# Patient Record
Sex: Male | Born: 1965 | Race: White | Hispanic: No | Smoking: Current every day smoker
Health system: Southern US, Community
[De-identification: ages and names within clinical notes are randomized; demographics above are authoritative.]

## PROBLEM LIST (undated history)

## (undated) ENCOUNTER — Inpatient Hospital Stay: Admission: EM | Payer: Self-pay | Source: Home / Self Care

## (undated) DIAGNOSIS — F419 Anxiety disorder, unspecified: Secondary | ICD-10-CM

## (undated) DIAGNOSIS — I2510 Atherosclerotic heart disease of native coronary artery without angina pectoris: Secondary | ICD-10-CM

## (undated) DIAGNOSIS — F32A Depression, unspecified: Secondary | ICD-10-CM

## (undated) DIAGNOSIS — I10 Essential (primary) hypertension: Secondary | ICD-10-CM

## (undated) HISTORY — DX: Depression, unspecified: F32.A

## (undated) HISTORY — DX: Essential (primary) hypertension: I10

---

## 2007-01-18 ENCOUNTER — Emergency Department (HOSPITAL_COMMUNITY): Admission: EM | Admit: 2007-01-18 | Discharge: 2007-01-18 | Payer: Self-pay | Admitting: Emergency Medicine

## 2012-04-25 NOTE — H&P (Signed)
  Assessment  . Peritonsillar abscess   (475) Discussed  History of what sounds like recurrent peritonsillar abscesses. The recommendation for that is tonsillectomy. We discussed the nature of the surgery and the recovery period. He will think about this and will let us know. Strongly recommend he stopped smoking. Reason For Visit  Levi Johnston is here today at the kind request of Lerry Liner for consultation and opinion for throat issues. HPI  History of what sounds like recurring peritonsillar abscess. One time he had it drained surgically and another time it drained spontaneously. He had a couple other episodes where he was treated before he got bad enough to drain. His last one was a couple of weeks ago. He smokes up to 2 packs per day. He doesn't really drink. Allergies  No Known Drug Allergies. Current Meds  No Reported Medications;; RPT. Family Hx  Family history of Cancer. Personal Hx  Behavioral: Current every day smoker   (305.1). Current Smoker (305.1) Never Drank Alcohol. ROS  Systemic: Fever. Otolaryngeal: Purulent nasal discharge, nasal passage blockage, and sore throat. Gastrointestinal: Dysphagia. 12 system ROS was obtained and reviewed on the Health Maintenance form dated today.  Positive responses are shown above.  If the symptom is not checked, the patient has denied it. Vital Signs   Recorded by [facility] on 06 Apr 2012 02:17 PM BP:100/60,  Height: 71 in, Weight: 185 lb, BMI: 25.8 kg/m2,  BSA Calculated: 2.04 ,  BMI Calculated: 25.80. Physical Exam  APPEARANCE: Well developed, well nourished, in no acute distress.  Normal affect, in a pleasant mood.  Oriented to time, place and person. COMMUNICATION: Normal voice   HEAD & FACE:  No scars, lesions or masses of head and face.  Sinuses nontender to palpation.  Salivary glands without mass or tenderness.  Facial strength symmetric.  No facial lesion, scars, or mass. EYES: EOMI with normal primary gaze  alignment. Visual acuity grossly intact.  PERRLA EXTERNAL EAR & NOSE: No scars, lesions or masses  EAC & TYMPANIC MEMBRANE:  EAC shows no obstructing lesions or debris and tympanic membranes are normal bilaterally with good movement to insufflation. GROSS HEARING: Normal   TMJ:  Nontender  INTRANASAL EXAM: No polyps or purulence.  NASOPHARYNX: Normal, without lesions. LIPS, TEETH & GUMS: No lip lesions, normal dentition and normal gums. ORAL CAVITY/OROPHARYNX:  Oral mucosa moist without lesion or asymmetry of the palate, tongue, tonsil or posterior pharynx. NECK:  Supple without adenopathy or mass. THYROID:  Normal with no masses palpable.  NEUROLOGIC:  No gross CN deficits. No nystagmus noted.   LYMPHATIC:  No enlarged nodes palpable.

## 2012-04-30 ENCOUNTER — Encounter (HOSPITAL_BASED_OUTPATIENT_CLINIC_OR_DEPARTMENT_OTHER): Payer: Self-pay | Admitting: *Deleted

## 2012-05-03 ENCOUNTER — Ambulatory Visit (HOSPITAL_BASED_OUTPATIENT_CLINIC_OR_DEPARTMENT_OTHER)
Admission: RE | Admit: 2012-05-03 | Discharge: 2012-05-03 | Disposition: A | Discharge status: Home / Self Care | Payer: 59 | Source: Ambulatory Visit | Attending: Otolaryngology | Admitting: Otolaryngology

## 2012-05-03 ENCOUNTER — Encounter (HOSPITAL_BASED_OUTPATIENT_CLINIC_OR_DEPARTMENT_OTHER): Payer: Self-pay | Admitting: Anesthesiology

## 2012-05-03 ENCOUNTER — Encounter (HOSPITAL_BASED_OUTPATIENT_CLINIC_OR_DEPARTMENT_OTHER)
Admission: RE | Disposition: A | Discharge status: Home / Self Care | Payer: Self-pay | Source: Ambulatory Visit | Attending: Otolaryngology

## 2012-05-03 ENCOUNTER — Ambulatory Visit (HOSPITAL_BASED_OUTPATIENT_CLINIC_OR_DEPARTMENT_OTHER): Payer: 59 | Admitting: Anesthesiology

## 2012-05-03 ENCOUNTER — Encounter (HOSPITAL_BASED_OUTPATIENT_CLINIC_OR_DEPARTMENT_OTHER): Payer: Self-pay | Admitting: *Deleted

## 2012-05-03 DIAGNOSIS — J3501 Chronic tonsillitis: Secondary | ICD-10-CM

## 2012-05-03 HISTORY — PX: TONSILLECTOMY: SHX5217

## 2012-05-03 LAB — POCT HEMOGLOBIN-HEMACUE: Hemoglobin: 17.1 g/dL — ABNORMAL HIGH (ref 13.0–17.0)

## 2012-05-03 SURGERY — TONSILLECTOMY
Anesthesia: General | Site: Throat | Wound class: Clean Contaminated

## 2012-05-03 MED ORDER — LIDOCAINE HCL (CARDIAC) 10 MG/ML IV SOLN
INTRAVENOUS | Status: DC | PRN
  Administered 2012-05-03: 70 mg via INTRAVENOUS

## 2012-05-03 MED ORDER — SUCCINYLCHOLINE CHLORIDE 20 MG/ML IJ SOLN
INTRAMUSCULAR | Status: DC | PRN
  Administered 2012-05-03: 100 mg via INTRAVENOUS

## 2012-05-03 MED ORDER — DEXAMETHASONE SODIUM PHOSPHATE 4 MG/ML IJ SOLN
INTRAMUSCULAR | Status: DC | PRN
  Administered 2012-05-03: 10 mg via INTRAVENOUS

## 2012-05-03 MED ORDER — HYDROCODONE-ACETAMINOPHEN 7.5-500 MG/15ML PO SOLN: 15.0000 mL | Freq: Four times a day (QID) | ORAL | Status: AC | PRN

## 2012-05-03 MED ORDER — DEXAMETHASONE SODIUM PHOSPHATE 10 MG/ML IJ SOLN: 10.0000 mg | INTRAMUSCULAR | Status: DC

## 2012-05-03 MED ORDER — METOCLOPRAMIDE HCL 5 MG/ML IJ SOLN
INTRAMUSCULAR | Status: DC | PRN
  Administered 2012-05-03: 10 mg via INTRAVENOUS

## 2012-05-03 MED ORDER — HYDROMORPHONE HCL PF 1 MG/ML IJ SOLN
0.2500 mg | INTRAMUSCULAR | Status: DC | PRN
  Administered 2012-05-03 (×2): 0.5 mg via INTRAVENOUS

## 2012-05-03 MED ORDER — MIDAZOLAM HCL 5 MG/5ML IJ SOLN
INTRAMUSCULAR | Status: DC | PRN
  Administered 2012-05-03: 2 mg via INTRAVENOUS

## 2012-05-03 MED ORDER — HYDROCODONE-ACETAMINOPHEN 7.5-500 MG/15ML PO SOLN
10.0000 mL | ORAL | Status: DC | PRN
  Administered 2012-05-03: 15 mL via ORAL

## 2012-05-03 MED ORDER — PROPOFOL 10 MG/ML IV EMUL
INTRAVENOUS | Status: DC | PRN
  Administered 2012-05-03: 170 mg via INTRAVENOUS

## 2012-05-03 MED ORDER — PROMETHAZINE HCL 25 MG PO TABS: 25.0000 mg | ORAL_TABLET | Freq: Four times a day (QID) | ORAL | Status: DC | PRN

## 2012-05-03 MED ORDER — DEXTROSE-NACL 5-0.9 % IV SOLN
INTRAVENOUS | Status: DC
  Administered 2012-05-03: 11:00:00 via INTRAVENOUS

## 2012-05-03 MED ORDER — 0.9 % SODIUM CHLORIDE (POUR BTL) OPTIME
TOPICAL | Status: DC | PRN
  Administered 2012-05-03: 1000 mL

## 2012-05-03 MED ORDER — PROMETHAZINE HCL 25 MG RE SUPP: 25.0000 mg | Freq: Four times a day (QID) | RECTAL | Status: DC | PRN

## 2012-05-03 MED ORDER — PHENOL 1.4 % MT LIQD
1.0000 | OROMUCOSAL | Status: DC | PRN
  Administered 2012-05-03: 1 via OROMUCOSAL

## 2012-05-03 MED ORDER — LACTATED RINGERS IV SOLN
INTRAVENOUS | Status: DC
  Administered 2012-05-03 (×3): via INTRAVENOUS

## 2012-05-03 MED ORDER — FENTANYL CITRATE 0.05 MG/ML IJ SOLN
INTRAMUSCULAR | Status: DC | PRN
  Administered 2012-05-03 (×2): 50 ug via INTRAVENOUS
  Administered 2012-05-03: 100 ug via INTRAVENOUS

## 2012-05-03 MED ORDER — ONDANSETRON HCL 4 MG/2ML IJ SOLN
INTRAMUSCULAR | Status: DC | PRN
  Administered 2012-05-03: 4 mg via INTRAVENOUS

## 2012-05-03 SURGICAL SUPPLY — 27 items
CANISTER SUCTION 1200CC (MISCELLANEOUS) ×2 IMPLANT
CATH ROBINSON RED A/P 12FR (CATHETERS) ×2 IMPLANT
CLOTH BEACON ORANGE TIMEOUT ST (SAFETY) ×2 IMPLANT
COAGULATOR SUCT SWTCH 10FR 6 (ELECTROSURGICAL) IMPLANT
COVER MAYO STAND STRL (DRAPES) ×2 IMPLANT
ELECT COATED BLADE 2.86 ST (ELECTRODE) ×2 IMPLANT
ELECT REM PT RETURN 9FT ADLT (ELECTROSURGICAL) ×2
ELECT REM PT RETURN 9FT PED (ELECTROSURGICAL)
ELECTRODE REM PT RETRN 9FT PED (ELECTROSURGICAL) IMPLANT
ELECTRODE REM PT RTRN 9FT ADLT (ELECTROSURGICAL) IMPLANT
GAUZE SPONGE 4X4 12PLY STRL LF (GAUZE/BANDAGES/DRESSINGS) ×2 IMPLANT
GLOVE BIO SURGEON STRL SZ 6.5 (GLOVE) ×1 IMPLANT
GLOVE ECLIPSE 7.5 STRL STRAW (GLOVE) ×2 IMPLANT
GOWN PREVENTION PLUS XLARGE (GOWN DISPOSABLE) ×2 IMPLANT
MARKER SKIN DUAL TIP RULER LAB (MISCELLANEOUS) IMPLANT
NS IRRIG 1000ML POUR BTL (IV SOLUTION) ×2 IMPLANT
PENCIL FOOT CONTROL (ELECTRODE) ×2 IMPLANT
SHEET MEDIUM DRAPE 40X70 STRL (DRAPES) ×2 IMPLANT
SOLUTION BUTLER CLEAR DIP (MISCELLANEOUS) IMPLANT
SPONGE TONSIL 1 RF SGL (DISPOSABLE) IMPLANT
SPONGE TONSIL 1.25 RF SGL STRG (GAUZE/BANDAGES/DRESSINGS) IMPLANT
SYR BULB 3OZ (MISCELLANEOUS) ×2 IMPLANT
TOWEL OR 17X24 6PK STRL BLUE (TOWEL DISPOSABLE) ×2 IMPLANT
TUBE CONNECTING 20X1/4 (TUBING) ×2 IMPLANT
TUBE SALEM SUMP 12R W/ARV (TUBING) IMPLANT
TUBE SALEM SUMP 16 FR W/ARV (TUBING) ×1 IMPLANT
WATER STERILE IRR 1000ML POUR (IV SOLUTION) ×1 IMPLANT

## 2012-05-03 NOTE — Anesthesia Postprocedure Evaluation (Signed)
  Anesthesia Post-op Note  Patient: Levi Johnston  Procedure(s) Performed: Procedure(s) (LRB): TONSILLECTOMY (N/A)  Patient Location: PACU  Anesthesia Type: General  Level of Consciousness: awake  Airway and Oxygen Therapy: Patient Spontanous Breathing and aerosol face mask  Post-op Pain: mild  Post-op Assessment: Post-op Vital signs reviewed, Patient's Cardiovascular Status Stable, Respiratory Function Stable, Patent Airway and No signs of Nausea or vomiting  Post-op Vital Signs: Reviewed and stable  Complications: No apparent anesthesia complications

## 2012-05-03 NOTE — Op Note (Signed)
05/03/2012  8:15 AM  PATIENT:  Levi Johnston  46 y.o. male  PRE-OPERATIVE DIAGNOSIS:  CHRONIC TONSILITIS  POST-OPERATIVE DIAGNOSIS:  CHRONIC TONSILITIS  PROCEDURE:  Procedure(s): TONSILLECTOMY  SURGEON:  Surgeon(s): Serena Colonel, MD  ANESTHESIA:   general  COUNTS:  YES   DICTATION: The patient was taken to the operating room and placed on the operating table in the supine position. Following induction of general endotracheal anesthesia, the table was turned and the patient was draped in a standard fashion. A Crowe-Davis mouthgag was inserted into the oral cavity and used to retract the tongue and mandible, then attached to the Mayo stand.  The tonsillectomy was then performed using electrocautery dissection, carefully dissecting the avascular plane between the capsule and constrictor muscles. The tonsils were large, deeply cryptic with debris present. There were also severely fibrosed to the constrictor muscles.Cautery was used for completion of hemostasis. The tonsils were sent for pathologic evaluation given the patient's age, the smoking status and the abnormal findings.  The pharynx was irrigated with saline and suctioned. An oral gastric tube was used to aspirate the contents of the stomach. The patient was then awakened from anesthesia and transferred to PACU in stable condition.   PATIENT DISPOSITION:  PACU - hemodynamically stable.

## 2012-05-03 NOTE — Anesthesia Preprocedure Evaluation (Signed)
Anesthesia Evaluation  Patient identified by MRN, date of birth, ID band Patient awake    Reviewed: Allergy & Precautions, H&P , NPO status , Patient's Chart, lab work & pertinent test results  Airway Mallampati: II TM Distance: >3 FB Neck ROM: Full    Dental No notable dental hx. (+) Teeth Intact and Dental Advisory Given   Pulmonary neg pulmonary ROS,  breath sounds clear to auscultation  Pulmonary exam normal       Cardiovascular negative cardio ROS  Rhythm:Regular Rate:Normal     Neuro/Psych negative neurological ROS  negative psych ROS   GI/Hepatic negative GI ROS, Neg liver ROS,   Endo/Other  negative endocrine ROS  Renal/GU negative Renal ROS  negative genitourinary   Musculoskeletal   Abdominal   Peds  Hematology negative hematology ROS (+)   Anesthesia Other Findings   Reproductive/Obstetrics negative OB ROS                           Anesthesia Physical Anesthesia Plan  ASA: I  Anesthesia Plan: General   Post-op Pain Management:    Induction: Intravenous  Airway Management Planned: Oral ETT  Additional Equipment:   Intra-op Plan:   Post-operative Plan: Extubation in OR  Informed Consent: I have reviewed the patients History and Physical, chart, labs and discussed the procedure including the risks, benefits and alternatives for the proposed anesthesia with the patient or authorized representative who has indicated his/her understanding and acceptance.   Dental advisory given  Plan Discussed with: CRNA  Anesthesia Plan Comments:         Anesthesia Quick Evaluation  

## 2012-05-03 NOTE — Interval H&P Note (Signed)
History and Physical Interval Note:  05/03/2012 7:26 AM  Levi Johnston  has presented today for surgery, with the diagnosis of CHRONIC TONSILITIS  The various methods of treatment have been discussed with the patient and family. After consideration of risks, benefits and other options for treatment, the patient has consented to  Procedure(s) (LRB): TONSILLECTOMY (N/A) as a surgical intervention .  The patient's history has been reviewed, patient examined, no change in status, stable for surgery.  I have reviewed the patient's chart and labs.  Questions were answered to the patient's satisfaction.     Devoiry Corriher

## 2012-05-03 NOTE — Progress Notes (Signed)
Attempted room air with oxygen sat's running in 90-91 range.  Patient placed on 2 liters oxygen.

## 2012-05-03 NOTE — Anesthesia Procedure Notes (Signed)
Procedure Name: Intubation Date/Time: 05/03/2012 7:45 AM Performed by: Gar Gibbon Pre-anesthesia Checklist: Patient identified, Emergency Drugs available, Suction available and Patient being monitored Patient Re-evaluated:Patient Re-evaluated prior to inductionOxygen Delivery Method: Circle System Utilized Preoxygenation: Pre-oxygenation with 100% oxygen Intubation Type: IV induction Ventilation: Mask ventilation without difficulty Laryngoscope Size: Miller and 3 Grade View: Grade II Tube type: Oral Tube size: 7.0 mm Number of attempts: 1 Airway Equipment and Method: stylet and oral airway Placement Confirmation: ETT inserted through vocal cords under direct vision,  positive ETCO2 and breath sounds checked- equal and bilateral Tube secured with: Tape Dental Injury: Teeth and Oropharynx as per pre-operative assessment

## 2012-05-03 NOTE — Progress Notes (Signed)
Dr. Sampson Goon notified of patient's oxygen saturation and need for 2 liters nasal cannula.  Patient to be transferred to [facility] on nasal cannula.

## 2012-05-03 NOTE — Transfer of Care (Signed)
Immediate Anesthesia Transfer of Care Note  Patient: Levi Johnston  Procedure(s) Performed: Procedure(s) (LRB): TONSILLECTOMY (N/A)  Patient Location: PACU  Anesthesia Type: General  Level of Consciousness: sedated and patient cooperative  Airway & Oxygen Therapy: aerosol face mask  Post-op Assessment: Report given to PACU RN and Post -op Vital signs reviewed and stable  Post vital signs: Reviewed and stable  Complications: No apparent anesthesia complications

## 2012-05-04 ENCOUNTER — Encounter (HOSPITAL_BASED_OUTPATIENT_CLINIC_OR_DEPARTMENT_OTHER): Payer: Self-pay | Admitting: Otolaryngology

## 2017-09-13 ENCOUNTER — Encounter (HOSPITAL_COMMUNITY): Payer: Self-pay | Admitting: Emergency Medicine

## 2017-09-13 ENCOUNTER — Emergency Department (HOSPITAL_COMMUNITY): Payer: Medicaid Other

## 2017-09-13 ENCOUNTER — Other Ambulatory Visit: Payer: Self-pay

## 2017-09-13 ENCOUNTER — Emergency Department (HOSPITAL_COMMUNITY)
Admission: EM | Admit: 2017-09-13 | Discharge: 2017-09-13 | Disposition: A | Discharge status: Home / Self Care | Payer: Medicaid Other | Attending: Physician Assistant | Admitting: Physician Assistant

## 2017-09-13 DIAGNOSIS — F17210 Nicotine dependence, cigarettes, uncomplicated: Secondary | ICD-10-CM | POA: Diagnosis not present

## 2017-09-13 DIAGNOSIS — R1032 Left lower quadrant pain: Secondary | ICD-10-CM | POA: Insufficient documentation

## 2017-09-13 DIAGNOSIS — F17228 Nicotine dependence, chewing tobacco, with other nicotine-induced disorders: Secondary | ICD-10-CM | POA: Insufficient documentation

## 2017-09-13 DIAGNOSIS — R1030 Lower abdominal pain, unspecified: Secondary | ICD-10-CM | POA: Diagnosis present

## 2017-09-13 MED ORDER — OXYCODONE-ACETAMINOPHEN 5-325 MG PO TABS
1.0000 | ORAL_TABLET | Freq: Once | ORAL | Status: AC
  Administered 2017-09-13: 1 via ORAL
  Filled 2017-09-13: qty 1

## 2017-09-13 MED ORDER — OXYCODONE-ACETAMINOPHEN 5-325 MG PO TABS
1.0000 | ORAL_TABLET | Freq: Once | ORAL | Status: AC
Start: 2017-09-13 — End: 2017-09-13
  Administered 2017-09-13: 1 via ORAL
  Filled 2017-09-13: qty 1

## 2017-09-13 MED ORDER — SULFAMETHOXAZOLE-TRIMETHOPRIM 800-160 MG PO TABS: 1.0000 | ORAL_TABLET | Freq: Two times a day (BID) | ORAL | 0 refills | Status: AC

## 2017-09-13 MED ORDER — LIDOCAINE HCL (PF) 1 % IJ SOLN
5.0000 mL | Freq: Once | INTRAMUSCULAR | Status: AC
  Administered 2017-09-13: 5 mL via INTRADERMAL
  Filled 2017-09-13: qty 5

## 2017-09-13 NOTE — ED Notes (Signed)
Patient able to ambulate independently  

## 2017-09-13 NOTE — ED Provider Notes (Signed)
[facility] EMERGENCY [facility]PARTMENT Provider Note   CSN: [id number] Arrival date & time: 09/13/17  [id number]     History   Chief Complaint Chief Complaint  Patient presents with  . Groin Pain    HPI Levi Johnston is a 51 y.o. male.  HPI   The patient is a 51 year old male presenting with a left groin pain.  Patient reports there is been some redness there and swelling.  He reports it hurts when he moves.  Does not get worse with increased intra-abdominal pressure such as coughing or straining.  Patient has noticed that steadily worse, it does not hurt so bad when he is not moving or he is not touching it.  Patient is concerned over a strangled hernia.  No urinary or bowel complaints.  No fevers.  History reviewed. No pertinent past medical history.  There are no active problems to display for this patient.   Past Surgical History:  Procedure Laterality Date  . TONSILLECTOMY  05/03/2012   Procedure: TONSILLECTOMY;  Surgeon: Serena ColonelJefry Rosen, MD;  Location: [facility];  Service: ENT;  Laterality: N/A;       Home Medications    Prior to Admission medications   Medication Sig Start Date End Date Taking? Authorizing Provider  promethazine (PHENERGAN) 25 MG suppository Place 1 suppository (25 mg total) rectally every 6 (six) hours as needed for nausea. 05/03/12 05/10/12  Serena Colonelosen, Jefry, MD    Family History No family history on file.  Social History Social History   Tobacco Use  . Smoking status: Current Every Day Smoker    Packs/day: 1.00    Types: Cigarettes  . Smokeless tobacco: Current User  Substance Use Topics  . Alcohol use: Yes    Comment: drinks very rare  . Drug use: No     Allergies   Patient has no known allergies.   Review of Systems Review of Systems  Constitutional: Negative for activity change.  Respiratory: Negative for shortness of breath.   Cardiovascular: Negative for chest pain.  Gastrointestinal: Negative for  abdominal pain.  Skin: Positive for rash.  All other systems reviewed and are negative.    Physical Exam Updated Vital Signs BP (!) 150/104 (BP Location: Right Arm)   Pulse (!) 106   Temp 98 F (36.7 C) (Oral)   Ht 6' (1.829 m)   Wt 90.7 kg (200 lb)   SpO2 99%   BMI 27.12 kg/m   Physical Exam  Constitutional: He is oriented to person, place, and time. He appears well-nourished.  HENT:  Head: Normocephalic.  Eyes: Conjunctivae are normal.  Cardiovascular: Normal rate and regular rhythm.  No murmur heard. Pulmonary/Chest: Effort normal and breath sounds normal. No respiratory distress.  Abdominal: Soft. He exhibits no distension. There is no tenderness.  Left groin with erythema, swelling.  Indurated.  No pain to the scrotum.  No pain with near internal ring of inguinal hernia.  Neurological: He is oriented to person, place, and time.  Skin: Skin is warm and dry. He is not diaphoretic.  Psychiatric: He has a normal mood and affect. His behavior is normal.     ED Treatments / Results  Labs (all labs ordered are listed, but only abnormal results are displayed) Labs Reviewed - No data to display  EKG  EKG Interpretation None       Radiology No results found.  Procedures Procedures (including critical care time)  Medications Ordered in ED Medications  oxyCODONE-acetaminophen (PERCOCET/ROXICET) 5-325 MG per  tablet 1 tablet (not administered)  lidocaine (PF) (XYLOCAINE) 1 % injection 5 mL (not administered)  oxyCODONE-acetaminophen (PERCOCET/ROXICET) 5-325 MG per tablet 1 tablet (1 tablet Oral Given 09/13/17 0809)     Initial Impression / Assessment and Plan / ED Course  I have reviewed the triage vital signs and the nursing notes.  Pertinent labs & imaging results that were available during my care of the patient were reviewed by me and considered in my medical decision making (see chart for details).     EMERGENCY DEPARTMENT US SOFT TISSUE  INTERPRETATION "Study: Limited Soft Tissue Ultrasound"  INDICATIONS: Soft tissue infection Multiple views of the body part were obtained in real-time with a multi-frequency linear probe PERFORMED BY:  Myself IMAGES ARCHIVED?: Yes SIDE:Left BODY PART:Abdominal wall and groin FINDINGS: Cellulitis present INTERPRETATION:  Abcess present  The patient is a 51 year old male presenting with a left groin pain.  Patient reports there is been some redness there and swelling.  He reports it hurts when he moves.  Does not get worse with increased intra-abdominal pressure such as coughing or straining.  Patient has noticed that steadily worse, it does not hurt so bad when he is not moving or he is not touching it.  Patient is concerned over a strangled hernia.  No urinary or bowel complaints.  No fevers.  1:16 PM It is difficult to tell by physical exam whether this is just an abscess/overlying cellulitis versus a has a hernia component to it.  Difficult to tell because patient's sensitivity to touch.  Ultrasound shows cobblestoning which could be present with a infected strangulated hernia.  Will get CT.\\ 3:22 PM CT showed cellulitis.  No hernia in that area.  It showed no clear pocket of infection.  However on physical exam and very much feels like there is a abscess.  I&D performed with copious amounts of purulent milked.  INCISION AND DRAINAGE Performed by: Arlana Hoveourteney L Emeline Simpson Consent: Verbal consent obtained. Risks and benefits: risks, benefits and alternatives were discussed Type: abscess  Body area: L groin  Anesthesia: local infiltration  Incision was made with a scalpel.  Local anesthetic: lidocaine 1 w epinephrine  Anesthetic total: 9 ml  Complexity: complex Blunt dissection to break up loculations  Drainage: purulent  Drainage amount: tons  Patient tolerance: Patient tolerated the procedure well with no immediate complications.     Final Clinical Impressions(s) / ED  Diagnoses   Final diagnoses:  None    ED Discharge Orders    None       Abelino DerrickMackuen, Cynthie Garmon Lyn, MD 09/13/17 [id number]

## 2017-09-13 NOTE — ED Notes (Signed)
EDP at bedside at this time.  

## 2017-09-13 NOTE — ED Notes (Signed)
Pt transported to CT ?

## 2017-09-13 NOTE — ED Triage Notes (Signed)
Pt. Stated, I've had left groin pain like having a hernia for 10 days. Its really painful

## 2017-09-13 NOTE — Discharge Instructions (Signed)
Use warm compresses twice daily for the next several days.  Should use antibiotics and return if your redness is worsening.

## 2018-04-12 ENCOUNTER — Emergency Department (HOSPITAL_COMMUNITY)
Admission: EM | Admit: 2018-04-12 | Discharge: 2018-04-12 | Disposition: A | Discharge status: Home / Self Care | Payer: Medicaid Other | Attending: Emergency Medicine | Admitting: Emergency Medicine

## 2018-04-12 ENCOUNTER — Other Ambulatory Visit: Payer: Self-pay

## 2018-04-12 ENCOUNTER — Encounter (HOSPITAL_COMMUNITY): Payer: Self-pay | Admitting: Emergency Medicine

## 2018-04-12 ENCOUNTER — Emergency Department (HOSPITAL_COMMUNITY): Payer: Medicaid Other

## 2018-04-12 DIAGNOSIS — R591 Generalized enlarged lymph nodes: Secondary | ICD-10-CM | POA: Insufficient documentation

## 2018-04-12 DIAGNOSIS — F17210 Nicotine dependence, cigarettes, uncomplicated: Secondary | ICD-10-CM | POA: Diagnosis not present

## 2018-04-12 DIAGNOSIS — R221 Localized swelling, mass and lump, neck: Secondary | ICD-10-CM | POA: Diagnosis not present

## 2018-04-12 DIAGNOSIS — R599 Enlarged lymph nodes, unspecified: Secondary | ICD-10-CM | POA: Diagnosis not present

## 2018-04-12 LAB — CBC
HCT: 49.2 % (ref 39.0–52.0)
Hemoglobin: 16.2 g/dL (ref 13.0–17.0)
MCH: 29.1 pg (ref 26.0–34.0)
MCHC: 32.9 g/dL (ref 30.0–36.0)
MCV: 88.5 fL (ref 78.0–100.0)
PLATELETS: 282 10*3/uL (ref 150–400)
RBC: 5.56 MIL/uL (ref 4.22–5.81)
RDW: 14.1 % (ref 11.5–15.5)
WBC: 7.4 10*3/uL (ref 4.0–10.5)

## 2018-04-12 LAB — BASIC METABOLIC PANEL
ANION GAP: 9 (ref 5–15)
BUN: 16 mg/dL (ref 6–20)
CO2: 24 mmol/L (ref 22–32)
Calcium: 9.2 mg/dL (ref 8.9–10.3)
Chloride: 107 mmol/L (ref 98–111)
Creatinine, Ser: 1.16 mg/dL (ref 0.61–1.24)
GLUCOSE: 151 mg/dL — AB (ref 70–99)
Potassium: 3.9 mmol/L (ref 3.5–5.1)
Sodium: 140 mmol/L (ref 135–145)

## 2018-04-12 MED ORDER — IOHEXOL 300 MG/ML  SOLN
75.0000 mL | Freq: Once | INTRAMUSCULAR | Status: AC | PRN
  Administered 2018-04-12: 75 mL via INTRAVENOUS

## 2018-04-12 NOTE — ED Provider Notes (Signed)
[facility] Emergency Department Provider Note MRN:  [id number]  Arrival date & time: 04/13/18     Chief Complaint   Mass   History of Present Illness   Levi Johnston is a 52 y.o. year-old male with a history of tobacco abuse presenting to the ED with chief complaint of neck mass.  Patient explains that 5 days ago while he was shaving he noticed an abnormal contour to the left side of his neck.  Upon palpation he noticed a small mass.  This mass was initially fluctuant but has become more solid over the past 5 days.  Mass associated with some mild tenderness to palpation, described as a dull pain.  Patient denies fevers, night sweats, no increased fatigue, no unintentional weight loss.  Patient's mother died of cancer and patient's brother had a similar nodule in the exact same location and was diagnosed with head neck cancer a year and half ago, and the patient has witnessed his brother struggle with multiple surgeries and chemotherapy.  Review of Systems  A complete 10 system review of systems was obtained and all systems are negative except as noted in the HPI and PMH.   Patient's Health History   History reviewed. No pertinent past medical history.  Past Surgical History:  Procedure Laterality Date  . TONSILLECTOMY  05/03/2012   Procedure: TONSILLECTOMY;  Surgeon: Serena Colonel, MD;  Location: [facility];  Service: ENT;  Laterality: N/A;    No family history on file.  Social History   Socioeconomic History  . Marital status: Divorced    Spouse name: Not on file  . Number of children: Not on file  . Years of education: Not on file  . Highest education level: Not on file  Occupational History  . Not on file  Social Needs  . Financial resource strain: Not on file  . Food insecurity:    Worry: Not on file    Inability: Not on file  . Transportation needs:    Medical: Not on file    Non-medical: Not on file  Tobacco Use  . Smoking status:  Current Every Day Smoker    Packs/day: 1.00    Types: Cigarettes  . Smokeless tobacco: Current User  Substance and Sexual Activity  . Alcohol use: Yes    Comment: drinks very rare  . Drug use: No  . Sexual activity: Not on file    Comment: smokes  1 pack a day  Lifestyle  . Physical activity:    Days per week: Not on file    Minutes per session: Not on file  . Stress: Not on file  Relationships  . Social connections:    Talks on phone: Not on file    Gets together: Not on file    Attends religious service: Not on file    Active member of club or organization: Not on file    Attends meetings of clubs or organizations: Not on file    Relationship status: Not on file  . Intimate partner violence:    Fear of current or ex partner: Not on file    Emotionally abused: Not on file    Physically abused: Not on file    Forced sexual activity: Not on file  Other Topics Concern  . Not on file  Social History Narrative  . Not on file     Physical Exam  Vital Signs and Nursing Notes reviewed Vitals:   04/12/18 1900 04/12/18 2030  BP: Marland Kitchen)  132/98 (!) 159/98  Pulse: 77 77  Resp:    Temp:    SpO2: 99% 98%    CONSTITUTIONAL: Well-appearing, NAD NEURO:  Alert and oriented x 3, no focal deficits EYES:  eyes equal and reactive ENT/NECK:  no LAD, no JVD; 2 to 3 cm semisolid irregular nodule to the left lateral neck soft tissue CARDIO: Regular rate, well-perfused, normal S1 and S2 PULM:  CTAB no wheezing or rhonchi GI/GU:  normal bowel sounds, non-distended, non-tender MSK/SPINE:  No gross deformities, no edema SKIN:  no rash, atraumatic PSYCH:  Appropriate speech and behavior  Diagnostic and Interventional Summary    EKG Interpretation  Date/Time:    Ventricular Rate:    PR Interval:    QRS Duration:   QT Interval:    QTC Calculation:   R Axis:     Text Interpretation:        Labs Reviewed  BASIC METABOLIC PANEL - Abnormal; Notable for the following components:       Result Value   Glucose, Bld 151 (*)    All other components within normal limits  CBC    CT Soft Tissue Neck W Contrast  Final Result      Medications  iohexol (OMNIPAQUE) 300 MG/ML solution 75 mL (75 mLs Intravenous Contrast Given 04/12/18 1927)     Procedures Critical Care  ED Course and Medical Decision Making  I have reviewed the triage vital signs and the nursing notes.  Pertinent labs & imaging results that were available during my care of the patient were reviewed by me and considered in my medical decision making (see below for details). Clinical Course as of Apr 13 21  Mon Apr 12, 2018  [id number] 52 year old male history of tobacco abuse presenting with mass to the left lateral neck, changing in texture from soft to more solid in the past 5 days.  No B symptoms, vital signs stable, afebrile here.  3 to 4 cm lesion with irregular borders, concerning for possible neoplastic lesion.  Will obtain CT imaging today.   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  Discussed findings and imaging with Dr. Annalee GentaShoemaker of ENT, no indication for admission at this time, will see in clinic within the next 2 to 3 days.  Patient informed of the concerning findings and is aware that he needs to [facility] for [facility] this week.After the discussed management above, the patient was determined to be safe for discharge.  The patient was in agreement with this plan and all questions regarding their care were answered.  ED return precautions were discussed and the patient will return to the ED with any significant worsening of condition.   [MB]    Clinical Course User Index [MB] Pilar PlateBero, Elmer SowMichael M, MD     Elmer SowMichael M. Pilar PlateBero, MD [facility] [email or web address]  Final Clinical Impressions(s) / ED Diagnoses     ICD-10-CM   1. Neck mass R22.1   2. Lymphadenopathy R59.1     ED  Discharge Orders    None         Sabas SousBero, Paityn Balsam M, MD 04/13/18 [phone]

## 2018-04-12 NOTE — Discharge Instructions (Addendum)
[facility].  After careful evaluation, we did not find any emergent condition requiring admission or further testing in the hospital.  Your symptoms today seem to be due to an enlarged lymph node that was found to be irregular on the CT and is concerning for an underlying cancer.  Your symptoms and your CT scan were discussed with Dr. Annalee GentaShoemaker, an ENT surgeon.  He and his colleagues would like to see you at [facility] within the next 2 to 3 days.  This clinic is located on [address]  The phone number is [phone].  Please call this number tomorrow morning to schedule an appointment.  Should be expecting your call.  Please return to the Emergency Department if you experience any worsening of your condition.  We encourage you to follow up with a primary care provider.  Thank you for allowing us to be a part of your care.

## 2018-04-12 NOTE — ED Triage Notes (Signed)
Patient complains of painful mass he noticed a few days ago posterior to his left jaw. States it has gotten larger and harder since it appeared. Patient states he is concerned because many members of his family have cancer and he is concerned me may also have cancer. Patient alert, oriented, and in no apparent distress at this time.

## 2018-04-12 NOTE — ED Provider Notes (Signed)
Patient placed in Quick Look pathway, seen and evaluated   Chief Complaint: Neck mass  HPI:   Pt reports over the last few days he has noticed a mass over the left side of the neck that is tender to palpation and seem to gotten a little bit bigger over the past few days.  No sore throat, no difficulty breathing or swallowing.  Reports family history of head and neck cancers.  ROS: + neck mass. -Fevers, chills, trouble breathing or swallowing, sore throat  Physical Exam:   Gen: No distress  Neuro: Awake and Alert  Skin: Warm    Focused Exam: Palpable mass over the left side of the neck minimally tender to palpation, no overlying redness or swelling.  Posterior oropharynx is clear and moist, no stridor tolerating secretions, normal phonation   Initiation of care has begun. The patient has been counseled on the process, plan, and necessity for staying for the completion/evaluation, and the remainder of the medical screening examination    Legrand RamsFord, Levi Kirksey N, PA-C 04/12/18 1435    Shaune PollackIsaacs, Cameron, MD 04/12/18 1650

## 2018-04-12 NOTE — ED Notes (Signed)
Pt stable, ambulatory, states understanding of discharge instructions 

## 2018-04-12 NOTE — ED Notes (Signed)
Called CT and spoke with tomasina regarding time of pt's test that has been pending for over 4 hours

## 2018-04-12 NOTE — ED Notes (Signed)
Patient transported to CT 

## 2018-04-16 DIAGNOSIS — R590 Localized enlarged lymph nodes: Secondary | ICD-10-CM | POA: Diagnosis not present

## 2018-04-27 DIAGNOSIS — R590 Localized enlarged lymph nodes: Secondary | ICD-10-CM | POA: Diagnosis not present

## 2018-05-11 DIAGNOSIS — R590 Localized enlarged lymph nodes: Secondary | ICD-10-CM | POA: Diagnosis not present

## 2018-05-12 NOTE — H&P (Signed)
  Otolaryngology Clinic Note  HPI:    Levi Johnston is a 52 y.o. male patient of Patient Does Not Have Pcp for preoperative evaluation.  Thus far, fine-needle aspiration abscesses x2 failed to show any sign of cancer.  He does continue to smoke.  We will excise the cystic lesion and send it for frozen section.  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  If it is positive, then we should do a panendoscopy looking for the primary, and also a limited neck dissection.  I discussed this in detail including risks and complications.  Questions were answered and informed consent was obtained. PMH/Meds/All/SocHx/FamHx/ROS:   Past Medical History  History reviewed. No pertinent past medical history.    Past Surgical History       Past Surgical History:  Procedure Laterality Date  . NO PAST SURGERIES        No family history of bleeding disorders, wound healing problems or difficulty with anesthesia.   Social History  Social History        Social History  . Marital status: Divorced    Spouse name: N/A  . Number of children: N/A  . Years of education: N/A      Occupational History  . Not on file.        Social History Main Topics  . Smoking status: Current Every Day Smoker    Types: Cigarettes  . Smokeless tobacco: Never Used  . Alcohol use No  . Drug use: No  . Sexual activity: Not on file       Other Topics Concern  . Not on file      Social History Narrative  . No narrative on file       Current Outpatient Prescriptions:  .  HYDROcodone-acetaminophen (NORCO) 5-325 mg per tablet, Take 1-2 tablets by mouth every 4 (four) hours as needed for up to 5 days for Pain., Disp: 24 tablet, Rfl: 0  A complete ROS was performed with pertinent positives/negatives noted in the HPI. The remainder of the ROS are negative.    Physical Exam:    There were no vitals taken for this visit. He is muscular and  healthy.  He smells of tobacco smoke.  Mental status is appropriate.  Tonsils are surgically absent.  There is fullness of the left level 2 neck and no other nodes. Lungs: Clear to auscultation Heart: Regular rate and rhythm without murmurs Abdomen: Soft, active Extremities: Normal configuration Neurologic: Symmetric, grossly intact.       Impression & Plans:   Satisfactory preop check.  Plan: We will proceed as planned.  Send in some hydrocodone.  He will buy some Hibiclens.  I will see him here 2 weeks after surgery.   Fernande BoydenKarol Thaddeus Cheron Coryell, MD  05/11/2018

## 2018-05-14 ENCOUNTER — Encounter (HOSPITAL_COMMUNITY)
Admission: RE | Admit: 2018-05-14 | Discharge: 2018-05-14 | Disposition: A | Discharge status: Home / Self Care | Payer: Medicaid Other | Source: Ambulatory Visit | Attending: Otolaryngology | Admitting: Otolaryngology

## 2018-05-14 ENCOUNTER — Other Ambulatory Visit: Payer: Self-pay

## 2018-05-14 ENCOUNTER — Encounter (HOSPITAL_COMMUNITY): Payer: Self-pay

## 2018-05-14 DIAGNOSIS — F17200 Nicotine dependence, unspecified, uncomplicated: Secondary | ICD-10-CM | POA: Diagnosis not present

## 2018-05-14 DIAGNOSIS — Z01810 Encounter for preprocedural cardiovascular examination: Secondary | ICD-10-CM | POA: Insufficient documentation

## 2018-05-14 DIAGNOSIS — Z01812 Encounter for preprocedural laboratory examination: Secondary | ICD-10-CM | POA: Diagnosis present

## 2018-05-14 DIAGNOSIS — I10 Essential (primary) hypertension: Secondary | ICD-10-CM | POA: Diagnosis not present

## 2018-05-14 DIAGNOSIS — L728 Other follicular cysts of the skin and subcutaneous tissue: Secondary | ICD-10-CM | POA: Diagnosis not present

## 2018-05-14 HISTORY — DX: Anxiety disorder, unspecified: F41.9

## 2018-05-14 LAB — BASIC METABOLIC PANEL
Anion gap: 9 (ref 5–15)
BUN: 18 mg/dL (ref 6–20)
CALCIUM: 9.3 mg/dL (ref 8.9–10.3)
CO2: 25 mmol/L (ref 22–32)
CREATININE: 1.17 mg/dL (ref 0.61–1.24)
Chloride: 104 mmol/L (ref 98–111)
GFR calc non Af Amer: >60 mL/min (ref >60)
Glucose, Bld: 114 mg/dL — ABNORMAL HIGH (ref 70–99)
Potassium: 4.4 mmol/L (ref 3.5–5.1)
Sodium: 138 mmol/L (ref 135–145)

## 2018-05-14 LAB — CBC
HCT: 51.6 % (ref 39.0–52.0)
Hemoglobin: 16.9 g/dL (ref 13.0–17.0)
MCH: 29.5 pg (ref 26.0–34.0)
MCHC: 32.8 g/dL (ref 30.0–36.0)
MCV: 90.1 fL (ref 78.0–100.0)
PLATELETS: 256 10*3/uL (ref 150–400)
RBC: 5.73 MIL/uL (ref 4.22–5.81)
RDW: 14.2 % (ref 11.5–15.5)
WBC: 8.8 10*3/uL (ref 4.0–10.5)

## 2018-05-14 NOTE — Progress Notes (Signed)
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 Pertinent hx includes anxiety.  I was called to see pt during PAT appt due to elevated BP. Check on arrival by automatic cuff 173/144. Manual check after resting 158/96. Review of BP from ED visit 04/12/18 also shows elevated at 159/98. I discussed with pt that he should establish with primary care as he likely has untreated HTN. I also advised that if his pressure is markedly elevated on DOS his procedure may need to be postponed. He verbalized understanding. He also stated he is under a tremendous amount of stress as both his mother and brother died from head and neck CA and he is very worried that his neck mass may represent cancer. He is a single father and says for the last month he has been smoking more and not sleeping much due to stress. He says he has been doing some walking/running lately trying to improve his fitness prior to surgery. He denies any CP with strenuous activity.  I left a message for Dr. Raye SorrowWolicki's assistant to make him aware of pt's untreated HTN.  Anticipate he can proceed with surgery as planned barring acute status change and BP acceptable on DOS.  VS: Pulse 90   Temp 36.8 C   Resp 20   Ht 5\' 11"  (1.803 m)   Wt 94.5 kg   SpO2 99%   BMI 29.07 kg/m   PROVIDERS: Patient, No Pcp Per   LABS: Labs reviewed: Acceptable for surgery. (all labs ordered are listed, but only abnormal results are displayed)  Labs Reviewed  CBC  BASIC METABOLIC PANEL     IMAGES: CT Soft Tissue Neck 04/12/2018: IMPRESSION: Hypodense, enlarged left level 2A cervical lymph node is concerning for necrotic  nodal metastasis from occult head and neck malignancy. Endoscopic assessment is recommended. No primary neoplasm identifiable by imaging.    EKG: 05/14/2018: Normal sinus rhythm. Inferior infarct, age undetermined    CV: N/A  Past Medical History:  Diagnosis Date  . Anxiety     Past Surgical History:  Procedure Laterality Date  . TONSILLECTOMY  05/03/2012   Procedure: TONSILLECTOMY;  Surgeon: Serena ColonelJefry Rosen, MD;  Location: [facility];  Service: ENT;  Laterality: N/A;    MEDICATIONS: No current outpatient medications on file.   No current facility-administered medications for this encounter.     Zannie CoveJames Brennen Gardiner, PA-C [facility]Anesthesiology Phone [phone] 05/14/2018 12:51 PM

## 2018-05-14 NOTE — Pre-Procedure Instructions (Signed)
Randall Colden  05/14/2018      [facility] - [place], [place] - [address] [place] [address] Phone: [phone] Fax: [phone]    Your procedure is scheduled on 05-18-2018 Tuesday .  Report to [facility] Admitting at 6:30 A.M.   Call this number if you have problems the morning of surgery:  [phone]   Remember:  Do not eat  Food or drink liquids after midnight.                        Take these medicines the morning of surgery with A SIP OF WATER none   STOP TAKING ANY ASPIRIN (UNLESS OTHERWISE INSTRUCTED BY YOUR SURGEON),ANTIINFLAMATORIES (IBUPROFEN,ALEVE,MOTRIN,ADVIL,GOODY'S POWDERS),HERBAL SUPPLEMENTS,FISH OIL,AND VITAMINS 5-7 DAYS PRIOR TO SURGERY    Do not wear jewelry  Do not wear lotions, powders, or perfumes, or deodorant.  Do not shave 48 hours prior to surgery.  Men may shave face and neck.   Do not bring valuables to the hospital.   [facility] is not responsible for any belongings or valuables.  Contacts, dentures or bridgework may not be worn into surgery.  Leave your suitcase in the car.  After surgery it may be brought to your room.  For patients admitted to the hospital, discharge time will be determined by your treatment team.  Patients discharged the day of surgery will not be allowed to drive home.    [facility] - Preparing for Surgery  Before surgery, you can play an important role.  Because skin is not sterile, your skin needs to be as free of germs as possible.  You can reduce the number of germs on you skin by washing with CHG (chlorahexidine gluconate) soap before surgery.  CHG is an antiseptic cleaner which kills germs and bonds with the skin to continue killing germs even after washing.  Oral Hygiene is also important in reducing the risk of infection.  Remember to brush your teeth with your regular toothpaste the morning of surgery.  Please DO NOT use if you have  an allergy to CHG or antibacterial soaps.  If your skin becomes reddened/irritated stop using the CHG and inform your nurse when you arrive at Short Stay.  Do not shave (including legs and underarms) for at least 48 hours prior to the first CHG shower.  You may shave your face.  Please follow these instructions carefully:   1.  Shower with CHG Soap the night before surgery and the morning of Surgery.  2.  If you choose to wash your hair, wash your hair first as usual with your normal shampoo.  3.  After you shampoo, rinse your hair and body thoroughly to remove the shampoo. 4.  Use CHG as you would any other liquid soap.  You can apply chg directly to the skin and wash gently with a      scrungie or washcloth.           5.  Apply the CHG Soap to your body ONLY FROM THE NECK DOWN.   Do not use on open wounds or open sores. Avoid contact with your eyes, ears, mouth and genitals (private parts).  Wash genitals (private parts) with your normal soap.  6.  Wash thoroughly, paying special attention to the area where your surgery will be performed.  7.  Thoroughly rinse your body with warm water from the neck down.  8.  DO  NOT shower/wash with your normal soap after using and rinsing off the CHG Soap.  9.  Pat yourself dry with a clean towel.            10.  Wear clean pajamas.            11.  Place clean sheets on your bed the night of your first shower and do not sleep with pets.  Day of Surgery  Do not apply any lotions/deoderants the morning of surgery.   Please wear clean clothes to the hospital/surgery center. Remember to brush your teeth with toothpaste.    Please read over the following fact sheets that you were given. Pain Booklet and Surgical Site Infection Prevention

## 2018-05-14 NOTE — Progress Notes (Addendum)
No PCP   BP elevated on arrival to pre-admission Right arm  173/144 Left arm 162/101 Kelby FamManuel check 158/96  Result called to anesthesia PA ,who is coming to speak with patient.  Pt. Denies any problems with BP in the past.

## 2018-05-17 NOTE — Anesthesia Preprocedure Evaluation (Addendum)
Anesthesia Evaluation  Patient identified by MRN, date of birth, ID band Patient awake    Reviewed: Allergy & Precautions, NPO status , Patient's Chart, lab work & pertinent test results  Airway Mallampati: III  TM Distance: >3 FB Neck ROM: Full    Dental no notable dental hx. (+) Teeth Intact, Dental Advisory Given   Pulmonary Current Smoker,    Pulmonary exam normal breath sounds clear to auscultation       Cardiovascular Exercise Tolerance: Good negative cardio ROS Normal cardiovascular exam Rhythm:Regular Rate:Normal     Neuro/Psych negative neurological ROS  negative psych ROS   GI/Hepatic negative GI ROS, Neg liver ROS,   Endo/Other    Renal/GU      Musculoskeletal negative musculoskeletal ROS (+)   Abdominal   Peds  Hematology   Anesthesia Other Findings   Reproductive/Obstetrics                            Lab Results  Component Value Date   WBC 8.8 05/14/2018   HGB 16.9 05/14/2018   HCT 51.6 05/14/2018   MCV 90.1 05/14/2018   PLT 256 05/14/2018    Anesthesia Physical Anesthesia Plan  ASA: II  Anesthesia Plan: General   Post-op Pain Management:    Induction: Intravenous  PONV Risk Score and Plan:   Airway Management Planned: Oral ETT  Additional Equipment:   Intra-op Plan:   Post-operative Plan: Extubation in OR  Informed Consent: I have reviewed the patients History and Physical, chart, labs and discussed the procedure including the risks, benefits and alternatives for the proposed anesthesia with the patient or authorized representative who has indicated his/her understanding and acceptance.   Dental advisory given  Plan Discussed with: CRNA  Anesthesia Plan Comments:         Anesthesia Quick Evaluation

## 2018-05-18 ENCOUNTER — Encounter (HOSPITAL_COMMUNITY)
Admission: RE | Disposition: A | Discharge status: Home / Self Care | Payer: Self-pay | Source: Ambulatory Visit | Attending: Otolaryngology

## 2018-05-18 ENCOUNTER — Ambulatory Visit (HOSPITAL_COMMUNITY): Payer: Medicaid Other | Admitting: Physician Assistant

## 2018-05-18 ENCOUNTER — Ambulatory Visit (HOSPITAL_COMMUNITY): Payer: Medicaid Other | Admitting: Anesthesiology

## 2018-05-18 ENCOUNTER — Encounter (HOSPITAL_COMMUNITY): Payer: Self-pay | Admitting: Surgery

## 2018-05-18 ENCOUNTER — Other Ambulatory Visit: Payer: Self-pay

## 2018-05-18 ENCOUNTER — Observation Stay (HOSPITAL_COMMUNITY)
Admission: RE | Admit: 2018-05-18 | Discharge: 2018-05-19 | Disposition: A | Discharge status: Home / Self Care | Payer: Medicaid Other | Source: Ambulatory Visit | Attending: Otolaryngology | Admitting: Otolaryngology

## 2018-05-18 DIAGNOSIS — F17210 Nicotine dependence, cigarettes, uncomplicated: Secondary | ICD-10-CM | POA: Diagnosis not present

## 2018-05-18 DIAGNOSIS — R590 Localized enlarged lymph nodes: Secondary | ICD-10-CM | POA: Diagnosis not present

## 2018-05-18 DIAGNOSIS — Q180 Sinus, fistula and cyst of branchial cleft: Secondary | ICD-10-CM | POA: Diagnosis not present

## 2018-05-18 HISTORY — PX: EXCISION MASS NECK: SHX6703

## 2018-05-18 SURGERY — EXCISION, MASS, NECK
Anesthesia: General | Site: Neck | Laterality: Left

## 2018-05-18 MED ORDER — PHENYLEPHRINE 40 MCG/ML (10ML) SYRINGE FOR IV PUSH (FOR BLOOD PRESSURE SUPPORT)
PREFILLED_SYRINGE | INTRAVENOUS | Status: AC
  Filled 2018-05-18: qty 10

## 2018-05-18 MED ORDER — FENTANYL CITRATE (PF) 250 MCG/5ML IJ SOLN
INTRAMUSCULAR | Status: DC | PRN
  Administered 2018-05-18: 50 ug via INTRAVENOUS
  Administered 2018-05-18: 100 ug via INTRAVENOUS

## 2018-05-18 MED ORDER — ARTIFICIAL TEARS OPHTHALMIC OINT
TOPICAL_OINTMENT | OPHTHALMIC | Status: AC
  Filled 2018-05-18: qty 3.5

## 2018-05-18 MED ORDER — MIDAZOLAM HCL 2 MG/2ML IJ SOLN
INTRAMUSCULAR | Status: AC
  Filled 2018-05-18: qty 2

## 2018-05-18 MED ORDER — PHENYLEPHRINE 40 MCG/ML (10ML) SYRINGE FOR IV PUSH (FOR BLOOD PRESSURE SUPPORT)
PREFILLED_SYRINGE | INTRAVENOUS | Status: DC | PRN
  Administered 2018-05-18 (×3): 80 ug via INTRAVENOUS
  Administered 2018-05-18: 120 ug via INTRAVENOUS

## 2018-05-18 MED ORDER — DOUBLE ANTIBIOTIC 500-10000 UNIT/GM EX OINT
TOPICAL_OINTMENT | CUTANEOUS | Status: AC
  Filled 2018-05-18: qty 1

## 2018-05-18 MED ORDER — ACETAMINOPHEN 10 MG/ML IV SOLN: 1000.0000 mg | Freq: Once | INTRAVENOUS | Status: DC | PRN

## 2018-05-18 MED ORDER — MIDAZOLAM HCL 5 MG/5ML IJ SOLN
INTRAMUSCULAR | Status: DC | PRN
  Administered 2018-05-18: 2 mg via INTRAVENOUS

## 2018-05-18 MED ORDER — ONDANSETRON HCL 4 MG PO TABS: 4.0000 mg | ORAL_TABLET | ORAL | Status: DC | PRN

## 2018-05-18 MED ORDER — LIDOCAINE-EPINEPHRINE 1 %-1:100000 IJ SOLN
INTRAMUSCULAR | Status: DC | PRN
  Administered 2018-05-18: 8.5 mL

## 2018-05-18 MED ORDER — HYDROCODONE-ACETAMINOPHEN 5-325 MG PO TABS
ORAL_TABLET | ORAL | Status: AC
  Administered 2018-05-18: 1 via ORAL
  Filled 2018-05-18: qty 1

## 2018-05-18 MED ORDER — LIDOCAINE 2% (20 MG/ML) 5 ML SYRINGE
INTRAMUSCULAR | Status: DC | PRN
  Administered 2018-05-18: 100 mg via INTRAVENOUS

## 2018-05-18 MED ORDER — DEXAMETHASONE SODIUM PHOSPHATE 10 MG/ML IJ SOLN
INTRAMUSCULAR | Status: AC
  Filled 2018-05-18: qty 1

## 2018-05-18 MED ORDER — HYDROMORPHONE HCL 1 MG/ML IJ SOLN
0.2500 mg | INTRAMUSCULAR | Status: DC | PRN
  Administered 2018-05-18: 0.25 mg via INTRAVENOUS

## 2018-05-18 MED ORDER — SUCCINYLCHOLINE CHLORIDE 200 MG/10ML IV SOSY
PREFILLED_SYRINGE | INTRAVENOUS | Status: DC | PRN
  Administered 2018-05-18: 80 mg via INTRAVENOUS

## 2018-05-18 MED ORDER — ONDANSETRON HCL 4 MG/2ML IJ SOLN: 4.0000 mg | INTRAMUSCULAR | Status: DC | PRN

## 2018-05-18 MED ORDER — HYDROCODONE-ACETAMINOPHEN 7.5-325 MG PO TABS: 1.0000 | ORAL_TABLET | Freq: Once | ORAL | Status: DC | PRN

## 2018-05-18 MED ORDER — DEXAMETHASONE SODIUM PHOSPHATE 10 MG/ML IJ SOLN
INTRAMUSCULAR | Status: DC | PRN
  Administered 2018-05-18: 5 mg via INTRAVENOUS

## 2018-05-18 MED ORDER — HYDROCODONE-ACETAMINOPHEN 5-325 MG PO TABS
1.0000 | ORAL_TABLET | ORAL | Status: DC | PRN
  Administered 2018-05-18: 2 via ORAL
  Administered 2018-05-18: 1 via ORAL
  Administered 2018-05-18 – 2018-05-19 (×2): 2 via ORAL
  Filled 2018-05-18 (×3): qty 2
  Filled 2018-05-18: qty 1

## 2018-05-18 MED ORDER — EPHEDRINE SULFATE-NACL 50-0.9 MG/10ML-% IV SOSY
PREFILLED_SYRINGE | INTRAVENOUS | Status: DC | PRN
  Administered 2018-05-18 (×4): 10 mg via INTRAVENOUS

## 2018-05-18 MED ORDER — SODIUM CHLORIDE 0.9 % IV SOLN
INTRAVENOUS | Status: DC | PRN
  Administered 2018-05-18: 25 ug/min via INTRAVENOUS

## 2018-05-18 MED ORDER — FENTANYL CITRATE (PF) 250 MCG/5ML IJ SOLN
INTRAMUSCULAR | Status: AC
  Filled 2018-05-18: qty 5

## 2018-05-18 MED ORDER — PROPOFOL 10 MG/ML IV BOLUS
INTRAVENOUS | Status: AC
  Filled 2018-05-18: qty 40

## 2018-05-18 MED ORDER — BACITRACIN ZINC 500 UNIT/GM EX OINT: TOPICAL_OINTMENT | CUTANEOUS | Status: DC | PRN

## 2018-05-18 MED ORDER — ONDANSETRON HCL 4 MG/2ML IJ SOLN
INTRAMUSCULAR | Status: DC | PRN
  Administered 2018-05-18: 4 mg via INTRAVENOUS

## 2018-05-18 MED ORDER — EPHEDRINE 5 MG/ML INJ
INTRAVENOUS | Status: AC
  Filled 2018-05-18: qty 10

## 2018-05-18 MED ORDER — PROMETHAZINE HCL 25 MG/ML IJ SOLN: 6.2500 mg | INTRAMUSCULAR | Status: DC | PRN

## 2018-05-18 MED ORDER — CHLORHEXIDINE GLUCONATE CLOTH 2 % EX PADS: 6.0000 | MEDICATED_PAD | Freq: Once | CUTANEOUS | Status: DC

## 2018-05-18 MED ORDER — PROPOFOL 10 MG/ML IV BOLUS
INTRAVENOUS | Status: DC | PRN
  Administered 2018-05-18: 30 mg via INTRAVENOUS
  Administered 2018-05-18: 50 mg via INTRAVENOUS
  Administered 2018-05-18: 170 mg via INTRAVENOUS

## 2018-05-18 MED ORDER — ONDANSETRON HCL 4 MG/2ML IJ SOLN
INTRAMUSCULAR | Status: AC
  Filled 2018-05-18: qty 2

## 2018-05-18 MED ORDER — DEXTROSE-NACL 5-0.45 % IV SOLN
INTRAVENOUS | Status: DC
  Administered 2018-05-18 (×2): via INTRAVENOUS

## 2018-05-18 MED ORDER — HYDROMORPHONE HCL 1 MG/ML IJ SOLN
INTRAMUSCULAR | Status: AC
  Filled 2018-05-18: qty 1

## 2018-05-18 MED ORDER — IBUPROFEN 100 MG/5ML PO SUSP
400.0000 mg | Freq: Four times a day (QID) | ORAL | Status: DC | PRN
  Administered 2018-05-18 – 2018-05-19 (×3): 400 mg via ORAL
  Filled 2018-05-18 (×5): qty 20

## 2018-05-18 MED ORDER — LIDOCAINE-EPINEPHRINE 1 %-1:100000 IJ SOLN
INTRAMUSCULAR | Status: AC
  Filled 2018-05-18: qty 1

## 2018-05-18 MED ORDER — MEPERIDINE HCL 50 MG/ML IJ SOLN: 6.2500 mg | INTRAMUSCULAR | Status: DC | PRN

## 2018-05-18 MED ORDER — 0.9 % SODIUM CHLORIDE (POUR BTL) OPTIME
TOPICAL | Status: DC | PRN
  Administered 2018-05-18: 1000 mL

## 2018-05-18 MED ORDER — LACTATED RINGERS IV SOLN
INTRAVENOUS | Status: DC | PRN
  Administered 2018-05-18 (×2): via INTRAVENOUS

## 2018-05-18 SURGICAL SUPPLY — 56 items
ADH SKN CLS APL DERMABOND .7 (GAUZE/BANDAGES/DRESSINGS) ×2
AIRSTRIP 4 3/4X3 1/4 7185 (GAUZE/BANDAGES/DRESSINGS) IMPLANT
BLADE SURG 15 STRL LF DISP TIS (BLADE) IMPLANT
BLADE SURG 15 STRL SS (BLADE)
BNDG GAUZE ELAST 4 BULKY (GAUZE/BANDAGES/DRESSINGS) IMPLANT
CANISTER SUCT 3000ML PPV (MISCELLANEOUS) IMPLANT
CLEANER TIP ELECTROSURG 2X2 (MISCELLANEOUS) ×4 IMPLANT
CONT SPEC 4OZ CLIKSEAL STRL BL (MISCELLANEOUS) ×4 IMPLANT
CORD BIPOLAR FORCEPS 12FT (ELECTRODE) ×3 IMPLANT
COVER SURGICAL LIGHT HANDLE (MISCELLANEOUS) ×4 IMPLANT
CRADLE DONUT ADULT HEAD (MISCELLANEOUS) IMPLANT
DECANTER SPIKE VIAL GLASS SM (MISCELLANEOUS) ×3 IMPLANT
DERMABOND ADVANCED (GAUZE/BANDAGES/DRESSINGS) ×2
DERMABOND ADVANCED .7 DNX12 (GAUZE/BANDAGES/DRESSINGS) ×1 IMPLANT
DRAIN PENROSE 1/4X12 LTX STRL (WOUND CARE) IMPLANT
DRAPE HALF SHEET 40X57 (DRAPES) IMPLANT
DRSG EMULSION OIL 3X3 NADH (GAUZE/BANDAGES/DRESSINGS) IMPLANT
ELECT COATED BLADE 2.86 ST (ELECTRODE) ×4 IMPLANT
ELECT REM PT RETURN 9FT ADLT (ELECTROSURGICAL) ×4
ELECTRODE REM PT RTRN 9FT ADLT (ELECTROSURGICAL) ×2 IMPLANT
FORCEPS BIPOLAR SPETZLER 8 1.0 (NEUROSURGERY SUPPLIES) ×3 IMPLANT
GAUZE 4X4 16PLY RFD (DISPOSABLE) ×4 IMPLANT
GAUZE SPONGE 4X4 12PLY STRL (GAUZE/BANDAGES/DRESSINGS) IMPLANT
GLOVE BIO SURGEON STRL SZ 6.5 (GLOVE) ×2 IMPLANT
GLOVE BIO SURGEONS STRL SZ 6.5 (GLOVE) ×1
GLOVE ECLIPSE 8.0 STRL XLNG CF (GLOVE) ×7 IMPLANT
GOWN STRL REUS W/ TWL LRG LVL3 (GOWN DISPOSABLE) ×2 IMPLANT
GOWN STRL REUS W/ TWL XL LVL3 (GOWN DISPOSABLE) ×2 IMPLANT
GOWN STRL REUS W/TWL LRG LVL3 (GOWN DISPOSABLE) ×4
GOWN STRL REUS W/TWL XL LVL3 (GOWN DISPOSABLE) ×4
KIT BASIN OR (CUSTOM PROCEDURE TRAY) ×4 IMPLANT
KIT TURNOVER KIT B (KITS) ×4 IMPLANT
LOCATOR NERVE 3 VOLT (DISPOSABLE) IMPLANT
NDL HYPO 25GX1X1/2 BEV (NEEDLE) IMPLANT
NEEDLE HYPO 25GX1X1/2 BEV (NEEDLE) IMPLANT
NS IRRIG 1000ML POUR BTL (IV SOLUTION) ×4 IMPLANT
PAD ARMBOARD 7.5X6 YLW CONV (MISCELLANEOUS) ×8 IMPLANT
PENCIL BUTTON HOLSTER BLD 10FT (ELECTRODE) ×4 IMPLANT
SHEARS HARMONIC 9CM CVD (BLADE) ×3 IMPLANT
STAPLER VISISTAT 35W (STAPLE) ×4 IMPLANT
SUT CHROMIC 3 0 PS 2 (SUTURE) ×4 IMPLANT
SUT CHROMIC 4 0 P 3 18 (SUTURE) ×4 IMPLANT
SUT CHROMIC 4 0 PS 2 18 (SUTURE) ×3 IMPLANT
SUT ETHILON 4 0 PS 2 18 (SUTURE) ×3 IMPLANT
SUT ETHILON 6 0 P 1 (SUTURE) ×4 IMPLANT
SUT SILK 3 0 (SUTURE) ×4
SUT SILK 3 0 REEL (SUTURE) ×3 IMPLANT
SUT SILK 3-0 18XBRD TIE 12 (SUTURE) ×2 IMPLANT
SUT VIC AB 4-0 SH 27 (SUTURE) ×8
SUT VIC AB 4-0 SH 27XANBCTRL (SUTURE) ×2 IMPLANT
SWAB COLLECTION DEVICE MRSA (MISCELLANEOUS) IMPLANT
SWAB CULTURE ESWAB REG 1ML (MISCELLANEOUS) IMPLANT
SYR TB 1ML LUER SLIP (SYRINGE) IMPLANT
TOWEL OR 17X24 6PK STRL BLUE (TOWEL DISPOSABLE) ×4 IMPLANT
TRAY ENT MC OR (CUSTOM PROCEDURE TRAY) ×4 IMPLANT
WATER STERILE IRR 1000ML POUR (IV SOLUTION) ×4 IMPLANT

## 2018-05-18 NOTE — Interval H&P Note (Signed)
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 After consideration of risks, benefits and other options for treatment, the patient has consented to  Procedure(s): EXCISION LEFT NECK BRANCHIAL CLEFT CYST FROZEN SECTION POSSIBLE NECK DISSECTION (Left) as a surgical intervention .  The patient's history has been re-reviewed, patient re-examined, no change in status, stable for surgery.  I have re-reviewed the patient's chart and labs.  Questions were answered to the patient's satisfaction.     Flo Shanks

## 2018-05-18 NOTE — Op Note (Signed)
05/18/2018  10:54 AM    Levi Johnston  [id number]   Pre-Op Dx: Level 2 cystic neck mass, possible metastasis  Post-op Dx: Level 2 cystic neck mass  Proc: Excision level 2 mass  Surg:  Flo Shanks T MD  Ass't:  Doran Heater MD  Anes:  GOT  EBL:  min  Comp:  none  Findings: Loculated seeming 3 x 4 x 4 cm cystic neck mass.  Multiple adjacent lymph nodes. all benign by frozen section.  Procedure: With the patient in a comfortable supine position, general orotracheal anesthesia was achieved without difficulty.  At an appropriate level, a shoulder roll was placed.  The patient was placed in reverse Trendelenburg.  The head was rotated to the right for access to the left neck.  The routine surgical timeout was obtained in the standard fashion.  CT scan was reviewed.  Patient's neck was examined with the findings as described above.  The proposed incision line was infiltrated with 1% Xylocaine with 1-100,000 epinephrine, 8 mL total.  Several minutes were allowed for the septic effect  A Hibiclens sterile preparation and draping of the entire left neck was performed in the standard fashion.  The previously marked wound line was incised 6-8 cm and carried down through skin, subcutaneous fat, and platysma muscle.  Superior subplatysmal planes were elevated.  The fascia was taken from the lateral surface of the sternocleidomastoid muscle and rolled anteriorly around the anterior edge.  Working along the sternal mastoid muscle medial surface, the mass was encountered.  It was gradually dissected bluntly, sharply, and using the harmonic scalpel.  Spinal accessory nerve was identified and preserved.  The jugular vein was small but also preserved.  There was a possible tract heading superiorly which was amputated as it approached the pharynx.   The lesion was sent to pathology for frozen section interpretation return as benign.   The cyst content was violated and a small amount of  leakage occurred in the case.  This was thoroughly irrigated.  A 10 French round drain was placed in the lower skin and laid into the wound bed.  The platysmal layer was closed in the standard fashion.  The skin was closed in the subcuticular layer.  Finally cosmetic approximation of the skin surface was performed with Dermabond.  At this point the procedure was completed by the patient anesthesia, awakened, extubated, and transferred to recovery in stable condition.  Dispo:   PACU to 23-hour observation.  Plan: ice, elevation, analgesia.  We will remove the drain at 24 hours and was discharged to home in care of family.  Cephus Richer MD

## 2018-05-18 NOTE — Transfer of Care (Signed)
Immediate Anesthesia Transfer of Care Note  Patient: [name]  Procedure(s) Performed: EXCISION LEFT LEVEL TWO NECK MASS (Left Neck)  Patient Location: PACU  Anesthesia Type:General  Level of Consciousness: drowsy  Airway & Oxygen Therapy: Patient Spontanous Breathing and Patient connected to face mask oxygen  Post-op Assessment: Report given to RN and Post -op Vital signs reviewed and stable  Post vital signs: Reviewed and stable  Last Vitals:  Vitals Value Taken Time  BP 128/75 05/18/2018 10:57 AM  Temp 36.6 C 05/18/2018 10:57 AM  Pulse 98 05/18/2018 10:59 AM  Resp 14 05/18/2018 10:59 AM  SpO2 95 % 05/18/2018 10:59 AM  Vitals shown include unvalidated device data.  Last Pain:  Vitals:   05/18/18 0706  TempSrc:   PainSc: 0-No pain      Patients Stated Pain Goal: 2 (05/18/18 0706)  Complications: No apparent anesthesia complications

## 2018-05-18 NOTE — Discharge Instructions (Signed)
Keep head elevated 3-4 nights OK to shower beginning Wednesday No strenuous activity x 2 weeks Advance diet as comfortabe Hydrocodone alternate with Ibuprofen for pain relief You may trim off the loose edges of the wound glue as they loosen. Call for signs of infection, bleeding, excessive pain,[phone] Recheck my office 2 weeks.  Same #.

## 2018-05-18 NOTE — Progress Notes (Signed)
ENT Post Operative Note  Subjective: No nausea, vomiting No difficulty voiding Pain well controlled  Vitals:   05/18/18 1235 05/18/18 1300  BP: 115/76 126/84  Pulse: 98 (!) 101  Resp: 12 15  Temp:  97.7 F (36.5 C)  SpO2: 96% 95%     OBJECTIVE  Gen: alert, cooperative, appropriate Head/ENT: EOMI, neck supple, mucus membranes moist and pink, conjunctiva clear Incisions c/d/i, drain with mild serosanguinous drainage Face moves symmetrically Respiratory: Voice without dysphonia. non-labored breathing, no accessory muscle use, normal HR, good O2 saturations Intact shoulder movement  ASSESS/ PLAN  Levi Johnston is a 52 y.o. male who is POD 0 from left branchial cleft cyst excision.  -pain control -MIVF- will saline lock when tolerating PO intake -wound care, routine  Graylin Shiver, MD

## 2018-05-18 NOTE — Anesthesia Postprocedure Evaluation (Signed)
Anesthesia Post Note  Patient: Levi Johnston  Procedure(s) Performed: EXCISION LEFT LEVEL TWO NECK MASS (Left Neck)     Patient location during evaluation: PACU Anesthesia Type: General Level of consciousness: awake and alert Pain management: pain level controlled Vital Signs Assessment: post-procedure vital signs reviewed and stable Respiratory status: spontaneous breathing, nonlabored ventilation, respiratory function stable and patient connected to nasal cannula oxygen Cardiovascular status: blood pressure returned to baseline and stable Postop Assessment: no apparent nausea or vomiting Anesthetic complications: no    Last Vitals:  Vitals:   05/18/18 1300 05/18/18 1809  BP: 126/84 115/73  Pulse: (!) 101 98  Resp: 15   Temp: 36.5 C 36.7 C  SpO2: 95% 97%    Last Pain:  Vitals:   05/18/18 1809  TempSrc: Oral  PainSc:                  Trevor Iha

## 2018-05-18 NOTE — Anesthesia Procedure Notes (Signed)
Procedure Name: Intubation Date/Time: 05/18/2018 8:45 AM Performed by: Colin Benton, CRNA Pre-anesthesia Checklist: Emergency Drugs available, Patient identified, Suction available and Patient being monitored Patient Re-evaluated:Patient Re-evaluated prior to induction Oxygen Delivery Method: Circle system utilized Preoxygenation: Pre-oxygenation with 100% oxygen Induction Type: IV induction Ventilation: Mask ventilation without difficulty and Oral airway inserted - appropriate to patient size Laryngoscope Size: Mac and 3 Grade View: Grade I Tube type: Oral Tube size: 8.0 mm Number of attempts: 1 Airway Equipment and Method: Stylet Placement Confirmation: ETT inserted through vocal cords under direct vision,  positive ETCO2 and breath sounds checked- equal and bilateral Secured at: 23 cm Tube secured with: Tape Dental Injury: Teeth and Oropharynx as per pre-operative assessment

## 2018-05-19 ENCOUNTER — Encounter (HOSPITAL_COMMUNITY): Payer: Self-pay | Admitting: Otolaryngology

## 2018-05-19 DIAGNOSIS — Q180 Sinus, fistula and cyst of branchial cleft: Secondary | ICD-10-CM | POA: Diagnosis not present

## 2018-05-19 MED ORDER — HYDROCODONE-ACETAMINOPHEN 5-325 MG PO TABS: 1.0000 | ORAL_TABLET | ORAL | 0 refills | Status: DC | PRN

## 2018-05-19 NOTE — Discharge Summary (Signed)
05/19/2018 10:10 AM  Levi Johnston [id number]  Post-Op Day 1    Temp:  [97.7 F (36.5 C)-98.5 F (36.9 C)] 97.9 F (36.6 C) (09/04 0503) Pulse Rate:  [77-104] 77 (09/04 0503) Resp:  [11-20] 16 (09/04 0503) BP: (113-131)/(67-97) 113/74 (09/04 0503) SpO2:  [93 %-97 %] 94 % (09/04 0503) Weight:  [90.7 kg] 90.7 kg (09/03 1338),     Intake/Output Summary (Last 24 hours) at 05/19/2018 1010 Last data filed at 05/19/2018 0641 Gross per 24 hour  Intake 3423.26 ml  Output 53 ml  Net 3370.26 ml    No results found for this or any previous visit (from the past 24 hour(s)).  SUBJECTIVE:  Some muscular pain.  Void+  Breathing well.  Eating and drinking.   OBJECTIVE:  Neck flat.  Drain removed without diff.  CN XI intact LEFT.  IMPRESSION:  Satisfactory check  PLAN:  Discharge to home in care of family  Admit:  3 SEP Discharge:  4 SEP Final diagnosis:  LEFT second branchial cleft cyst Proc:  Excision LEFT neck lesion, 3 SEP Comp: none Cond:  Ambulatory.  Pain controlled.  Eating and drinking OK R/v: 2 weeks Instructions written and given Rx:  Hydrocodone  Hosp Course:  Underwent surgery without complications.  Observed 23 hr overnight .  Drain removed and discharged home on AM   POD 1.    Flo Shanks

## 2018-05-19 NOTE — Progress Notes (Signed)
Patient given discharge instructions and verbalized understanding.Patient left unit in stable condition. 

## 2018-09-22 DIAGNOSIS — R222 Localized swelling, mass and lump, trunk: Secondary | ICD-10-CM | POA: Diagnosis not present

## 2018-09-22 DIAGNOSIS — R590 Localized enlarged lymph nodes: Secondary | ICD-10-CM | POA: Diagnosis not present

## 2018-09-30 ENCOUNTER — Other Ambulatory Visit: Payer: Self-pay | Admitting: Otolaryngology

## 2018-09-30 DIAGNOSIS — R222 Localized swelling, mass and lump, trunk: Secondary | ICD-10-CM

## 2018-10-05 ENCOUNTER — Ambulatory Visit
Admission: RE | Admit: 2018-10-05 | Discharge: 2018-10-05 | Disposition: A | Discharge status: Home / Self Care | Payer: Medicaid Other | Source: Ambulatory Visit | Attending: Otolaryngology | Admitting: Otolaryngology

## 2018-10-05 DIAGNOSIS — R222 Localized swelling, mass and lump, trunk: Secondary | ICD-10-CM

## 2018-10-05 DIAGNOSIS — R221 Localized swelling, mass and lump, neck: Secondary | ICD-10-CM | POA: Diagnosis not present

## 2018-10-05 DIAGNOSIS — J351 Hypertrophy of tonsils: Secondary | ICD-10-CM | POA: Diagnosis not present

## 2018-10-05 MED ORDER — IOPAMIDOL (ISOVUE-300) INJECTION 61%
75.0000 mL | Freq: Once | INTRAVENOUS | Status: AC | PRN
  Administered 2018-10-05: 75 mL via INTRAVENOUS

## 2019-12-05 ENCOUNTER — Encounter (HOSPITAL_COMMUNITY): Payer: Self-pay | Admitting: Emergency Medicine

## 2019-12-05 ENCOUNTER — Emergency Department (HOSPITAL_COMMUNITY): Payer: Medicaid Other

## 2019-12-05 ENCOUNTER — Other Ambulatory Visit: Payer: Self-pay

## 2019-12-05 ENCOUNTER — Emergency Department (HOSPITAL_COMMUNITY)
Admission: EM | Admit: 2019-12-05 | Discharge: 2019-12-05 | Disposition: A | Discharge status: Home / Self Care | Payer: Medicaid Other | Attending: Emergency Medicine | Admitting: Emergency Medicine

## 2019-12-05 DIAGNOSIS — G8929 Other chronic pain: Secondary | ICD-10-CM

## 2019-12-05 DIAGNOSIS — K625 Hemorrhage of anus and rectum: Secondary | ICD-10-CM | POA: Diagnosis not present

## 2019-12-05 DIAGNOSIS — M25551 Pain in right hip: Secondary | ICD-10-CM | POA: Insufficient documentation

## 2019-12-05 DIAGNOSIS — M1611 Unilateral primary osteoarthritis, right hip: Secondary | ICD-10-CM | POA: Diagnosis not present

## 2019-12-05 LAB — CBC
HCT: 51.9 % (ref 39.0–52.0)
Hemoglobin: 17.1 g/dL — ABNORMAL HIGH (ref 13.0–17.0)
MCH: 29.4 pg (ref 26.0–34.0)
MCHC: 32.9 g/dL (ref 30.0–36.0)
MCV: 89.2 fL (ref 80.0–100.0)
Platelets: 310 10*3/uL (ref 150–400)
RBC: 5.82 MIL/uL — ABNORMAL HIGH (ref 4.22–5.81)
RDW: 14.3 % (ref 11.5–15.5)
WBC: 8.6 10*3/uL (ref 4.0–10.5)
nRBC: 0 % (ref 0.0–0.2)

## 2019-12-05 LAB — COMPREHENSIVE METABOLIC PANEL
ALT: 64 U/L — ABNORMAL HIGH (ref 0–44)
AST: 33 U/L (ref 15–41)
Albumin: 3.9 g/dL (ref 3.5–5.0)
Alkaline Phosphatase: 58 U/L (ref 38–126)
Anion gap: 9 (ref 5–15)
BUN: 15 mg/dL (ref 6–20)
CO2: 23 mmol/L (ref 22–32)
Calcium: 9.2 mg/dL (ref 8.9–10.3)
Chloride: 106 mmol/L (ref 98–111)
Creatinine, Ser: 1.2 mg/dL (ref 0.61–1.24)
GFR calc Af Amer: >60 mL/min (ref >60)
GFR calc non Af Amer: >60 mL/min (ref >60)
Glucose, Bld: 114 mg/dL — ABNORMAL HIGH (ref 70–99)
Potassium: 4.2 mmol/L (ref 3.5–5.1)
Sodium: 138 mmol/L (ref 135–145)
Total Bilirubin: 0.8 mg/dL (ref 0.3–1.2)
Total Protein: 7.5 g/dL (ref 6.5–8.1)

## 2019-12-05 LAB — ABO/RH: ABO/RH(D): O POS

## 2019-12-05 LAB — TYPE AND SCREEN
ABO/RH(D): O POS
Antibody Screen: NEGATIVE

## 2019-12-05 LAB — POC OCCULT BLOOD, ED: Fecal Occult Bld: NEGATIVE

## 2019-12-05 MED ORDER — DICLOFENAC SODIUM 1 % EX GEL: 2.0000 g | Freq: Four times a day (QID) | CUTANEOUS | 0 refills | Status: DC

## 2019-12-05 NOTE — ED Provider Notes (Signed)
[facility] EMERGENCY DEPARTMENT Provider Note   CSN: [id number] Arrival date & time: 12/05/19  1000     History Chief Complaint  Patient presents with  . Rectal Bleeding  . Hip Pain    Levi Johnston is a 54 y.o. male with no significant PMH presents to the ED with complaints of chronic, worsening right-sided discomfort in addition to BRBPR x2 with defecation.  He states that he is a very active individual with significant history running, boxing, hiking, and other physically demanding activities.  He suspects that his right-sided hip discomfort could be sciatica versus osteoarthritis, but wanted to obtain imaging.  He has always been afraid of going to the doctor and has not seen a primary care provider in decades.  He recently lost his mother and brother to cancer and given his BRBPR, came to the ER for evaluation.  He states that after the past, he began to eat very poorly and has endorsed intermittent constipation over the past couple of years, but this does not feel like one of his regular hemorrhoids.  He denies any recent illness, fevers or chills, chest pain or dizziness, abdominal pain, nausea vomiting, urinary symptoms, pain with defecation, weakness or numbness, or other neurologic symptoms.  He has not taken any medications for his hip discomfort.  He has never had a colonoscopy.  HPI     Past Medical History:  Diagnosis Date  . Anxiety     Patient Active Problem List   Diagnosis Date Noted  . 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 EXCISION MASS NECK Left 05/18/2018   Procedure: EXCISION LEFT LEVEL TWO NECK MASS;  Surgeon: Jodi Marble, MD;  Location: [place];  Service: ENT;  Laterality: Left;  . TONSILLECTOMY  05/03/2012   Procedure: TONSILLECTOMY;  Surgeon: Izora Gala, MD;  Location: [place];  Service: ENT;  Laterality: N/A;       No family history on file.  Social History    Tobacco Use  . Smoking status: Current Every Day Smoker    Packs/day: 1.00    Years: 30.00    Pack years: 30.00    Types: Cigarettes  . Smokeless tobacco: Never Used  Substance Use Topics  . Alcohol use: Not Currently    Comment: drinks very rare  . Drug use: Not Currently    Types: Marijuana, Cocaine    Comment: nothing recently    Home Medications Prior to Admission medications   Medication Sig Start Date End Date Taking? Authorizing Provider  diclofenac Sodium (VOLTAREN) 1 % GEL Apply 2 g topically 4 (four) times daily. 12/05/19   Corena Herter, PA-C  HYDROcodone-acetaminophen (NORCO/VICODIN) 5-325 MG tablet Take 1-2 tablets by mouth every 4 (four) hours as needed for moderate pain. 2/0/25   Jodi Marble, MD    Allergies    Penicillins  Review of Systems   Review of Systems  Gastrointestinal: Positive for blood in stool. Negative for rectal pain.  Musculoskeletal: Positive for arthralgias and gait problem.  Skin: Negative for color change.  Neurological: Negative for weakness and numbness.    Physical Exam Updated Vital Signs BP (!) 160/114   Pulse 83   Temp 98.5 F (36.9 C) (Oral)   Resp 17   Ht 6' (1.829 m)   Wt 96.6 kg   SpO2 95%   BMI 28.89 kg/m   Physical Exam Vitals and nursing note reviewed. Exam conducted with a chaperone  present.  Constitutional:      Appearance: Normal appearance.  HENT:     Head: Normocephalic and atraumatic.  Eyes:     General: No scleral icterus.    Conjunctiva/sclera: Conjunctivae normal.  Cardiovascular:     Rate and Rhythm: Normal rate and regular rhythm.     Pulses: Normal pulses.     Heart sounds: Normal heart sounds.  Pulmonary:     Effort: Pulmonary effort is normal. No respiratory distress.     Breath sounds: Normal breath sounds.  Abdominal:     Comments: Soft, nontender.  No TTP.  No guarding.  No overlying skin changes.  NABS.  Genitourinary:    Comments: Rectal exam: No gross BRBPR or melena on exam.   Small skin tag noted anteriorly, but no obvious hemorrhoids or fissures appreciated.  DRE demonstrated no hard stool in rectal vault or obvious masses.   Musculoskeletal:     Cervical back: Normal range of motion. No rigidity.     Comments: Negative SLR bilaterally.  No midline lumbar TTP.  No overlying skin changes. Right hip: ROM and strength intact.  Sensation intact throughout.  Distal pulses and cap refill intact.  Skin:    General: Skin is dry.     Capillary Refill: Capillary refill takes less than 2 seconds.  Neurological:     Mental Status: He is alert and oriented to person, place, and time.     GCS: GCS eye subscore is 4. GCS verbal subscore is 5. GCS motor subscore is 6.  Psychiatric:        Mood and Affect: Mood normal.        Behavior: Behavior normal.        Thought Content: Thought content normal.     ED Results / Procedures / Treatments   Labs (all labs ordered are listed, but only abnormal results are displayed) Labs Reviewed  COMPREHENSIVE METABOLIC PANEL - Abnormal; Notable for the following components:      Result Value   Glucose, Bld 114 (*)    ALT 64 (*)    All other components within normal limits  CBC - Abnormal; Notable for the following components:   RBC 5.82 (*)    Hemoglobin 17.1 (*)    All other components within normal limits  POC OCCULT BLOOD, ED  TYPE AND SCREEN  ABO/RH    EKG None  Radiology DG Hip Unilat  With Pelvis 2-3 Views Right  Result Date: 12/05/2019 CLINICAL DATA:  Pain, right hip. Additional history provided: Patient reports chronic pain for 4-6 months, no trauma, pain in right buttock EXAM: DG HIP (WITH OR WITHOUT PELVIS) 2-3V RIGHT COMPARISON:  CT abdomen/pelvis 09/13/2017 FINDINGS: There is normal bony alignment. No evidence of acute osseous or articular abnormality. Mild bilateral superolateral femoroacetabular joint space narrowing. IMPRESSION: No evidence of acute osseous or articular abnormality. Mild bilateral superolateral  femoroacetabular joint space narrowing. Electronically Signed   By: Jackey Loge DO   On: 12/05/2019 11:17    Procedures Procedures (including critical care time)  Medications Ordered in ED Medications - No data to display  ED Course  I have reviewed the triage vital signs and the nursing notes.  Pertinent labs & imaging results that were available during my care of the patient were reviewed by me and considered in my medical decision making (see chart for details).    MDM Rules/Calculators/A&P  Patient presents to ED with complaints regarding his BRBPR.  He states that he only had 1-2 episodes of rectal bleeding and denied any pain with defecation.  He has since had a bowel movement that was "like a symphony" with no bleeding or discomfort.  He agrees that he needs to follow-up with primary care provider for ongoing evaluation and management of his health and wellbeing.  His brother died from prostate cancer, but he denies any significant urinary symptoms.  Discussed PSA screenings.    While his rectal exam was entirely reassuring, given his BRBPR and his age, he needs to have a colonoscopy performed.  No hemorrhoids or fissure appreciated on exam.  No abdominal pain or tenderness on exam that would otherwise warrant imaging.  Will refer to gastroenterology for ongoing evaluation and colonoscopy.  His hemoglobin is mildly elevated at 17.1 and he is hemodynamically stable.  Low suspicion for an acute bleed.  He is in no acute distress.  Recommended high-fiber diet to avoid his complaints of worsening constipation.  The remainder of his lab work was entirely reassuring.  As for his chronic right-sided hip discomfort, will prescribe Voltaren.  NSAIDs are probably most appropriate, but given his recent bleeding do not want to exacerbate a potential issue.  He may also take Tylenol as needed.  Discussed strict ED return precautions.  All of the evaluation and work-up results  were discussed with the patient and any family at bedside. They were provided opportunity to ask any additional questions and have none at this time. They have expressed understanding of verbal discharge instructions as well as return precautions and are agreeable to the plan.    Final Clinical Impression(s) / ED Diagnoses Final diagnoses:  Rectal bleeding  Chronic right hip pain    Rx / DC Orders ED Discharge Orders         Ordered    diclofenac Sodium (VOLTAREN) 1 % GEL  4 times daily     12/05/19 1246           Elvera Maria 12/05/19 1247    Tilden Fossa, MD 12/07/19 1044

## 2019-12-05 NOTE — Discharge Instructions (Addendum)
Please call [facility] to schedule appointment for colonoscopy and for ongoing evaluation of your rectal bleeding.  It is also vitally important that you get established with a primary care provider for ongoing evaluation and management of your health and wellbeing.  Do not forget to bring up your concerns with them regarding PSA screening.  You also may benefit from orthopedic referral for further evaluation of your chronic right hip discomfort.  In the interim, I recommend that you take Voltaren and Tylenol as needed for symptomatic relief.  Return to the ED or seek immediate medical attention for any new or worsening symptoms.

## 2019-12-05 NOTE — ED Triage Notes (Signed)
Onset 2 days ago has bowel movement bright red x 2 denies abdominal pain. States last bowel movement normal yesterday. States also having right hip pain worsening overtime.

## 2019-12-21 ENCOUNTER — Encounter: Payer: Self-pay | Admitting: Orthopedic Surgery

## 2019-12-21 ENCOUNTER — Ambulatory Visit (INDEPENDENT_AMBULATORY_CARE_PROVIDER_SITE_OTHER): Payer: Medicaid Other

## 2019-12-21 ENCOUNTER — Other Ambulatory Visit: Payer: Self-pay

## 2019-12-21 ENCOUNTER — Ambulatory Visit (INDEPENDENT_AMBULATORY_CARE_PROVIDER_SITE_OTHER): Payer: Medicaid Other | Admitting: Orthopedic Surgery

## 2019-12-21 DIAGNOSIS — M79604 Pain in right leg: Secondary | ICD-10-CM | POA: Diagnosis not present

## 2019-12-21 NOTE — Progress Notes (Signed)
Office Visit Note   Patient: Levi Johnston           Date of Birth: [birth year]           MRN: [id number] Visit Date: 12/21/2019 Requested by: No referring provider defined for this encounter. PCP: Patient, No Pcp Per  Subjective: Chief Complaint  Patient presents with   Right Leg - Pain    HPI: Levi Johnston is a patient with right hip and buttock and trochanteric pain which has been going on now for about 6 months.  At times he is unable to walk.  When he walks and stands for more than 15 minutes he describes intense pain in that posterior buttock and right trochanteric region which occasionally radiates down the back of his right leg.  Has had symptoms for 6 months.  He reports decreased function due to pain.  The pain does not wake him from sleep at night.  He is tried Motrin without relief.  He does get relief when he lays down.  He works in [employer].              ROS: All systems reviewed are negative as they relate to the chief complaint within the history of present illness.  Patient denies  fevers or chills.   Assessment & Plan: Visit Diagnoses:  1. Pain in right leg     Plan: Impression is right hip and but buttock pain with normal hip radiographs and mild spinal listhesis on plain radiographs of the lumbar spine.  I think it is possible he could have pretty severe right-sided trochanteric bursitis although that typically does not limit peoples walking endurance in a severe way like it is for [employer].  I think most likely this is radiculopathy on the right-hand side.  Been going on for 6 months.  He has failed conservative management including activity modification in terms of work modifications as well as stretching and anti-inflammatories.  I would like to get MRI of the lumbar spine to evaluate right-sided radiculopathy with ESI to follow.  Also would like to rule out trochanteric bursitis or other abductor mechanism problems as a source of his pain.  I will see him back after the  studies  Follow-Up Instructions: Return for after MRI.   Orders:  Orders Placed This Encounter  Procedures   XR Lumbar Spine 2-3 Views   MR Lumbar Spine w/o contrast   MR Hip Right w/o contrast   No orders of the defined types were placed in this encounter.     Procedures: No procedures performed   Clinical Data: No additional findings.  Objective: Vital Signs: There were no vitals taken for this visit.  Physical Exam:   Constitutional: Patient appears well-developed HEENT:  Head: Normocephalic Eyes:EOM are normal Neck: Normal range of motion Cardiovascular: Normal rate Pulmonary/chest: Effort normal Neurologic: Patient is alert Skin: Skin is warm Psychiatric: Patient has normal mood and affect    Ortho Exam: Ortho exam demonstrates normal gait alignment.  No nerve root tension signs on the left is mildly positive on the right at 90 degrees.  Patient has 5 out of 5 ankle dorsiflexion plantarflexion quad hamstring strength with no paresthesias L1 S1 bilaterally.  No masses lymphadenopathy or skin changes noted in that right hip region.  Does have trochanteric tenderness to direct palpation on the right versus the left.  In general he is a very compact muscularly tight person.  Specialty Comments:  No specialty comments available.  Imaging: XR Lumbar Spine 2-3  Views  Result Date: 12/21/2019 AP lateral lumbar spine reviewed.  Grade 1 spondylolisthesis present at L4-5.  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  Visualized hips appear intact.  No acute fracture.  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 No pertinent family history.  Past Surgical History:  Procedure Laterality Date    EXCISION MASS NECK Left 05/18/2018   Procedure: EXCISION LEFT LEVEL TWO NECK MASS;  Surgeon: Jodi Marble, MD;  Location: [place];  Service: ENT;  Laterality: Left;   TONSILLECTOMY  05/03/2012   Procedure: TONSILLECTOMY;  Surgeon: Izora Gala, MD;  Location: [place];  Service: ENT;  Laterality: N/A;   Social History   Occupational History   Not on file  Tobacco Use   Smoking status: Current Every Day Smoker    Packs/day: 1.00    Years: 30.00    Pack years: 30.00    Types: Cigarettes   Smokeless tobacco: Never Used  Substance and Sexual Activity   Alcohol use: Not Currently    Comment: drinks very rare   Drug use: Not Currently    Types: Marijuana, Cocaine    Comment: nothing recently   Sexual activity: Not on file    Comment: smokes  1 pack a day

## 2020-01-11 ENCOUNTER — Ambulatory Visit (INDEPENDENT_AMBULATORY_CARE_PROVIDER_SITE_OTHER): Payer: Medicaid Other | Admitting: Orthopedic Surgery

## 2020-01-11 ENCOUNTER — Other Ambulatory Visit: Payer: Self-pay

## 2020-01-11 ENCOUNTER — Telehealth: Payer: Self-pay

## 2020-01-11 ENCOUNTER — Encounter: Payer: Self-pay | Admitting: Orthopedic Surgery

## 2020-01-11 DIAGNOSIS — M79604 Pain in right leg: Secondary | ICD-10-CM | POA: Diagnosis not present

## 2020-01-11 NOTE — Progress Notes (Signed)
Office Visit Note   Patient: Levi Johnston           Date of Birth: Aug 07, 1966           MRN: [id number] Visit Date: 01/11/2020 Requested by: No referring provider defined for this encounter. PCP: Patient, No Pcp Per  Subjective: Chief Complaint  Patient presents with  . Follow-up    HPI: Ripley is a patient with radicular pain going down the right leg.  Is worse than it was before.  Has been going on for longer than 6 months.  He has tried anti-inflammatories activity modification home exercise program and his symptoms are worsening.  States the pain is now severe.  Unable to do anything including standing and walking for longer than 10 minutes.  He is having some pain along the lumbar spine.  Also pain in the superior trochanteric region.  Sitting on the edge of the seat helps.  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              ROS: All systems reviewed are negative as they relate to the chief complaint within the history of present illness.  Patient denies  fevers or chills.   Assessment & Plan: Visit Diagnoses:  1. Pain in right leg     Plan: Impression is right hip pain and radicular leg pain.  Start physical therapy to add to the home exercise program he has been doing for the last 8 weeks.  Reorder MRI scan of the lumbar spine.  Most likely this is the culprit with symptoms going down below the knee into the calf.  Hip MRI scan will be put on hold.  He has failed conservative measures including anti-inflammatories home exercise program activity modification as well as therapy which is pending.  Once he undergoes physical therapy which is unlikely to be helpful we will set him up for MRI scan which needs to be done because he does have a slight amount of weakness today on ankle dorsiflexion on the right compared to the left.  This is a new finding.  Follow-Up Instructions: Return for after MRI.   Orders:  Orders Placed This Encounter  Procedures  .  Ambulatory referral to Physical Therapy   No orders of the defined types were placed in this encounter.     Procedures: No procedures performed   Clinical Data: No additional findings.  Objective: Vital Signs: There were no vitals taken for this visit.  Physical Exam:   Constitutional: Patient appears well-developed HEENT:  Head: Normocephalic Eyes:EOM are normal Neck: Normal range of motion Cardiovascular: Normal rate Pulmonary/chest: Effort normal Neurologic: Patient is alert Skin: Skin is warm Psychiatric: Patient has normal mood and affect    Ortho Exam: Ortho exam demonstrates normal gait alignment.  5- out of 5 ankle dorsiflexion on the right compared to 5+ out of 5 on the left.  Positive nerve root tension signs on the right negative on the left.  No real groin pain with internal X rotation of the leg.  No muscle atrophy in the leg.  Reflexes symmetric.  Negative clonus.  Does have mild pain with forward lateral bending.  He is able to stand on the right and left leg without difficulty.  Hip flexion strength 5+ out of 5 bilaterally.  No definite paresthesias L1 S1 bilaterally.  Specialty Comments:  No specialty comments available.  Imaging: No results found.   PMFS History: Patient Active Problem List   Diagnosis Date  Noted  . 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 Anxiety     History reviewed. No pertinent family history.  Past Surgical History:  Procedure Laterality Date  . EXCISION MASS NECK Left 05/18/2018   Procedure: EXCISION LEFT LEVEL TWO NECK MASS;  Surgeon: Jodi Marble, MD;  Location: [facility];  Service: ENT;  Laterality: Left;  . TONSILLECTOMY  05/03/2012   Procedure: TONSILLECTOMY;  Surgeon: Izora Gala, MD;  Location: [place];  Service: ENT;  Laterality: N/A;   Social History   Occupational History  . Not on file  Tobacco Use  . Smoking status: Current Every Day Smoker    Packs/day:  1.00    Years: 30.00    Pack years: 30.00    Types: Cigarettes  . Smokeless tobacco: Never Used  Substance and Sexual Activity  . Alcohol use: Not Currently    Comment: drinks very rare  . Drug use: Not Currently    Types: Marijuana, Cocaine    Comment: nothing recently  . Sexual activity: Not on file    Comment: smokes  1 pack a day

## 2020-01-11 NOTE — Telephone Encounter (Signed)
Dr August Saucer wants [place] to try to get MRI approved now. Do I need to put in a new order?

## 2020-01-12 ENCOUNTER — Ambulatory Visit: Payer: Medicaid Other | Admitting: Orthopedic Surgery

## 2020-01-12 DIAGNOSIS — K625 Hemorrhage of anus and rectum: Secondary | ICD-10-CM | POA: Diagnosis not present

## 2020-01-13 NOTE — Telephone Encounter (Signed)
Pt has to have 6 weeks of conservative treatment first in order for insurance to pay.

## 2020-01-16 ENCOUNTER — Other Ambulatory Visit: Payer: Self-pay

## 2020-01-16 ENCOUNTER — Encounter: Payer: Self-pay | Admitting: Physical Therapy

## 2020-01-16 ENCOUNTER — Ambulatory Visit: Payer: Medicaid Other | Attending: Orthopedic Surgery | Admitting: Physical Therapy

## 2020-01-16 DIAGNOSIS — G8929 Other chronic pain: Secondary | ICD-10-CM | POA: Insufficient documentation

## 2020-01-16 DIAGNOSIS — M545 Low back pain, unspecified: Secondary | ICD-10-CM

## 2020-01-16 DIAGNOSIS — M25551 Pain in right hip: Secondary | ICD-10-CM | POA: Diagnosis not present

## 2020-01-16 DIAGNOSIS — G5701 Lesion of sciatic nerve, right lower limb: Secondary | ICD-10-CM | POA: Insufficient documentation

## 2020-01-16 NOTE — Therapy (Signed)
[facility] [address] [place], [place], [address] Phone: [phone]   Fax:  [phone]  Physical Therapy Evaluation  Patient Details  Name: Levi Johnston MRN: [id number] Date of Birth: 06/18/1966 Referring Provider (PT): August Saucer Corrie Mckusick, MD    Encounter Date: 01/16/2020  PT End of Session - 01/16/20 1147    Visit Number  1    Authorization Type  MCD    Authorization Time Period  --    PT Start Time  1147    PT Stop Time  1230    PT Time Calculation (min)  43 min    Activity Tolerance  Patient tolerated treatment well    Behavior During Therapy  [facility] for tasks assessed/performed       Past Medical History:  Diagnosis Date  . Anxiety     Past Surgical History:  Procedure Laterality Date  . EXCISION MASS NECK Left 05/18/2018   Procedure: EXCISION LEFT LEVEL TWO NECK MASS;  Surgeon: Flo Shanks, MD;  Location: [facility] OR;  Service: ENT;  Laterality: Left;  . TONSILLECTOMY  05/03/2012   Procedure: TONSILLECTOMY;  Surgeon: Serena Colonel, MD;  Location: [facility];  Service: ENT;  Laterality: N/A;    There were no vitals filed for this visit.   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 I've been depressed over the last year and I think my body got weak. I was working at my house and I got weak. It started in my lower back and it moved to my hip, right under my butt. I can't even walk. The pain moves from the side of my hip to my butt. When I blow my nose, my back hurts. I get a bolt of pain down my leg. I was digging a ditch when I hurt my back. I thought I pulled my back. It feels like I got shot in my hip. They said I have some degeneration. I have to sit on hard surfaces to ease the pain"    Limitations  Standing;Sitting;Walking;House hold activities    How long can you stand comfortably?  10 mins    How long can you walk  comfortably?  10 mins    Patient Stated Goals  "I need to have less pain"    Currently in Pain?  Yes    Pain Score  0-No pain    Pain Location  Hip    Pain Orientation  Right    Pain Descriptors / Indicators  Sharp;Aching    Pain Type  Chronic pain    Pain Radiating Towards  Leg    Pain Onset  More than a month ago    Pain Frequency  Intermittent    Aggravating Factors   Standing, Walking    Pain Relieving Factors  Sitting on hard surface, laying    Effect of Pain on Daily Activities  working, exercise         [facility] PT Assessment - 01/16/20 0001      Assessment   Medical Diagnosis  Pain in right leg     Referring Provider (PT)  Cammy Copa, MD     Onset Date/Surgical Date  08/16/19    Prior Therapy  No      Precautions   Precautions  None      Restrictions   Weight Bearing Restrictions  No  Balance Screen   Has the patient fallen in the past 6 months  No    Has the patient had a decrease in activity level because of a fear of falling?   No    Is the patient reluctant to leave their home because of a fear of falling?   No      Prior Function   Level of Independence  Independent    Leisure  Exercising, Boxing       Cognition   Overall Cognitive Status  Within Functional Limits for tasks assessed    Attention  Focused    Focused Attention  Appears intact    Memory  Appears intact    Awareness  Appears intact    Problem Solving  Appears intact      Posture/Postural Control   Posture/Postural Control  No significant limitations      ROM / Strength   AROM / PROM / Strength  Strength      Strength   Overall Strength  Within functional limits for tasks performed                Objective measurements completed on examination: See above findings.      [facility] Adult PT Treatment/Exercise - 01/16/20 0001      Exercises   Exercises  Lumbar;Knee/Hip      Lumbar Exercises: Stretches   Active Hamstring Stretch  Right;Left;2 reps;20 seconds     Piriformis Stretch  Left;Right;2 reps;30 seconds    Other Lumbar Stretch Exercise  Figure 4 Piriformis Stretch in supine and sitting, 30 sec hold      Lumbar Exercises: Supine   Other Supine Lumbar Exercises  Supine marches w/ core engaged       Lumbar Exercises: Sidelying   Clam  20 reps;Right      Lumbar Exercises: Prone   Other Prone Lumbar Exercises  Planks, 30 sec hold              PT Education - 01/16/20 1233    Education Details  Patient educated on new HEP, piriformis syndrome, pertinent anatomy, and importance of regressing exercises    Person(s) Educated  Patient    Methods  Explanation;Demonstration;Handout    Comprehension  Verbalized understanding       PT Short Term Goals - 01/16/20 1246      PT SHORT TERM GOAL #1   Title  Patient will be independent w/ initial HEP    Baseline  HEP provided 01/16/2020    Time  4    Period  Weeks    Status  Achieved    Target Date  02/13/20      PT SHORT TERM GOAL #2   Title  Patient will report that he is able to walk and stand for >/= 15 mins in order to shop in store with less frequent stops    Baseline  Patient reports that he can stand for 10 mins    Time  4    Period  Weeks    Status  Achieved    Target Date  02/13/20      PT SHORT TERM GOAL #3   Title  Patient will report a max 5/10 pain so that he is able to return to exercising per patient's stated goals    Baseline  Patient reports that pain can be unbearable, and he is unable to walk at times    Time  4    Period  Weeks    Status  Achieved    Target Date  02/13/20                Plan - 01/16/20 1225    Clinical Impression Statement  Patient presents to the clinic reporting right-sided lower back, hip, and buttock pain.  He reports that the pain started 6 months ago after digging a ditch. His pain is consistent with piriformis syndrome. Patient reported that he got a great amount of relief from the various piriformis stretches provided. He was  also given core strengthening exercises and educated on the importance of core strength and its relation to back pain. Patient was provided multiple home exercises per the physicians request. He was educated on the piriformis and sciatic nerve. Patient agreed that PT diagnoses was congruent to his symptoms. He indicated that he would discuss further PT visits with his referring physician.    Examination-Activity Limitations  Lift;Sit;Stand    Examination-Participation Restrictions  Other;Shop;Cleaning;Community Activity    Stability/Clinical Decision Making  Stable/Uncomplicated    Clinical Decision Making  Low    Rehab Potential  Good    PT Frequency  One time visit    PT Treatment/Interventions  ADLs/Self Care Home Management;Electrical Stimulation;Cryotherapy;Iontophoresis 4mg /ml Dexamethasone;Moist Heat;Traction;Ultrasound;Gait training;Stair training;Functional mobility training;Therapeutic activities;Therapeutic exercise;Balance training;Neuromuscular re-education;Cognitive remediation;Patient/family education;Manual techniques;Passive range of motion;Dry needling;Taping;Joint Manipulations    PT Home Exercise Plan  [id number]    Consulted and Agree with Plan of Care  Patient       Patient will benefit from skilled therapeutic intervention in order to improve the following deficits and impairments:  Decreased activity tolerance, Decreased endurance, Decreased strength, Difficulty walking, Increased muscle spasms, Postural dysfunction, Improper body mechanics, Pain  Visit Diagnosis: Piriformis syndrome of right side  Chronic right-sided low back pain, unspecified whether sciatica present  Pain in right hip     Problem List Patient Active Problem List   Diagnosis Date Noted  . 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 [place], [place], [address] Phone: [phone]   Fax:   [phone]  Name: Levi Johnston MRN: [id number] Date of Birth: [birth year]

## 2020-01-17 ENCOUNTER — Other Ambulatory Visit: Payer: Medicaid Other

## 2020-01-17 DIAGNOSIS — Z1159 Encounter for screening for other viral diseases: Secondary | ICD-10-CM | POA: Diagnosis not present

## 2020-01-20 DIAGNOSIS — K625 Hemorrhage of anus and rectum: Secondary | ICD-10-CM | POA: Diagnosis not present

## 2020-01-20 DIAGNOSIS — D124 Benign neoplasm of descending colon: Secondary | ICD-10-CM | POA: Diagnosis not present

## 2020-01-20 DIAGNOSIS — K6389 Other specified diseases of intestine: Secondary | ICD-10-CM | POA: Diagnosis not present

## 2020-01-20 DIAGNOSIS — K5730 Diverticulosis of large intestine without perforation or abscess without bleeding: Secondary | ICD-10-CM | POA: Diagnosis not present

## 2020-01-20 DIAGNOSIS — K640 First degree hemorrhoids: Secondary | ICD-10-CM | POA: Diagnosis not present

## 2020-02-01 NOTE — Telephone Encounter (Signed)
Looks like patient was schedule for PT on 01/16/20 at [facility].

## 2020-02-08 ENCOUNTER — Telehealth: Payer: Self-pay | Admitting: Orthopedic Surgery

## 2020-02-08 NOTE — Telephone Encounter (Signed)
Patient called. Says he has not had a MRI done as of yet. Would like to know the status of the approval for the MRI.

## 2020-02-08 NOTE — Telephone Encounter (Signed)
Called patient advised. Insurance denied MRI, requires 6 weeks of conservative treatment before we can submit again or it will be denied again. Patient has only gone to one physical therapy appt. Advise to call office to schedule more.  Patient is requesting medication to help manage pain.

## 2020-02-10 ENCOUNTER — Other Ambulatory Visit: Payer: Self-pay | Admitting: Surgical

## 2020-02-10 MED ORDER — MELOXICAM 15 MG PO TABS: 15.0000 mg | ORAL_TABLET | Freq: Every day | ORAL | 0 refills | Status: AC

## 2020-02-10 NOTE — Telephone Encounter (Signed)
Submitted RX for mobic to help with pain. Dont take with other NSAIDs

## 2020-02-10 NOTE — Telephone Encounter (Signed)
Notified patient.

## 2020-02-14 ENCOUNTER — Ambulatory Visit: Payer: Medicaid Other | Admitting: Physical Therapy

## 2020-02-17 ENCOUNTER — Telehealth: Payer: Self-pay | Admitting: Orthopedic Surgery

## 2020-02-17 DIAGNOSIS — M79604 Pain in right leg: Secondary | ICD-10-CM

## 2020-02-17 NOTE — Telephone Encounter (Signed)
Pt called returning [place] call, states he is waiting to be referred for PT.  [phone]

## 2020-02-17 NOTE — Telephone Encounter (Signed)
Tried calling, no answer. LMVM to call back to further discuss.

## 2020-02-17 NOTE — Telephone Encounter (Signed)
Patient called and requesting a call back about physical therapy. Patient states no one has contacted him after first PT evaluation. Patient has medicaid and [facility] rep told him that a message would be sent to Dr. August Saucer to see if new referral is need and or new evaluation is needed. Patient last referral date was 01/13/20. Please give patient call about this matter. Patient phone number is [phone].

## 2020-02-20 NOTE — Telephone Encounter (Signed)
I spoke with patient, referral has been made. He will reach out to them about getting PT scheduled.

## 2020-02-23 ENCOUNTER — Encounter: Payer: Self-pay | Admitting: Physical Therapy

## 2020-02-23 ENCOUNTER — Ambulatory Visit: Payer: Medicaid Other | Attending: Orthopedic Surgery | Admitting: Physical Therapy

## 2020-02-23 ENCOUNTER — Other Ambulatory Visit: Payer: Self-pay

## 2020-02-23 DIAGNOSIS — G8929 Other chronic pain: Secondary | ICD-10-CM | POA: Diagnosis not present

## 2020-02-23 DIAGNOSIS — M545 Low back pain, unspecified: Secondary | ICD-10-CM

## 2020-02-23 DIAGNOSIS — M25551 Pain in right hip: Secondary | ICD-10-CM | POA: Diagnosis not present

## 2020-02-23 DIAGNOSIS — G5701 Lesion of sciatic nerve, right lower limb: Secondary | ICD-10-CM

## 2020-02-23 NOTE — Therapy (Signed)
[place], [address] Phone: [phone]   Fax:  [phone]  Physical Therapy Treatment  Patient Details  Name: Levi Johnston MRN: [id number] Date of Birth: January 13, 1966 Referring Provider (PT): Marlou Sa Tonna Corner, MD    Encounter Date: 02/23/2020   PT End of Session - 02/23/20 0856    Visit Number 1    Number of Visits 4    Date for PT Re-Evaluation 04/05/20    Authorization Type MCD auth submitted    PT Start Time 0808   0848   PT Stop Time 0848    PT Time Calculation (min) 40 min    Activity Tolerance Patient tolerated treatment well    Behavior During Therapy [facility] for tasks assessed/performed           Past Medical History:  Diagnosis Date  . Anxiety     Past Surgical History:  Procedure Laterality Date  . EXCISION MASS NECK Left 05/18/2018   Procedure: EXCISION LEFT LEVEL TWO NECK MASS;  Surgeon: Jodi Marble, MD;  Location: [place];  Service: ENT;  Laterality: Left;  . TONSILLECTOMY  05/03/2012   Procedure: TONSILLECTOMY;  Surgeon: Izora Gala, MD;  Location: [place];  Service: ENT;  Laterality: N/A;    There were no vitals filed for this visit.   Subjective Assessment - 02/23/20 0854    Subjective Patient reports he may be a little better. He has lost some weight which he feels has helped. He is still getting pain down his leg but it is not as intense.    Limitations Standing;Sitting;Walking;House hold activities    How long can you stand comfortably? 10 mins    How long can you walk comfortably? 10 mins    Patient Stated Goals "I need to have less pain"    Currently in Pain? No/denies   pain comes at times             [facility] PT Assessment - 02/23/20 0001      Assessment   Medical Diagnosis Pain in right leg     Referring Provider (PT) Marlou Sa Tonna Corner, MD       Prior Function   Level of Independence Independent    Leisure Exercising, Boxing        Sensation   Additional Comments pain radiating into the right buttocl       Coordination   Gross Motor Movements are Fluid and Coordinated Yes    Fine Motor Movements are Fluid and Coordinated Yes      ROM / Strength   AROM / PROM / Strength AROM      AROM   Overall AROM Comments full active ROM of the right hip with minor pain at end range hip flexion       Strength   Overall Strength Comments hip flexion 4+/5 right/ Hip abduction 4+/5 right all other strength 5/5       Palpation   Palpation comment tenderness to palpation in the right piriformis and right lumbar paraspinals       Ambulation/Gait   Gait Comments decreased single leg stance tinme on the right.                          [facility] Adult PT Treatment/Exercise - 02/23/20 0001      Lumbar Exercises: Stretches   Piriformis Stretch Left;Right;2 reps;30 seconds    Other Lumbar Stretch Exercise Figure 4 Piriformis  Stretch in supine and sitting, 30 sec hold    Other Lumbar Stretch Exercise reviewed each for HEP       Lumbar Exercises: Supine   Clam Limitations 2x10 green     Bridge Limitations 2x10    Other Supine Lumbar Exercises Supine marches w/ core engaged: reviewed progression       Manual Therapy   Manual Therapy Soft tissue mobilization;Joint mobilization;Manual Traction    Manual therapy comments Patient given information abvout trigger point dry needling     Soft tissue mobilization to lumbar spine and deep pressure to piriformis.     Manual Traction grade III and Iv occialtions                   PT Education - 02/23/20 0855    Education Details improtance of starting with basic exercises. The patient wants to jog.    Person(s) Educated Patient    Methods Explanation;Demonstration;Tactile cues;Verbal cues    Comprehension Verbalized understanding;Returned demonstration;Verbal cues required;Tactile cues required            PT Short Term Goals - 02/23/20 1159      PT SHORT TERM  GOAL #1   Title Patient will be independent w/ initial HEP    Baseline updated hter-ex    Time 3    Period Weeks    Status On-going    Target Date 03/15/20      PT SHORT TERM GOAL #2   Title Patient will report that he is able to walk and stand for >/= 15 mins in order to shop in store with less frequent stops    Baseline still continues to be able to walk only limied distances    Time 3    Period Weeks    Status New    Target Date 03/15/20      PT SHORT TERM GOAL #3   Title Patient will report a max 5/10 pain so that he is able to return to exercising per patient's stated goals    Baseline continues to reach high levels    Time 3    Period Weeks    Status New    Target Date 03/15/20             PT Long Term Goals - 02/23/20 1201      PT LONG TERM GOAL #1   Title Patient will return to light joggin without pain    Baseline can not jog    Time 6    Period Weeks    Status New    Target Date 04/05/20      PT LONG TERM GOAL #2   Title Patient will increase rightsingle leg stance time to 30 dseconds in order to improve ability to walk and run    Baseline 10 seconds with instability compared to the left    Time 6    Period Weeks    Status New    Target Date 04/05/20                 Plan - 02/23/20 0856    Clinical Impression Statement All objective measures ar ehte same as intial eval. The patient coninues to have weakness in his hip fleors and abductors. He has decreased single leg stance time compared to the left. He hastenderness to palaption in the right piriformis and right lumbar spine. He has had some relief from the stretches and exercises that were given to him last visit. He would like  to jobg again but was advised to hold until we can start working on single leg strength and stability. He is very motivated but was adivsed to just work on the base plan and we will advance him as he improves. The patient would benefit from skilled therapy to reduce muscle  spasming, improve right LE strength and improve ability to return to prior level of function.    Examination-Activity Limitations Lift;Sit;Stand    Examination-Participation Restrictions Other;Shop;Cleaning;Community Activity    Stability/Clinical Decision Making Stable/Uncomplicated    Clinical Decision Making Low    Rehab Potential Excellent    PT Frequency 1x / week    PT Duration 3 weeks   per medicaid guidlines   PT Treatment/Interventions ADLs/Self Care Home Management;Electrical Stimulation;Cryotherapy;Iontophoresis 4mg /ml Dexamethasone;Moist Heat;Traction;Ultrasound;Gait training;Stair training;Functional mobility training;Therapeutic activities;Therapeutic exercise;Balance training;Neuromuscular re-education;Cognitive remediation;Patient/family education;Manual techniques;Passive range of motion;Dry needling;Taping;Joint Manipulations    PT Home Exercise Plan [id number]+ piriformis stretch           Patient will benefit from skilled therapeutic intervention in order to improve the following deficits and impairments:  Decreased activity tolerance, Decreased endurance, Decreased strength, Difficulty walking, Increased muscle spasms, Postural dysfunction, Improper body mechanics, Pain  Visit Diagnosis: Chronic right-sided low back pain, unspecified whether sciatica present - Plan: PT plan of care cert/re-cert  Pain in right hip - Plan: PT plan of care cert/re-cert  Piriformis syndrome of right side - Plan: PT plan of care cert/re-cert     Problem List Patient Active Problem List   Diagnosis Date Noted  . 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 [place], [place], [place] Phone: [phone]   Fax:  [phone]  Name: Levi Johnston MRN: Fredirick Lathe Date of Birth: [birth year]

## 2020-03-09 ENCOUNTER — Encounter: Payer: Self-pay | Admitting: Physical Therapy

## 2020-03-09 ENCOUNTER — Other Ambulatory Visit: Payer: Self-pay

## 2020-03-09 ENCOUNTER — Ambulatory Visit: Payer: Medicaid Other | Admitting: Physical Therapy

## 2020-03-09 DIAGNOSIS — G8929 Other chronic pain: Secondary | ICD-10-CM | POA: Diagnosis not present

## 2020-03-09 DIAGNOSIS — M545 Low back pain: Secondary | ICD-10-CM | POA: Diagnosis not present

## 2020-03-09 DIAGNOSIS — G5701 Lesion of sciatic nerve, right lower limb: Secondary | ICD-10-CM

## 2020-03-09 DIAGNOSIS — M25551 Pain in right hip: Secondary | ICD-10-CM | POA: Diagnosis not present

## 2020-03-09 NOTE — Therapy (Signed)
[facility]patient Rehabilitation [facility] [address] [place], [place], [address] Phone: [phone]   Fax:  [phone]  Physical Therapy Treatment  Patient Details  Name: Emiliano Welshans MRN: [id number] Date of Birth: 1965/10/11 Referring Provider (PT): August Saucer Corrie Mckusick, MD    Encounter Date: 03/09/2020   PT End of Session - 03/09/20 1039    Visit Number 2    Number of Visits 4    Date for PT Re-Evaluation 04/05/20    Authorization Type MCD    Authorization Time Period 03/05/20-03/25/20    Authorization - Visit Number 1    Authorization - Number of Visits 3    PT Start Time 0930    PT Stop Time 1014    PT Time Calculation (min) 44 min           Past Medical History:  Diagnosis Date  . Anxiety     Past Surgical History:  Procedure Laterality Date  . EXCISION MASS NECK Left 05/18/2018   Procedure: EXCISION LEFT LEVEL TWO NECK MASS;  Surgeon: Flo Shanks, MD;  Location: [facility] OR;  Service: ENT;  Laterality: Left;  . TONSILLECTOMY  05/03/2012   Procedure: TONSILLECTOMY;  Surgeon: Serena Colonel, MD;  Location: [facility];  Service: ENT;  Laterality: N/A;    There were no vitals filed for this visit.   Subjective Assessment - 03/09/20 0932    Subjective Had some pretty good days and some horrible days. I cannot figure out what triggers the pain. I do know what helps.    Currently in Pain? No/denies                             [facility] Adult PT Treatment/Exercise - 03/09/20 0001      Lumbar Exercises: Stretches   Piriformis Stretch Left;Right;2 reps;30 seconds    Other Lumbar Stretch Exercise Figure 4 Piriformis Stretch in supine and sitting, 30 sec hold      Lumbar Exercises: Standing   Other Standing Lumbar Exercises 10/20# dead lifts    Other Standing Lumbar Exercises Goblet Squats no Wt , single leg cone reach x 10 right       Lumbar Exercises: Supine   Clam Limitations 2x10 green     Bridge with clamshell 20  reps    Bridge with [employer] Limitations green band       Lumbar Exercises: Sidelying   Clam 20 reps;Right    Clam Limitations green band       Knee/Hip Exercises: Standing   SLS 30 sec right                     PT Short Term Goals - 02/23/20 1159      PT SHORT TERM GOAL #1   Title Patient will be independent w/ initial HEP    Baseline updated hter-ex    Time 3    Period Weeks    Status On-going    Target Date 03/15/20      PT SHORT TERM GOAL #2   Title Patient will report that he is able to walk and stand for >/= 15 mins in order to shop in store with less frequent stops    Baseline still continues to be able to walk only limied distances    Time 3    Period Weeks    Status New    Target Date 03/15/20      PT SHORT  TERM GOAL #3   Title Patient will report a max 5/10 pain so that he is able to return to exercising per patient's stated goals    Baseline continues to reach high levels    Time 3    Period Weeks    Status New    Target Date 03/15/20             PT Long Term Goals - 02/23/20 1201      PT LONG TERM GOAL #1   Title Patient will return to light joggin without pain    Baseline can not jog    Time 6    Period Weeks    Status New    Target Date 04/05/20      PT LONG TERM GOAL #2   Title Patient will increase rightsingle leg stance time to 30 dseconds in order to improve ability to walk and run    Baseline 10 seconds with instability compared to the left    Time 6    Period Weeks    Status New    Target Date 04/05/20                 Plan - 03/09/20 0935    Clinical Impression Statement Still getting severe pains that can come on without aggravation. He wants to return to running and was advised to start walking short distances first. He requests higher level exercises.  He was instructed in mechanics for dead lifts and goblet squats as well as single leg squats. He was also advised not to overdue these new exercises and to  start without weight and focus mechanics.    PT Home Exercise Plan [id number]+ piriformis stretch           Patient will benefit from skilled therapeutic intervention in order to improve the following deficits and impairments:  Decreased activity tolerance, Decreased endurance, Decreased strength, Difficulty walking, Increased muscle spasms, Postural dysfunction, Improper body mechanics, Pain  Visit Diagnosis: Chronic right-sided low back pain, unspecified whether sciatica present  Pain in right hip  Piriformis syndrome of right side     Problem List Patient Active Problem List   Diagnosis Date Noted  . 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

## 2020-03-15 ENCOUNTER — Other Ambulatory Visit: Payer: Self-pay

## 2020-03-15 ENCOUNTER — Encounter: Payer: Self-pay | Admitting: Physical Therapy

## 2020-03-15 ENCOUNTER — Ambulatory Visit: Payer: Medicaid Other | Attending: Orthopedic Surgery | Admitting: Physical Therapy

## 2020-03-15 DIAGNOSIS — M25551 Pain in right hip: Secondary | ICD-10-CM

## 2020-03-15 DIAGNOSIS — G8929 Other chronic pain: Secondary | ICD-10-CM | POA: Diagnosis not present

## 2020-03-15 DIAGNOSIS — M545 Low back pain, unspecified: Secondary | ICD-10-CM

## 2020-03-15 DIAGNOSIS — G5701 Lesion of sciatic nerve, right lower limb: Secondary | ICD-10-CM

## 2020-03-15 NOTE — Therapy (Signed)
[facility] [address] [place], [place], [address] Phone: [phone]   Fax:  [phone]  Physical Therapy Treatment  Patient Details  Name: Levi Johnston MRN: [id number] Date of Birth: 1965/10/13 Referring Provider (PT): August Saucer Corrie Mckusick, MD    Encounter Date: 03/15/2020   PT End of Session - 03/15/20 0809    Visit Number 3    Number of Visits 4    Date for PT Re-Evaluation 04/05/20    Authorization Type MCD    PT Start Time 0803    PT Stop Time 0842    PT Time Calculation (min) 39 min    Activity Tolerance Patient tolerated treatment well    Behavior During Therapy [facility] for tasks assessed/performed           Past Medical History:  Diagnosis Date  . Anxiety     Past Surgical History:  Procedure Laterality Date  . EXCISION MASS NECK Left 05/18/2018   Procedure: EXCISION LEFT LEVEL TWO NECK MASS;  Surgeon: Flo Shanks, MD;  Location: [facility] OR;  Service: ENT;  Laterality: Left;  . TONSILLECTOMY  05/03/2012   Procedure: TONSILLECTOMY;  Surgeon: Serena Colonel, MD;  Location: [facility];  Service: ENT;  Laterality: N/A;    There were no vitals filed for this visit.   Subjective Assessment - 03/15/20 0810    Subjective Patient reports he was in a lot of pain this morning, but is feeling a little better by the time he got to therapy. He sleeps in the fetal position and does not have pain when sleeping, it starts when he stands up. He was able to get out and mow the grass yesterday. Pt states he is still a little anxious about dry needling. He states pain radiates from the lower back to under the glute.    Currently in Pain? Yes    Pain Score 7    this morning before arriving to therapy                            [facility] Adult PT Treatment/Exercise - 03/15/20 0001      Lumbar Exercises: Stretches   Piriformis Stretch Left;Right;2 reps;30 seconds    Other Lumbar Stretch Exercise Figure 4  Piriformis Stretch in supine and sitting, 30 sec hold      Lumbar Exercises: Supine   Bridge Limitations Single Leg Bridges x15 right only    Bridge with clamshell 20 reps    Bridge with [employer] Limitations blue band      Knee/Hip Exercises: Aerobic   Nustep L6 x 5 min      Knee/Hip Exercises: Standing   Other Standing Knee Exercises lateral walks x20 each way   blue     Knee/Hip Exercises: Supine   Bridges Limitations --    Bridges with Clamshell --      Manual Therapy   Manual Therapy Soft tissue mobilization;Joint mobilization;Manual Traction    Manual therapy comments Patient given information abvout trigger point dry needling     Soft tissue mobilization to lumbar spineand deep pressure to QL and piriformis.     Manual Traction LAD grade III and IV occialtions                   PT Education - 03/15/20 1132    Education Details HEP and piriformis syndrome; educated on holding off from wanting for a few weeks allowing time to get  stronger    Person(s) Educated Patient    Methods Explanation;Demonstration;Tactile cues;Verbal cues    Comprehension Verbal cues required;Tactile cues required;Returned demonstration;Verbalized understanding            PT Short Term Goals - 02/23/20 1159      PT SHORT TERM GOAL #1   Title Patient will be independent w/ initial HEP    Baseline updated hter-ex    Time 3    Period Weeks    Status On-going    Target Date 03/15/20      PT SHORT TERM GOAL #2   Title Patient will report that he is able to walk and stand for >/= 15 mins in order to shop in store with less frequent stops    Baseline still continues to be able to walk only limied distances    Time 3    Period Weeks    Status New    Target Date 03/15/20      PT SHORT TERM GOAL #3   Title Patient will report a max 5/10 pain so that he is able to return to exercising per patient's stated goals    Baseline continues to reach high levels    Time 3    Period Weeks      Status New    Target Date 03/15/20             PT Long Term Goals - 02/23/20 1201      PT LONG TERM GOAL #1   Title Patient will return to light joggin without pain    Baseline can not jog    Time 6    Period Weeks    Status New    Target Date 04/05/20      PT LONG TERM GOAL #2   Title Patient will increase rightsingle leg stance time to 30 dseconds in order to improve ability to walk and run    Baseline 10 seconds with instability compared to the left    Time 6    Period Weeks    Status New    Target Date 04/05/20                 Plan - 03/15/20 0940    Clinical Impression Statement Patient still has pain in his lower QL muscle and piriformis. Therapy did some STW to release trigger points in those areas. Patient was educated on how trigger points in those areas can radiate to his areas of pain on the side of the hip and the bottom of the buttocks. He requested harder exercises, and therapy progressed his bridge exercise to a single leg bridge. Therapy advised to hold off on running and focus on the strenthening exercises provided.    PT Treatment/Interventions ADLs/Self Care Home Management;Electrical Stimulation;Cryotherapy;Iontophoresis 4mg /ml Dexamethasone;Moist Heat;Traction;Ultrasound;Gait training;Stair training;Functional mobility training;Therapeutic activities;Therapeutic exercise;Balance training;Neuromuscular re-education;Cognitive remediation;Patient/family education;Manual techniques;Passive range of motion;Dry needling;Taping;Joint Manipulations    PT Next Visit Plan assess tolerance to new exercises    PT Home Exercise Plan [id number]+ piriformis stretch    Consulted and Agree with Plan of Care Patient           Patient will benefit from skilled therapeutic intervention in order to improve the following deficits and impairments:  Decreased activity tolerance, Decreased endurance, Decreased strength, Difficulty walking, Increased muscle spasms, Postural  dysfunction, Improper body mechanics, Pain  Visit Diagnosis: Chronic right-sided low back pain, unspecified whether sciatica present  Pain in right hip  Piriformis syndrome of right side  Problem List Patient Active Problem List   Diagnosis Date Noted  . 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 [place], [place], [address] Phone: [phone]   Fax:  [phone]  Name: Teyton Pattillo MRN: [id number] Date of Birth: Feb 17, 1966

## 2020-03-22 ENCOUNTER — Ambulatory Visit: Payer: Medicaid Other | Admitting: Physical Therapy

## 2020-03-22 ENCOUNTER — Encounter: Payer: Self-pay | Admitting: Physical Therapy

## 2020-03-22 ENCOUNTER — Other Ambulatory Visit: Payer: Self-pay

## 2020-03-22 DIAGNOSIS — M25551 Pain in right hip: Secondary | ICD-10-CM

## 2020-03-22 DIAGNOSIS — G5701 Lesion of sciatic nerve, right lower limb: Secondary | ICD-10-CM

## 2020-03-22 DIAGNOSIS — G8929 Other chronic pain: Secondary | ICD-10-CM | POA: Diagnosis not present

## 2020-03-22 DIAGNOSIS — M545 Low back pain, unspecified: Secondary | ICD-10-CM

## 2020-03-22 NOTE — Therapy (Signed)
[facility] [address] [place], [place], [address] Phone: [phone]   Fax:  [phone]  Physical Therapy Treatment/Re-eval   Patient Details  Name: Levi Johnston MRN: [id number] Date of Birth: 1965-12-13 Referring Provider (PT): August Saucer Corrie Mckusick, MD    Encounter Date: 03/22/2020   PT End of Session - 03/22/20 0806    Visit Number 4    Number of Visits 10    Date for PT Re-Evaluation 05/03/20    PT Start Time 0803    PT Stop Time 0845    PT Time Calculation (min) 42 min    Activity Tolerance Patient tolerated treatment well    Behavior During Therapy [facility] for tasks assessed/performed           Past Medical History:  Diagnosis Date  . Anxiety     Past Surgical History:  Procedure Laterality Date  . EXCISION MASS NECK Left 05/18/2018   Procedure: EXCISION LEFT LEVEL TWO NECK MASS;  Surgeon: Flo Shanks, MD;  Location: [facility] OR;  Service: ENT;  Laterality: Left;  . TONSILLECTOMY  05/03/2012   Procedure: TONSILLECTOMY;  Surgeon: Serena Colonel, MD;  Location: [facility];  Service: ENT;  Laterality: N/A;    There were no vitals filed for this visit.   Subjective Assessment - 03/22/20 0807    Subjective Patient reports he is feeling good today. He was in pain yesterday when he was doing work around his house, but today his pain has relieved. He states he has found a few exercises he is doing at home consistently that are helping.    Limitations Standing;Sitting;Walking;House hold activities    How long can you stand comfortably? 10 mins    How long can you walk comfortably? 10 mins    Currently in Pain? No/denies    Pain Score 1    "pain is much better today"   Pain Location Hip    Pain Orientation Right    Pain Descriptors / Indicators Aching;Sharp    Pain Type Chronic pain    Pain Onset More than a month ago    Pain Frequency Intermittent              [facility] PT Assessment - 03/22/20 0001       Assessment   Medical Diagnosis Pain in right leg     Referring Provider (PT) Cammy Copa, MD       AROM   Overall AROM Comments full active ROM of the right hip with minor pain at end range hip flexion       Strength   Overall Strength Comments hip flexion 4+/5 and hip abduction 4+/5; 5/5 for all other motions      Ambulation/Gait   Gait Comments 15 sec SLS time on R and L                         [facility] Adult PT Treatment/Exercise - 03/22/20 0001      Self-Care   Self-Care Other Self-Care Comments    Other Self-Care Comments  self trigger point release with tennis ball and thera cane; dry needling education      Lumbar Exercises: Aerobic   Nustep L4 x 4 min      Manual Therapy   Manual Therapy Soft tissue mobilization    Manual therapy comments Patient given information abvout trigger point dry needling     Soft tissue mobilization to lumbar spineand deep pressure to  QL and piriformis.                   PT Education - 03/22/20 1026    Education Details HEP; self trigger point release with thera-cane and tennis ball; how dry needling works    [employer]) Educated Patient    Methods Explanation;Demonstration;Tactile cues;Verbal cues    Comprehension Tactile cues required;Verbal cues required;Returned demonstration;Verbalized understanding            PT Short Term Goals - 03/22/20 1033      PT SHORT TERM GOAL #1   Title Patient will be independent w/ initial HEP    Baseline has basic HEP    Time 3    Period Weeks    Status Achieved   03/22/2020     PT SHORT TERM GOAL #2   Title Patient will report that he is able to walk and stand for >/= 15 mins in order to shop in store with less frequent stops    Baseline still continues to be able to walk only limied distances but improving    Time 3    Period Weeks    Status On-going      PT SHORT TERM GOAL #3   Title Patient will report a max 5/10 pain so that he is able to return to exercising per  patient's stated goals    Baseline continues to reach high levels but less frequrently and with increased activity level    Period Weeks    Status On-going             PT Long Term Goals - 03/22/20 1036      PT LONG TERM GOAL #1   Title Patient will return to light joggin without pain    Baseline can not jog but progressing towards single leg stability exercises    Time 6    Period Weeks    Status On-going    Target Date 04/05/20      PT LONG TERM GOAL #2   Title Patient will increase right single leg stance time to 30 dseconds in order to improve ability to walk and run    Baseline 10 seconds with instability compared to the left and with increased pain    Time 6    Period Weeks    Status On-going    Target Date 04/05/20                 Plan - 03/22/20 1027    Clinical Impression Statement Patient's pain had decreased significantly today. Therapy educated patient on how to do trigger point release at home using a tennis ball or thera-cane. Therapy discussed dry needling for the piriformis and pt is interested in trying it next session. Patient continues to have tightness in his low back and piriformis. Hip flexion and abduction strength was maintained. He continues to have minor pain at end range of hip flexion. Patient's SLS improved bilaterally. Patient would continue to benefit from skilled therapy for hip and glute strengthening to decrease pain and progress to higher level exercises to prepare him for running. His strength continues to be 4+/5 on the rightside. He continues to have minor pain at end range with right hip flexion. Therapy will continue to addresss strength.    Examination-Activity Limitations Lift;Sit;Stand    Examination-Participation Restrictions Other;Shop;Cleaning;Community Activity    Stability/Clinical Decision Making Stable/Uncomplicated    Clinical Decision Making Low    Rehab Potential Excellent    PT Frequency 1x /  week    PT Duration 3  weeks    PT Treatment/Interventions ADLs/Self Care Home Management;Electrical Stimulation;Cryotherapy;Iontophoresis 4mg /ml Dexamethasone;Moist Heat;Traction;Ultrasound;Gait training;Stair training;Functional mobility training;Therapeutic activities;Therapeutic exercise;Balance training;Neuromuscular re-education;Cognitive remediation;Patient/family education;Manual techniques;Passive range of motion;Dry needling;Taping;Joint Manipulations    PT Next Visit Plan dry needling; single leg stance exercises, progress strength exercises as tolerated    PT Home Exercise Plan [id number]+ piriformis stretch    Consulted and Agree with Plan of Care Patient           Patient will benefit from skilled therapeutic intervention in order to improve the following deficits and impairments:  Decreased activity tolerance, Decreased endurance, Decreased strength, Difficulty walking, Increased muscle spasms, Postural dysfunction, Improper body mechanics, Pain  Visit Diagnosis: Chronic right-sided low back pain, unspecified whether sciatica present  Pain in right hip  Piriformis syndrome of right side     Problem List Patient Active Problem List   Diagnosis Date Noted  . 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   05/23/2020 PT DPT  03/22/2020, 1:22 PM  [facility] [address] [place], [place], [place] Phone: [phone]   Fax:  [phone]  Name: Levi Johnston MRN: Fredirick Lathe Date of Birth: 11/29/1965

## 2020-03-30 ENCOUNTER — Other Ambulatory Visit: Payer: Self-pay

## 2020-03-30 ENCOUNTER — Ambulatory Visit: Payer: Medicaid Other | Admitting: Physical Therapy

## 2020-03-30 ENCOUNTER — Encounter: Payer: Self-pay | Admitting: Physical Therapy

## 2020-03-30 DIAGNOSIS — G5701 Lesion of sciatic nerve, right lower limb: Secondary | ICD-10-CM

## 2020-03-30 DIAGNOSIS — M545 Low back pain, unspecified: Secondary | ICD-10-CM

## 2020-03-30 DIAGNOSIS — G8929 Other chronic pain: Secondary | ICD-10-CM | POA: Diagnosis not present

## 2020-03-30 DIAGNOSIS — M25551 Pain in right hip: Secondary | ICD-10-CM | POA: Diagnosis not present

## 2020-03-30 NOTE — Therapy (Signed)
[facility] [address] [place], [place], [address] Phone: [phone]   Fax:  [phone]  Physical Therapy Treatment  Patient Details  Name: Levi Johnston MRN: [id number] Date of Birth: 1965/10/28 Referring Provider (PT): August Saucer Corrie Mckusick, MD    Encounter Date: 03/30/2020   PT End of Session - 03/30/20 0935    Visit Number 5    Number of Visits 10    Date for PT Re-Evaluation 05/03/20    Authorization Type MCD- [employer]    Authorization Time Period --    PT Start Time 0930    PT Stop Time 1015    PT Time Calculation (min) 45 min           Past Medical History:  Diagnosis Date  . Anxiety     Past Surgical History:  Procedure Laterality Date  . EXCISION MASS NECK Left 05/18/2018   Procedure: EXCISION LEFT LEVEL TWO NECK MASS;  Surgeon: Flo Shanks, MD;  Location: [facility] OR;  Service: ENT;  Laterality: Left;  . TONSILLECTOMY  05/03/2012   Procedure: TONSILLECTOMY;  Surgeon: Serena Colonel, MD;  Location: [facility];  Service: ENT;  Laterality: N/A;    There were no vitals filed for this visit.   Subjective Assessment - 03/30/20 0935    Subjective I was in a store with a hard floor and after 20 minutes I couldnt walk out of the store, I had to kneel on the floor. I had a couple of good days before then. I feel good today.    Currently in Pain? No/denies                             [facility] Adult PT Treatment/Exercise - 03/30/20 0001      Lumbar Exercises: Stretches   Active Hamstring Stretch Right;Left;2 reps;20 seconds    Double Knee to Chest Stretch 1 rep    ITB Stretch Right    ITB Stretch Limitations obers    Piriformis Stretch Left;Right;2 reps;30 seconds    Other Lumbar Stretch Exercise Figure 4 Piriformis Stretch in supine and sitting, 30 sec hold      Lumbar Exercises: Aerobic   Nustep L6 x 5 minutes Le only       Lumbar Exercises: Standing   Other Standing Lumbar  Exercises wall slides with blue band, lateral squat step 10 reps each way.monster walks 20 feet x 2       Lumbar Exercises: Supine   Clam Limitations 2x10 blue    Bridge Limitations Single Leg Bridges x15 right only    Bridge with clamshell 20 reps    Bridge with [employer] Limitations blue band      Manual Therapy   Soft tissue mobilization Prone active release with PROM hip IR/ER , sidelying STW to right QL, lumbar paraspinals                     PT Short Term Goals - 03/22/20 1033      PT SHORT TERM GOAL #1   Title Patient will be independent w/ initial HEP    Baseline has basic HEP    Time 3    Period Weeks    Status Achieved   03/22/2020     PT SHORT TERM GOAL #2   Title Patient will report that he is able to walk and stand for >/= 15 mins in order to shop in store  with less frequent stops    Baseline still continues to be able to walk only limied distances but improving    Time 3    Period Weeks    Status On-going      PT SHORT TERM GOAL #3   Title Patient will report a max 5/10 pain so that he is able to return to exercising per patient's stated goals    Baseline continues to reach high levels but less frequrently and with increased activity level    Period Weeks    Status On-going             PT Long Term Goals - 03/22/20 1036      PT LONG TERM GOAL #1   Title Patient will return to light joggin without pain    Baseline can not jog but progressing towards single leg stability exercises    Time 6    Period Weeks    Status On-going    Target Date 04/05/20      PT LONG TERM GOAL #2   Title Patient will increase right single leg stance time to 30 dseconds in order to improve ability to walk and run    Baseline 10 seconds with instability compared to the left and with increased pain    Time 6    Period Weeks    Status On-going    Target Date 04/05/20                 Plan - 03/30/20 1050    Clinical Impression Statement Pt arrives  reporting he has bad days with increased pain. He feels like something in his hip in grinding deep in the socket. Continued with hip and lumbar stabilization/ strengthening in closed chain and open chain. STW performed to right lumbar and lateral/posteio hip. He had mild increases in pain with therex today.    PT Next Visit Plan dry needling; single leg stance exercises, progress strength exercises as tolerated    PT Home Exercise Plan [id number]+ piriformis stretch           Patient will benefit from skilled therapeutic intervention in order to improve the following deficits and impairments:  Decreased activity tolerance, Decreased endurance, Decreased strength, Difficulty walking, Increased muscle spasms, Postural dysfunction, Improper body mechanics, Pain  Visit Diagnosis: Chronic right-sided low back pain, unspecified whether sciatica present  Pain in right hip  Piriformis syndrome of right side     Problem List Patient Active Problem List   Diagnosis Date Noted  . 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 [place], [place], [address] Phone: [phone]   Fax:  [phone]  Name: Levi Johnston MRN: [id number] Date of Birth: Dec 17, 1965

## 2020-04-01 DIAGNOSIS — Z20822 Contact with and (suspected) exposure to covid-19: Secondary | ICD-10-CM | POA: Diagnosis not present

## 2020-04-03 ENCOUNTER — Encounter: Payer: Self-pay | Admitting: Physical Therapy

## 2020-04-03 ENCOUNTER — Ambulatory Visit: Payer: Medicaid Other | Admitting: Physical Therapy

## 2020-04-03 ENCOUNTER — Other Ambulatory Visit: Payer: Self-pay

## 2020-04-03 DIAGNOSIS — M25551 Pain in right hip: Secondary | ICD-10-CM | POA: Diagnosis not present

## 2020-04-03 DIAGNOSIS — G8929 Other chronic pain: Secondary | ICD-10-CM

## 2020-04-03 DIAGNOSIS — G5701 Lesion of sciatic nerve, right lower limb: Secondary | ICD-10-CM | POA: Diagnosis not present

## 2020-04-03 DIAGNOSIS — M545 Low back pain: Secondary | ICD-10-CM | POA: Diagnosis not present

## 2020-04-03 NOTE — Therapy (Signed)
[facility] [address] [place], [place], [address] Phone: [phone]   Fax:  [phone]  Physical Therapy Treatment  Patient Details  Name: Levi Johnston MRN: [id number] Date of Birth: Feb 06, 1966 Referring Provider (PT): August Saucer Corrie Mckusick, MD    Encounter Date: 04/03/2020   PT End of Session - 04/03/20 0939    Visit Number 6    Number of Visits 10    Date for PT Re-Evaluation 05/03/20    Authorization Type MCD- [employer]    Authorization Time Period waiting on auth    PT Start Time 0930    PT Stop Time 1015    PT Time Calculation (min) 45 min           Past Medical History:  Diagnosis Date  . Anxiety     Past Surgical History:  Procedure Laterality Date  . EXCISION MASS NECK Left 05/18/2018   Procedure: EXCISION LEFT LEVEL TWO NECK MASS;  Surgeon: Flo Shanks, MD;  Location: [facility] OR;  Service: ENT;  Laterality: Left;  . TONSILLECTOMY  05/03/2012   Procedure: TONSILLECTOMY;  Surgeon: Serena Colonel, MD;  Location: [facility];  Service: ENT;  Laterality: N/A;    There were no vitals filed for this visit.   Subjective Assessment - 04/03/20 0937    Subjective I was a little sore but I think it is getting a little better. Stll increased pain with standing in the store on hard floor, makes me have to kneel in the store due to pain.    Currently in Pain? No/denies                             [facility] Adult PT Treatment/Exercise - 04/03/20 0001      Lumbar Exercises: Stretches   Active Hamstring Stretch Right;Left;2 reps;20 seconds    Standing Extension 5 reps    Quadruped Mid Back Stretch Limitations childs pose with lateral s    Piriformis Stretch Left;Right;2 reps;30 seconds    Gastroc Stretch 2 reps    Gastroc Stretch Limitations slant board    Other Lumbar Stretch Exercise Figure 4 Piriformis Stretch in supine and sitting, 30 sec hold      Lumbar Exercises: Aerobic    Elliptical R 3 R 3 x 5 minutes       Lumbar Exercises: Standing   Other Standing Lumbar Exercises wall slides with blue band, lateral squat step 10 reps each way.monster walks 20 feet x 2    green band today     Lumbar Exercises: Supine   Other Supine Lumbar Exercises 90/90 table top with heel taps       Lumbar Exercises: Quadruped   Other Quadruped Lumbar Exercises hip extension x 10, donkey kicks x 10  , fire hydrants x 10       Manual Therapy   Soft tissue mobilization Prone active release with PROM hip IR/ER , sidelying STW lower  lumbar paraspinals /QL                    PT Short Term Goals - 03/22/20 1033      PT SHORT TERM GOAL #1   Title Patient will be independent w/ initial HEP    Baseline has basic HEP    Time 3    Period Weeks    Status Achieved   03/22/2020     PT SHORT TERM GOAL #2   Title  Patient will report that he is able to walk and stand for >/= 15 mins in order to shop in store with less frequent stops    Baseline still continues to be able to walk only limied distances but improving    Time 3    Period Weeks    Status On-going      PT SHORT TERM GOAL #3   Title Patient will report a max 5/10 pain so that he is able to return to exercising per patient's stated goals    Baseline continues to reach high levels but less frequrently and with increased activity level    Period Weeks    Status On-going             PT Long Term Goals - 03/22/20 1036      PT LONG TERM GOAL #1   Title Patient will return to light joggin without pain    Baseline can not jog but progressing towards single leg stability exercises    Time 6    Period Weeks    Status On-going    Target Date 04/05/20      PT LONG TERM GOAL #2   Title Patient will increase right single leg stance time to 30 dseconds in order to improve ability to walk and run    Baseline 10 seconds with instability compared to the left and with increased pain    Time 6    Period Weeks    Status  On-going    Target Date 04/05/20                 Plan - 04/03/20 1222    Clinical Impression Statement Pt reports feeling a little better however still has pain with stanidng in stores with hard floors. Asked him to try standing lumbar extension upon onset. COntinued with hip and core stabilization. Began quadruped stabilization and instructed hip in some abdominal exercises that are less advanced than the leg lowers he has been doing on his own.He notes a popping sensation/clunk at right hip/low back when he does the leg lowers. I Asked him to discontinue that and try 90/90 table top heel taps instead.    PT Next Visit Plan dry needling; single leg stance exercises, progress strength exercises as tolerated    PT Home Exercise Plan [id number]+ piriformis stretch           Patient will benefit from skilled therapeutic intervention in order to improve the following deficits and impairments:  Decreased activity tolerance, Decreased endurance, Decreased strength, Difficulty walking, Increased muscle spasms, Postural dysfunction, Improper body mechanics, Pain  Visit Diagnosis: Chronic right-sided low back pain, unspecified whether sciatica present  Pain in right hip  Piriformis syndrome of right side     Problem List Patient Active Problem List   Diagnosis Date Noted  . 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 [place], [place], [address] Phone: [phone]   Fax:  [phone]  Name: Levi Johnston MRN: [id number] Date of Birth: 1965-10-17

## 2020-04-19 ENCOUNTER — Ambulatory Visit: Payer: Medicaid Other | Admitting: Physical Therapy

## 2020-04-26 ENCOUNTER — Ambulatory Visit: Payer: Medicaid Other | Admitting: Physical Therapy

## 2020-05-03 ENCOUNTER — Other Ambulatory Visit: Payer: Self-pay

## 2020-05-03 ENCOUNTER — Ambulatory Visit: Payer: Medicaid Other | Admitting: Physical Therapy

## 2020-05-03 ENCOUNTER — Encounter: Payer: Self-pay | Admitting: Physical Therapy

## 2020-05-03 ENCOUNTER — Ambulatory Visit: Payer: Medicaid Other | Attending: Orthopedic Surgery | Admitting: Physical Therapy

## 2020-05-03 DIAGNOSIS — G5701 Lesion of sciatic nerve, right lower limb: Secondary | ICD-10-CM | POA: Diagnosis not present

## 2020-05-03 DIAGNOSIS — M25551 Pain in right hip: Secondary | ICD-10-CM | POA: Diagnosis not present

## 2020-05-03 DIAGNOSIS — M545 Low back pain: Secondary | ICD-10-CM | POA: Diagnosis not present

## 2020-05-03 DIAGNOSIS — G8929 Other chronic pain: Secondary | ICD-10-CM | POA: Diagnosis not present

## 2020-05-03 NOTE — Therapy (Signed)
[place], [place], [address] Phone: [phone]   Fax:  [phone]  Physical Therapy Treatment/Discharge  Patient Details  Name: Levi Johnston MRN: [id number] Date of Birth: Dec 05, 1965 Referring Provider (PT): Marlou Sa Tonna Corner, MD    Encounter Date: 05/03/2020   PT End of Session - 05/03/20 0934    Visit Number 7    Number of Visits 10    Authorization Type MCD- [employer]    PT Start Time [phone]    PT Stop Time 1009    PT Time Calculation (min) 35 min    Activity Tolerance Patient tolerated treatment well    Behavior During Therapy [facility] for tasks assessed/performed           Past Medical History:  Diagnosis Date  . Anxiety     Past Surgical History:  Procedure Laterality Date  . EXCISION MASS NECK Left 05/18/2018   Procedure: EXCISION LEFT LEVEL TWO NECK MASS;  Surgeon: Jodi Marble, MD;  Location: [facility];  Service: ENT;  Laterality: Left;  . TONSILLECTOMY  05/03/2012   Procedure: TONSILLECTOMY;  Surgeon: Izora Gala, MD;  Location: [place];  Service: ENT;  Laterality: N/A;    There were no vitals filed for this visit.   Subjective Assessment - 05/03/20 0934    Subjective Pt reports [place] has been out of the country, had [place] long lay [place] in the airport and did a lot of walking and he suffered to the point where he couldn't even walk.  He has been home for two weeks and he is feeling better already. Today he is painless. Pain is intermittent now.  He is frustrated as he still will have intermittent pain that will sometimes make it hard to walk.    Patient Stated Goals "I need to have less pain"    Currently in Pain? No/denies   vaires from 0/10 up to 10/10.  The bad pain comes after 15-20 min on his feet.             [facility] PT Assessment - 05/03/20 0001      Assessment   Medical Diagnosis Pain in right leg     Referring Provider (PT) Marlou Sa Tonna Corner, MD        Functional Tests   Functional tests Squat;Single leg stance;Hopping      Squat   Comments slight shift Lt - good ROM and no pain      Single Leg Stance   Comments Lt/ Rt > 30 sec, increased accessory movement when on Rt LE.       Strength   Overall Strength Comments --   Rt hip ext 4+/5, abduction 5-/5, flex  4-/5 with pain      Flexibility   Soft Tissue Assessment /Muscle Length --   LE WNL.                         [facility] Adult PT Treatment/Exercise - 05/03/20 0001      Self-Care   Other Self-Care Comments  self TPR with tennis ball in supine to Rt QL.      Lumbar Exercises: Stretches   Other Lumbar Stretch Exercise catchers stretch and QL stertch at sink       Lumbar Exercises: Aerobic   Nustep L6 x 7 minutes Le only       Manual Therapy   Soft tissue mobilization STM and TPR to Rt QL in  prone    Manual Traction Rt LE long leg traction                    PT Short Term Goals - 05/03/20 0940      PT SHORT TERM GOAL #1   Title Patient will be independent w/ initial HEP    Status Achieved      PT SHORT TERM GOAL #2   Title Patient will report that he is able to walk and stand for >/= 15 mins in order to shop in store with less frequent stops    Status Achieved      PT [place] #3   Title Patient will report a max 5/10 pain so that he is able to return to exercising per patient's stated goals    Baseline pt will have pain up to 10/10 everytime he "physically labors"    Status Not Met             PT Long Term Goals - 05/03/20 0941      PT LONG TERM GOAL #1   Title Patient will return to light joggin without pain    Status Not Met      PT LONG TERM GOAL #2   Title Patient will increase right single leg stance time to 30 dseconds in order to improve ability to walk and run    Status Achieved                 Plan - 05/03/20 0941    Clinical Impression Statement Pt has partially met his goals, reports performing his HEP  faithfully and still has intermitent pain in the Rt low back, buttock and lateral thigh.  He has platued with progress and recommend he return to MD for further assessment.  Pt reports it was recommended he have a contrast MRI. If nothing shows the only option we could offer would be dry needling.  Pt is not interested in it at this time.    PT Next Visit Plan D/C to HEP    Consulted and Agree with Plan of Care Patient           Patient will benefit from skilled therapeutic intervention in order to improve the following deficits and impairments:     Visit Diagnosis: Chronic right-sided low back pain, unspecified whether sciatica present  Pain in right hip  Piriformis syndrome of right side     Problem List Patient Active Problem List   Diagnosis Date Noted  . 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 [place], [place], [address] Phone: [phone]   Fax:  [phone]  Name: Levi Johnston MRN: [id number] Date of Birth: 05/17/1966  PHYSICAL THERAPY DISCHARGE SUMMARY  Visits from Start of Care: 7  Current functional level related to goals / functional outcomes: Today was a good day for patient with no pain.  He does report pain up to 10/10 if he is on his feet longer than 15-20 min   Remaining deficits: See above   Education / Equipment: HEP Plan: Patient agrees to discharge.  Patient goals were partially met. Patient is being discharged due to lack of progress.  ?????   Jeral Pinch, PT 05/03/20 10:22 AM

## 2020-05-10 ENCOUNTER — Ambulatory Visit: Payer: Medicaid Other | Admitting: Physical Therapy

## 2020-05-22 ENCOUNTER — Ambulatory Visit (INDEPENDENT_AMBULATORY_CARE_PROVIDER_SITE_OTHER): Payer: Medicaid Other | Admitting: Family

## 2020-05-22 ENCOUNTER — Encounter: Payer: Self-pay | Admitting: Family

## 2020-05-22 VITALS — Ht 72.0 in | Wt 213.0 lb

## 2020-05-22 DIAGNOSIS — M5441 Lumbago with sciatica, right side: Secondary | ICD-10-CM | POA: Diagnosis not present

## 2020-05-22 DIAGNOSIS — G8929 Other chronic pain: Secondary | ICD-10-CM

## 2020-05-22 MED ORDER — PREDNISONE 50 MG PO TABS: ORAL_TABLET | ORAL | 0 refills | Status: DC

## 2020-05-22 NOTE — Progress Notes (Signed)
Office Visit Note   Patient: Levi Johnston           Date of Birth: 05-04-1966           MRN: [id number] Visit Date: 05/22/2020              Requested by: No referring provider defined for this encounter. PCP: Patient, No Pcp Per  Chief Complaint  Patient presents with  . Lower Back - Pain      HPI: The patient is a 54 year old gentleman who presents today in follow-up for ongoing low back pain.  This is been ongoing for over a year now.  He is having radicular type pain in the right low back and buttock.  This occasionally shoots down the posterior thigh sometimes even into the calf.  Unable to do prolonged standing and walking even greater than 10 minutes due to pain.  States sitting on the edge of his seat helps.  Unfortunately this has interfered with his ability to work.  He has tried anti-inflammatories as well as activity modification and home exercise programs without any relief.  He has now completed about 2 months of physical therapy with very very modest improvement.   Assessment & Plan: Visit Diagnoses:  1. Chronic right-sided low back pain with right-sided sciatica     Plan: We will again try to get the MRI of his low back approved.  He will follow-up with Dr. August Saucer for MRI review.  Follow-Up Instructions: No follow-ups on file.   Back Exam   Tenderness  The patient is experiencing no tenderness.   Range of Motion  The patient has normal back ROM.  Muscle Strength  The patient has normal back strength.      Patient is alert, oriented, no adenopathy, well-dressed, normal affect, normal respiratory effort. Positive nerve root tension signs on the right negative on the left.  No real groin pain with internal rotation of the leg.  Does have mild pain with forward lateral bending.  Imaging: No results found. No images are attached to the encounter.  Labs: No results found for: HGBA1C, ESRSEDRATE, CRP, LABURIC, REPTSTATUS, GRAMSTAIN, CULT,  LABORGA   Lab Results  Component Value Date   ALBUMIN 3.9 12/05/2019    No results found for: MG No results found for: VD25OH  No results found for: PREALBUMIN CBC EXTENDED Latest Ref Rng & Units 12/05/2019 05/14/2018 04/12/2018  WBC 4.0 - 10.5 K/uL 8.6 8.8 7.4  RBC 4.22 - 5.81 MIL/uL 5.82(H) 5.73 5.56  HGB 13.0 - 17.0 g/dL 17.1(H) 16.9 16.2  HCT 39 - 52 % 51.9 51.6 49.2  PLT 150 - 400 K/uL 310 256 282     Body mass index is 28.89 kg/m.  Orders:  Orders Placed This Encounter  Procedures  . MR Lumbar Spine w/o contrast   Meds ordered this encounter  Medications  . predniSONE (DELTASONE) 50 MG tablet    Sig: Take one tablet by mouth once daily for 5 days.    Dispense:  5 tablet    Refill:  0     Procedures: No procedures performed  Clinical Data: No additional findings.  ROS:  All other systems negative, except as noted in the HPI. Review of Systems  Objective: Vital Signs: Ht 6' (1.829 m)   Wt 213 lb (96.6 kg)   BMI 28.89 kg/m   Specialty Comments:  No specialty comments available.  PMFS History: Patient Active Problem List   Diagnosis Date Noted  . Branchial cleft  cyst 05/18/2018   Past Medical History:  Diagnosis Date  . Anxiety     No family history on file.  Past Surgical History:  Procedure Laterality Date  . EXCISION MASS NECK Left 05/18/2018   Procedure: EXCISION LEFT LEVEL TWO NECK MASS;  Surgeon: Flo Shanks, MD;  Location: [facility] OR;  Service: ENT;  Laterality: Left;  . TONSILLECTOMY  05/03/2012   Procedure: TONSILLECTOMY;  Surgeon: Serena Colonel, MD;  Location: [facility];  Service: ENT;  Laterality: N/A;   Social History   Occupational History  . Not on file  Tobacco Use  . Smoking status: Current Every Day Smoker    Packs/day: 1.00    Years: 30.00    Pack years: 30.00    Types: Cigarettes  . Smokeless tobacco: Never Used  Vaping Use  . Vaping Use: Never used  Substance and Sexual Activity  . Alcohol use: Not  Currently    Comment: drinks very rare  . Drug use: Not Currently    Types: Marijuana, Cocaine    Comment: nothing recently  . Sexual activity: Not on file    Comment: smokes  1 pack a day

## 2020-05-23 ENCOUNTER — Encounter: Payer: Self-pay | Admitting: Family

## 2020-05-24 DIAGNOSIS — Z20822 Contact with and (suspected) exposure to covid-19: Secondary | ICD-10-CM | POA: Diagnosis not present

## 2020-06-01 ENCOUNTER — Telehealth: Payer: Self-pay | Admitting: Orthopedic Surgery

## 2020-06-01 NOTE — Telephone Encounter (Signed)
2x message left for pt to call and set MRI review appt with Dr. Lajoyce Corners.

## 2020-06-09 ENCOUNTER — Ambulatory Visit
Admission: RE | Admit: 2020-06-09 | Discharge: 2020-06-09 | Disposition: A | Discharge status: Home / Self Care | Payer: Medicaid Other | Source: Ambulatory Visit | Attending: Family | Admitting: Family

## 2020-06-09 DIAGNOSIS — M5441 Lumbago with sciatica, right side: Secondary | ICD-10-CM

## 2020-06-09 DIAGNOSIS — M48061 Spinal stenosis, lumbar region without neurogenic claudication: Secondary | ICD-10-CM | POA: Diagnosis not present

## 2020-06-12 ENCOUNTER — Ambulatory Visit (INDEPENDENT_AMBULATORY_CARE_PROVIDER_SITE_OTHER): Payer: Medicaid Other | Admitting: Orthopedic Surgery

## 2020-06-12 ENCOUNTER — Encounter: Payer: Self-pay | Admitting: Orthopedic Surgery

## 2020-06-12 DIAGNOSIS — G8929 Other chronic pain: Secondary | ICD-10-CM | POA: Diagnosis not present

## 2020-06-12 DIAGNOSIS — M5441 Lumbago with sciatica, right side: Secondary | ICD-10-CM | POA: Diagnosis not present

## 2020-06-13 ENCOUNTER — Encounter: Payer: Self-pay | Admitting: Orthopedic Surgery

## 2020-06-13 NOTE — Progress Notes (Signed)
Office Visit Note   Patient: Levi Johnston           Date of Birth: 1965-10-08           MRN: [id number] Visit Date: 06/12/2020              Requested by: No referring provider defined for this encounter. PCP: Patient, No Pcp Per  Chief Complaint  Patient presents with   Lower Back - Follow-up      HPI: Patient is a 54 year old gentleman who presents in follow-up status post MRI scan of the lumbar spine.  Patient states that his symptoms radiate from the lumbar spine to the lateral aspect of the right hip and right gluteus muscles.  Assessment & Plan: Visit Diagnoses:  1. Chronic right-sided low back pain with right-sided sciatica     Plan: Discussed treatment options with oral prednisone versus a transforaminal injection.  Patient states he is not ready to consider a transforaminal injection at this time.  Discussed conservative treatment options and patient will call when he is ready to proceed with transforaminal injection we will set him up with Dr. Alvester Morin for consultation at that time.  Follow-Up Instructions: Return if symptoms worsen or fail to improve.   Ortho Exam  Patient is alert, oriented, no adenopathy, well-dressed, normal affect, normal respiratory effort. Examination patient has a negative straight leg raise bilaterally motor strength is 5/5 all motor groups of both lower extremities no pain with range of motion of the hip knee or ankle.  Review of the MRI scan shows grade 1 spondylolisthesis with impingement at L4-5 to the right involving the L4 nerve root.  Imaging: No results found. No images are attached to the encounter.  Labs: No results found for: HGBA1C, ESRSEDRATE, CRP, LABURIC, REPTSTATUS, GRAMSTAIN, CULT, LABORGA   Lab Results  Component Value Date   ALBUMIN 3.9 12/05/2019    No results found for: MG No results found for: VD25OH  No results found for: PREALBUMIN CBC EXTENDED Latest Ref Rng & Units 12/05/2019 05/14/2018 04/12/2018  WBC 4.0  - 10.5 K/uL 8.6 8.8 7.4  RBC 4.22 - 5.81 MIL/uL 5.82(H) 5.73 5.56  HGB 13.0 - 17.0 g/dL 17.1(H) 16.9 16.2  HCT 39 - 52 % 51.9 51.6 49.2  PLT 150 - 400 K/uL 310 256 282     There is no height or weight on file to calculate BMI.  Orders:  No orders of the defined types were placed in this encounter.  No orders of the defined types were placed in this encounter.    Procedures: No procedures performed  Clinical Data: No additional findings.  ROS:  All other systems negative, except as noted in the HPI. Review of Systems  Objective: Vital Signs: There were no vitals taken for this visit.  Specialty Comments:  No specialty comments available.  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 No pertinent family history.  Past Surgical History:  Procedure Laterality Date   EXCISION MASS NECK Left 05/18/2018   Procedure: EXCISION LEFT LEVEL TWO NECK MASS;  Surgeon: Flo Shanks, MD;  Location: [facility] OR;  Service: ENT;  Laterality: Left;   TONSILLECTOMY  05/03/2012   Procedure: TONSILLECTOMY;  Surgeon: Serena Colonel, MD;  Location: [facility];  Service: ENT;  Laterality: N/A;   Social History   Occupational History  Not on file  Tobacco Use   Smoking status: Current Every Day Smoker    Packs/day: 1.00    Years: 30.00    Pack years: 30.00    Types: Cigarettes   Smokeless tobacco: Never Used  Vaping Use   Vaping Use: Never used  Substance and Sexual Activity   Alcohol use: Not Currently    Comment: drinks very rare   Drug use: Not Currently    Types: Marijuana, Cocaine    Comment: nothing recently   Sexual activity: Not on file    Comment: smokes  1 pack a day

## 2021-08-20 ENCOUNTER — Ambulatory Visit (INDEPENDENT_AMBULATORY_CARE_PROVIDER_SITE_OTHER): Payer: Medicaid Other | Admitting: Nurse Practitioner

## 2021-08-20 ENCOUNTER — Other Ambulatory Visit: Payer: Self-pay

## 2021-08-20 ENCOUNTER — Encounter: Payer: Self-pay | Admitting: Nurse Practitioner

## 2021-08-20 VITALS — BP 119/80 | HR 91 | Temp 98.4°F | Ht 72.0 in | Wt 201.9 lb

## 2021-08-20 DIAGNOSIS — Z7689 Persons encountering health services in other specified circumstances: Secondary | ICD-10-CM

## 2021-08-20 DIAGNOSIS — M25512 Pain in left shoulder: Secondary | ICD-10-CM | POA: Diagnosis not present

## 2021-08-20 DIAGNOSIS — Z6827 Body mass index (BMI) 27.0-27.9, adult: Secondary | ICD-10-CM | POA: Diagnosis not present

## 2021-08-20 DIAGNOSIS — M5136 Other intervertebral disc degeneration, lumbar region: Secondary | ICD-10-CM | POA: Diagnosis not present

## 2021-08-20 DIAGNOSIS — M5030 Other cervical disc degeneration, unspecified cervical region: Secondary | ICD-10-CM | POA: Diagnosis not present

## 2021-08-20 DIAGNOSIS — Z87891 Personal history of nicotine dependence: Secondary | ICD-10-CM

## 2021-08-20 DIAGNOSIS — Z720 Tobacco use: Secondary | ICD-10-CM | POA: Diagnosis not present

## 2021-08-20 DIAGNOSIS — G8929 Other chronic pain: Secondary | ICD-10-CM | POA: Diagnosis not present

## 2021-08-20 NOTE — Progress Notes (Signed)
New Patient Office Visit  Subjective:  Patient ID: Levi Johnston, male    DOB: 1965-11-30  Age: 55 y.o. MRN: [id number]  CC:  Chief Complaint  Patient presents with   New Patient (Initial Visit)    HPI Levi Johnston presents to establish new primary care provider. States that he ha been a smoker for 35 years. Smokes over a pack of cigarettes per day. Does have some intermittent shortness of breath, but nothing "out of the ordinary." States that his mother passed away from breast cancer which metastasized into the lung. He states that he would like to be evaluated for cancer.  States he has not had primary care provider for some time.  Promises daughter he would get a primary provider and get his health on track. States that his chronic shoulder pain on the left side.  Hurts when he lifts his arm up above 90 degrees.  Unable to reach behind his back.  He denies specific injury or trauma to the area.  He also has pain in his cervical spine.  This is most severe on the left side.  Again, denies specific trauma or injury to his neck.  He states he has not seen orthopedic provider for this.  Past Medical History:  Diagnosis Date   Anxiety     Past Surgical History:  Procedure Laterality Date   EXCISION MASS NECK Left 05/18/2018   Procedure: EXCISION LEFT LEVEL TWO NECK MASS;  Surgeon: Flo Shanks, MD;  Location: [facility] OR;  Service: ENT;  Laterality: Left;   TONSILLECTOMY  05/03/2012   Procedure: TONSILLECTOMY;  Surgeon: Serena Colonel, MD;  Location: [facility];  Service: ENT;  Laterality: N/A;    Family History  Problem Relation Age of Onset   Breast cancer Mother    Stroke Brother    Throat cancer Brother     Social History   Socioeconomic History   Marital status: Divorced    Spouse name: Not on file   Number of children: Not on file   Years of education: Not on file   Highest education level: Not on file  Occupational History   Not on file  Tobacco Use    Smoking status: Every Day    Packs/day: 1.00    Years: 30.00    Pack years: 30.00    Types: Cigarettes   Smokeless tobacco: Never  Vaping Use   Vaping Use: Never used  Substance and Sexual Activity   Alcohol use: Not Currently    Comment: drinks very rare   Drug use: Not Currently    Types: Marijuana, Cocaine    Comment: nothing recently   Sexual activity: Not on file    Comment: smokes  1 pack a day  Other Topics Concern   Not on file  Social History Narrative   Not on file   Social Determinants of Health   Financial Resource Strain: Not on file  Food Insecurity: Not on file  Transportation Needs: Not on file  Physical Activity: Not on file  Stress: Not on file  Social Connections: Not on file  Intimate Partner Violence: Not on file    ROS Review of Systems  Constitutional:  Positive for fatigue. Negative for activity change, chills and fever.  HENT:  Negative for congestion, postnasal drip, rhinorrhea, sinus pressure, sinus pain, sneezing and sore throat.   Eyes: Negative.   Respiratory:  Positive for cough. Negative for shortness of breath and wheezing.   Cardiovascular:  Negative for chest  pain and palpitations.  Gastrointestinal:  Negative for constipation, diarrhea, nausea and vomiting.  Endocrine: Negative for cold intolerance, heat intolerance, polydipsia and polyuria.  Genitourinary:  Negative for dysuria, frequency and urgency.  Musculoskeletal:  Positive for arthralgias, back pain and myalgias.  Skin:  Negative for rash.  Allergic/Immunologic: Negative for environmental allergies.  Neurological:  Negative for dizziness, weakness and headaches.  Psychiatric/Behavioral:  The patient is not nervous/anxious.    Objective:   Today's Vitals   08/20/21 1536  BP: 119/80  Pulse: 91  Temp: 98.4 F (36.9 C)  SpO2: 98%  Weight: 201 lb 14.4 oz (91.6 kg)  Height: 6' (1.829 m)   Body mass index is 27.38 kg/m.   Physical Exam Vitals and nursing note  reviewed.  Constitutional:      Appearance: Normal appearance. He is well-developed.  HENT:     Head: Normocephalic and atraumatic.     Nose: Nose normal.     Mouth/Throat:     Mouth: Mucous membranes are moist.     Pharynx: Oropharynx is clear.  Eyes:     Extraocular Movements: Extraocular movements intact.     Conjunctiva/sclera: Conjunctivae normal.     Pupils: Pupils are equal, round, and reactive to light.  Cardiovascular:     Rate and Rhythm: Normal rate and regular rhythm.     Pulses: Normal pulses.     Heart sounds: Normal heart sounds.  Pulmonary:     Effort: Pulmonary effort is normal.     Breath sounds: Normal breath sounds.     Comments: Congested, loose sounding cough.  Nonproductive. Abdominal:     Palpations: Abdomen is soft.  Musculoskeletal:     Left shoulder: Tenderness present. Decreased range of motion. Decreased strength. Normal pulse.     Cervical back: Neck supple. Pain with movement and muscular tenderness present. Decreased range of motion.  Lymphadenopathy:     Cervical: No cervical adenopathy.  Skin:    General: Skin is warm and dry.     Capillary Refill: Capillary refill takes less than 2 seconds.  Neurological:     General: No focal deficit present.     Mental Status: He is alert and oriented to person, place, and time.  Psychiatric:        Mood and Affect: Mood normal.        Behavior: Behavior normal.        Thought Content: Thought content normal.        Judgment: Judgment normal.    Assessment & Plan:  1. Encounter to establish care Appointment today to establish new primary care provider    2. Degenerative disc disease, cervical Will get x-ray of cervical spine.  Refer to orthopedics for further evaluation. - DG Cervical Spine 2 or 3 views; Future - Ambulatory referral to Orthopedic Surgery  3. Lumbar degenerative disc disease Refer to orthopedics for evaluation and management of degenerative disc disease.  4. Chronic left  shoulder pain Will get x-ray of left shoulder for further evaluation.  Recommend he ice his shoulder when pain is severe.  Take Tylenol and/or ibuprofen to relieve pain and inflammation.  Refer to orthopedics for further evaluation. - DG Shoulder Left; Future - Ambulatory referral to Orthopedic Surgery  5. History of smoking at least 1 pack per day for at least 30 years Long history of smoking.  Continues to smoke more than 1 pack of cigarettes per day.  We will get CT chest (lung cancer screening) for further evaluation. - CT  CHEST LUNG CANCER SCREENING LOW DOSE WO CONTRAST; Future  6. Tobacco abuse Recommend smoking cessation.  Patient not ready to quit at this time.  7. Body mass index 27.0-27.9, adult Encourage patient to limit calorie intake to 2000 cal/day or less.  He should consume a low cholesterol, low-fat diet.  Patient should incorporate exercise into his daily routine.     Problem List Items Addressed This Visit       Musculoskeletal and Integument   Degenerative disc disease, cervical   Relevant Orders   DG Cervical Spine 2 or 3 views   Ambulatory referral to Orthopedic Surgery   Lumbar degenerative disc disease     Other   Chronic left shoulder pain   Relevant Orders   DG Shoulder Left   Ambulatory referral to Orthopedic Surgery   History of smoking at least 1 pack per day for at least 30 years   Relevant Orders   CT CHEST LUNG CANCER SCREENING LOW DOSE WO CONTRAST   Tobacco abuse   Body mass index 27.0-27.9, adult   Other Visit Diagnoses     Encounter to establish care    -  Primary       Outpatient Encounter Medications as of 08/20/2021  Medication Sig   diclofenac Sodium (VOLTAREN) 1 % GEL Apply 2 g topically 4 (four) times daily.   HYDROcodone-acetaminophen (NORCO/VICODIN) 5-325 MG tablet Take 1-2 tablets by mouth every 4 (four) hours as needed for moderate pain.   predniSONE (DELTASONE) 50 MG tablet Take one tablet by mouth once daily for 5 days.    No facility-administered encounter medications on file as of 08/20/2021.    Follow-up: Return in about 6 weeks (around 10/01/2021) for health maintenance exam, FBW a week prior to visit add PSA .   Carlean Jews, NP  This note was dictated using Conservation officer, historic buildings. Rapid proofreading was performed to expedite the delivery of the information. Despite proofreading, phonetic errors will occur which are common with this voice recognition software. Please take this into consideration. If there are any concerns, please contact our office.

## 2021-08-22 ENCOUNTER — Other Ambulatory Visit: Payer: Self-pay

## 2021-08-22 DIAGNOSIS — M79604 Pain in right leg: Secondary | ICD-10-CM

## 2021-08-22 DIAGNOSIS — G8929 Other chronic pain: Secondary | ICD-10-CM

## 2021-08-25 DIAGNOSIS — G8929 Other chronic pain: Secondary | ICD-10-CM | POA: Insufficient documentation

## 2021-08-25 DIAGNOSIS — M5030 Other cervical disc degeneration, unspecified cervical region: Secondary | ICD-10-CM | POA: Insufficient documentation

## 2021-08-25 DIAGNOSIS — Z720 Tobacco use: Secondary | ICD-10-CM | POA: Insufficient documentation

## 2021-08-25 DIAGNOSIS — Z87891 Personal history of nicotine dependence: Secondary | ICD-10-CM | POA: Insufficient documentation

## 2021-08-25 DIAGNOSIS — Z6827 Body mass index (BMI) 27.0-27.9, adult: Secondary | ICD-10-CM | POA: Insufficient documentation

## 2021-08-25 DIAGNOSIS — Z6828 Body mass index (BMI) 28.0-28.9, adult: Secondary | ICD-10-CM | POA: Insufficient documentation

## 2021-08-25 DIAGNOSIS — M5136 Other intervertebral disc degeneration, lumbar region: Secondary | ICD-10-CM | POA: Insufficient documentation

## 2021-08-25 DIAGNOSIS — M25512 Pain in left shoulder: Secondary | ICD-10-CM | POA: Insufficient documentation

## 2021-08-25 NOTE — Patient Instructions (Signed)
Fat and Cholesterol Restricted Eating Plan Getting too much fat and cholesterol in your diet may cause health problems. Choosing the right foods helps keep your fat and cholesterol at normal levels. This can keep you from getting certain diseases. Your doctor may recommend an eating plan that includes: Total fat: ______% or less of total calories a day. This is ______g of fat a day. Saturated fat: ______% or less of total calories a day. This is ______g of saturated fat a day. Cholesterol: less than _________mg a day. Fiber: ______g a day. What are tips for following this plan? General tips Work with your doctor to lose weight if you need to. Avoid: Foods with added sugar. Fried foods. Foods with trans fat or partially hydrogenated oils. This includes some margarines and baked goods. If you drink alcohol: Limit how much you have to: 0-1 drink a day for women who are not pregnant. 0-2 drinks a day for men. Know how much alcohol is in a drink. In the [place] one drink equals one 12 oz bottle of beer (355 mL), one 5 oz glass of wine (148 mL), or one 1 oz glass of hard liquor (44 mL). Reading food labels Check food labels for: Trans fats. Partially hydrogenated oils. Saturated fat (g) in each serving. Cholesterol (mg) in each serving. Fiber (g) in each serving. Choose foods with healthy fats, such as: Monounsaturated fats and polyunsaturated fats. These include olive and canola oil, flaxseeds, walnuts, almonds, and seeds. Omega-3 fats. These are found in certain fish, flaxseed oil, and ground flaxseeds. Choose grain products that have whole grains. Look for the word "whole" as the first word in the ingredient list. Cooking Cook foods using low-fat methods. These include baking, boiling, grilling, and broiling. Eat more home-cooked foods. Eat at restaurants and buffets less often. Eat less fast food. Avoid cooking using saturated fats, such as butter, cream, palm oil, palm kernel oil, and  coconut oil. Meal planning  At meals, divide your plate into four equal parts: Fill one-half of your plate with vegetables, green salads, and fruit. Fill one-fourth of your plate with whole grains. Fill one-fourth of your plate with low-fat (lean) protein foods. Eat fish that is high in omega-3 fats at least two times a week. This includes mackerel, tuna, sardines, and salmon. Eat foods that are high in fiber, such as whole grains, beans, apples, pears, berries, broccoli, carrots, peas, and barley. What foods should I eat? Fruits All fresh, canned (in natural juice), or frozen fruits. Vegetables Fresh or frozen vegetables (raw, steamed, roasted, or grilled). Green salads. Grains Whole grains, such as whole wheat or whole grain breads, crackers, cereals, and pasta. Unsweetened oatmeal, bulgur, barley, quinoa, or brown rice. Corn or whole wheat flour tortillas. Meats and other protein foods Ground beef (85% or leaner), grass-fed beef, or beef trimmed of fat. Skinless chicken or turkey. Ground chicken or turkey. Pork trimmed of fat. All fish and seafood. Egg whites. Dried beans, peas, or lentils. Unsalted nuts or seeds. Unsalted canned beans. Nut butters without added sugar or oil. Dairy Low-fat or nonfat dairy products, such as skim or 1% milk, 2% or reduced-fat cheeses, low-fat and fat-free ricotta or cottage cheese, or plain low-fat and nonfat yogurt. Fats and oils Tub margarine without trans fats. Light or reduced-fat mayonnaise and salad dressings. Avocado. Olive, canola, sesame, or safflower oils. The items listed above may not be a complete list of foods and beverages you can eat. Contact a dietitian for more information. What foods   should I avoid? Fruits Canned fruit in heavy syrup. Fruit in cream or butter sauce. Fried fruit. Vegetables Vegetables cooked in cheese, cream, or butter sauce. Fried vegetables. Grains White bread. White pasta. White rice. Cornbread. Bagels, pastries,  and croissants. Crackers and snack foods that contain trans fat and hydrogenated oils. Meats and other protein foods Fatty cuts of meat. Ribs, chicken wings, bacon, sausage, bologna, salami, chitterlings, fatback, hot dogs, bratwurst, and packaged lunch meats. Liver and organ meats. Whole eggs and egg yolks. Chicken and turkey with skin. Fried meat. Dairy Whole or 2% milk, cream, half-and-half, and cream cheese. Whole milk cheeses. Whole-fat or sweetened yogurt. Full-fat cheeses. Nondairy creamers and whipped toppings. Processed cheese, cheese spreads, and cheese curds. Fats and oils Butter, stick margarine, lard, shortening, ghee, or bacon fat. Coconut, palm kernel, and palm oils. Beverages Alcohol. Sugar-sweetened drinks such as sodas, lemonade, and fruit drinks. Sweets and desserts Corn syrup, sugars, honey, and molasses. Candy. Jam and jelly. Syrup. Sweetened cereals. Cookies, pies, cakes, donuts, muffins, and ice cream. The items listed above may not be a complete list of foods and beverages you should avoid. Contact a dietitian for more information. Summary Choosing the right foods helps keep your fat and cholesterol at normal levels. This can keep you from getting certain diseases. At meals, fill one-half of your plate with vegetables, green salads, and fruits. Eat high fiber foods, like whole grains, beans, apples, pears, berries, carrots, peas, and barley. Limit added sugar, saturated fats, alcohol, and fried foods. This information is not intended to replace advice given to you by your health care provider. Make sure you discuss any questions you have with your health care provider. Document Revised: 01/11/2021 Document Reviewed: 01/11/2021 Elsevier Patient Education  2022 Elsevier Inc.  

## 2021-09-02 ENCOUNTER — Ambulatory Visit: Payer: Medicaid Other | Admitting: Physical Medicine and Rehabilitation

## 2021-09-03 ENCOUNTER — Other Ambulatory Visit: Payer: Self-pay

## 2021-09-03 ENCOUNTER — Encounter: Payer: Self-pay | Admitting: Physical Medicine and Rehabilitation

## 2021-09-03 ENCOUNTER — Ambulatory Visit (INDEPENDENT_AMBULATORY_CARE_PROVIDER_SITE_OTHER): Payer: Medicaid Other | Admitting: Physical Medicine and Rehabilitation

## 2021-09-03 VITALS — BP 144/94 | HR 85

## 2021-09-03 DIAGNOSIS — M5416 Radiculopathy, lumbar region: Secondary | ICD-10-CM

## 2021-09-03 DIAGNOSIS — G8929 Other chronic pain: Secondary | ICD-10-CM

## 2021-09-03 DIAGNOSIS — M4306 Spondylolysis, lumbar region: Secondary | ICD-10-CM

## 2021-09-03 DIAGNOSIS — M4726 Other spondylosis with radiculopathy, lumbar region: Secondary | ICD-10-CM

## 2021-09-03 DIAGNOSIS — M25511 Pain in right shoulder: Secondary | ICD-10-CM | POA: Diagnosis not present

## 2021-09-03 NOTE — Progress Notes (Signed)
Levi Johnston - 55 y.o. male MRN [id number]  Date of birth: 11-Dec-1965  Office Visit Note: Visit Date: 09/03/2021 PCP: Carlean Jews, NP Referred by: Carlean Jews, NP  Subjective: Chief Complaint  Patient presents with   Lower Back - Pain   Right Hip - Pain   Left Shoulder - Pain   Left Elbow - Numbness   Left Hand - Numbness   HPI: Levi Johnston is a 55 y.o. male who comes in today per the request of Dr. Vincent Gros, NP for evaluation of chronic, worsening and severe right sided lower back pain radiating to hip and down lateral leg. Patient reports pain has been ongoing for several months. Patient reports pain is exacerbated by standing, walking and activity. Patient describes pain as a sharp and sore sensation, currently rates as 8 out of 10. He reports some relief of pain with home exercise regimen, rest and use of OTC medications. Patient's lumbar MRI from 2021 exhibits bilateral pars defect at L4 with associated 71mm spondylolisthesis. There is also right foraminal disc protrusion at L4-L5 causing irritation of right L4 nerve root. No high grade spinal stenosis noted. Patient states he did attend formal physical therapy for chronic back issues at [facility] in 2021 which he reports did not help to alleviate pain. Patient states he has been dealing with a lot emotionally and physically over the past several years, reports death of his mother and brother caused him severe depression. Patient states he has a high pain tolerance, however over the past several months his pain is causing him to become more sedentary. Patient denies focal weakness, numbness and tingling. Patient denies recent trauma or falls.   Incidentally, patient also reports chronic, worsening and severe right sided shoulder pain. Patient reports pain has been ongoing for 6 months. Patient states pain is exacerbated with reaching, movement and activity, describes as a soreness  sensation, currently rates as 7 out of 10. Patient reports intermittent numbness to hand and all fingers as well. Patient has no history of formal evaluation of right shoulder issues.   Patient denies focal weakness, numbness and tingling. Patient denies recent trauma or falls.   Review of Systems  Musculoskeletal:  Positive for back pain and joint pain.  Neurological:  Positive for tingling. Negative for focal weakness and weakness.  All other systems reviewed and are negative. Otherwise per HPI.  Assessment & Plan: Visit Diagnoses:    ICD-10-CM   1. Lumbar radiculopathy  M54.16     2. Other spondylosis with radiculopathy, lumbar region  M47.26     3. Pars defect of lumbar spine  M43.06     4. Chronic right shoulder pain  M25.511    G89.29        Plan: Findings:  1. Chronic, worsening and severe right sided lower back pain radiating to hip and down lateral leg. Patient continues to have excruciating pain despite good conservative therapies such as home exercise regimen, formal physical therapy, and use of medications. Patient's clinical presentation and exam are consistent with L4/L5 nerve pattern. We believe the next step is to perform a diagnostic and hopefully therapeutic right L4 transforaminal epidural steroid injection under fluoroscopic guidance which we did offer patient today. At this time, patient wishes to hold off on scheduling injection as he voices severe anxiety related to needles and procedures. Patient instructed to let us know if and when he would like to proceed with epidural injection.  2. Chronic,  worsening and severe right sided shoulder pain. We believe the next step is to arrange follow-up with orthopedist for formal evaluation of right shoulder pain. We were able to get patient scheduled with Dr. Aldean Baker today for appointment on 09/12/21. He was seen last year by Dr. Lajoyce Corners and Barnie Del, FNP.  Patient encouraged to remain active and to continue home exercise  regimen as tolerated. No red flag symptoms noted upon exam today.    Meds & Orders: No orders of the defined types were placed in this encounter.  No orders of the defined types were placed in this encounter.   Follow-up: Return if symptoms worsen or fail to improve.   Procedures: No procedures performed      Clinical History: MRI LUMBAR SPINE WITHOUT CONTRAST   TECHNIQUE: Multiplanar, multisequence MR imaging of the lumbar spine was performed. No intravenous contrast was administered.   COMPARISON:  Prior radiograph from 12/21/2019.   FINDINGS: Segmentation: Standard. Bulge well-formed disc space labeled the L5-S1 level.   Alignment: Chronic bilateral pars defects at L4 with associated 4 mm spondylolisthesis. Alignment otherwise normal with preservation of the normal lumbar lordosis.   Vertebrae: Vertebral body height maintained without acute or chronic fracture. Bone marrow signal intensity within normal limits. Few scattered benign hemangiomata noted. No worrisome osseous lesions. No abnormal marrow edema.   Conus medullaris and cauda equina: Conus extends to the T12-L1 level. Conus and cauda equina appear normal.   Paraspinal and other soft tissues: Paraspinous soft tissues within normal limits. Few tiny simple cyst noted within the kidneys bilaterally. Visualized visceral structures otherwise unremarkable. Retroaortic left renal vein noted.   Disc levels:   L1-2: Small chronic endplate Schmorl's node deformity without disc bulge. No stenosis or impingement.   L2-3: Small chronic endplate Schmorl's node deformity without significant disc bulge. No stenosis.   L3-4: Normal interspace. Mild to moderate facet hypertrophy, slightly greater on the right. No spinal stenosis. Foramina remain patent.   L4-5: Chronic bilateral pars defects with associated 4 mm spondylolisthesis. Associated broad posterior pseudo disc bulge/uncovering. Superimposed right foraminal  disc protrusion impinges upon the transiting right L4 nerve root in the right neural foramen (series 4, image 5). Resultant mild left with moderate right L4 foraminal stenosis. Spinal canal remains widely patent.   L5-S1: Normal interspace. Mild facet hypertrophy. Mild epidural lipomatosis. No canal or foraminal stenosis.   IMPRESSION: 1. Chronic bilateral pars defects at L4 with associated 4 mm spondylolisthesis. Associated right foraminal disc protrusion at L4-5, impinging upon the transiting right L4 nerve root in the right neural foramen. 2. Mild to moderate facet hypertrophy at L3-4 through L5-S1.     Electronically Signed   By: Rise Mu M.D.   On: 06/10/2020 23:01   He reports that he has been smoking cigarettes. He has a 30.00 pack-year smoking history. He has never used smokeless tobacco. No results for input(s): HGBA1C, LABURIC in the last 8760 hours.  Objective:  VS:  HT:     WT:    BMI:      BP:(!) 144/94   HR:85bpm   TEMP: ( )   RESP:  Physical Exam Vitals and nursing note reviewed.  HENT:     Head: Normocephalic and atraumatic.     Right Ear: External ear normal.     Left Ear: External ear normal.     Nose: Nose normal.     Mouth/Throat:     Mouth: Mucous membranes are moist.  Eyes:  Extraocular Movements: Extraocular movements intact.  Cardiovascular:     Rate and Rhythm: Normal rate.     Pulses: Normal pulses.  Pulmonary:     Effort: Pulmonary effort is normal.  Abdominal:     General: Abdomen is flat. There is no distension.  Musculoskeletal:        General: Tenderness present.     Cervical back: Normal range of motion.     Comments: Pt rises from seated position to standing without difficulty. Good lumbar range of motion. Strong distal strength without clonus, no pain upon palpation of greater trochanters. Sensation intact bilaterally. Dysesthesias noted to right L4/L5 dermatomes. Walks independently, gait steady.   Limited range of motion  to right shoulder. Strong bilateral upper extremity strength, strong bilateral grip strengths.   Skin:    General: Skin is warm and dry.     Capillary Refill: Capillary refill takes less than 2 seconds.  Neurological:     General: No focal deficit present.     Mental Status: He is alert and oriented to person, place, and time.  Psychiatric:        Mood and Affect: Mood normal.    Ortho Exam  Imaging: No results found.  Past Medical/Family/Surgical/Social History: Medications & Allergies reviewed per EMR, new medications updated. 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   Past Medical History:  Diagnosis Date   Anxiety    Family History  Problem Relation Age of Onset   Breast cancer Mother    Stroke Brother    Throat cancer Brother    Past Surgical History:  Procedure Laterality Date   EXCISION MASS NECK Left 05/18/2018   Procedure: EXCISION LEFT LEVEL TWO NECK MASS;  Surgeon: Flo Shanks, MD;  Location: [facility] OR;  Service: ENT;  Laterality: Left;   TONSILLECTOMY  05/03/2012   Procedure: TONSILLECTOMY;  Surgeon: Serena Colonel, MD;  Location:  SUR[facility]ERY [facility];  Service: ENT;  Laterality: N/A;   Social History   Occupational History   Not on file  Tobacco Use   Smoking status: Every Day    Packs/day: 1.00    Years: 30.00    Pack years: 30.00    Types: Cigarettes   Smokeless tobacco: Never  Vaping Use   Vaping Use: Never used  Substance and Sexual Activity   Alcohol use: Not Currently    Comment: drinks very rare   Drug use: Not Currently    Types: Marijuana, Cocaine    Comment: nothing recently   Sexual activity: Not on file     Comment: smokes  1 pack a day

## 2021-09-03 NOTE — Patient Instructions (Signed)

## 2021-09-03 NOTE — Progress Notes (Signed)
Pt state lower back pain that travels to his right hip. Pt state walking, standing and sitting. Pt state he uses excises and he takes pain meds to help ease his pain. Pt state he has pain in his left shoulder that travels numbness down to his elbow and hand.   Numeric Pain Rating Scale and Functional Assessment Average Pain 10 Pain Right Now 4 My pain is intermittent, sharp, stabbing, tingling, and aching Pain is worse with: walking, sitting, standing, and some activites Pain improves with: therapy/exercise and medication   In the last MONTH (on 0-10 scale) has pain interfered with the following?  1. General activity like being  able to carry out your everyday physical activities such as walking, climbing stairs, carrying groceries, or moving a chair?  Rating(7)  2. Relation with others like being able to carry out your usual social activities and roles such as  activities at home, at work and in your community. Rating(8)  3. Enjoyment of life such that you have  been bothered by emotional problems such as feeling anxious, depressed or irritable?  Rating(9)

## 2021-09-12 ENCOUNTER — Ambulatory Visit (INDEPENDENT_AMBULATORY_CARE_PROVIDER_SITE_OTHER): Payer: Medicaid Other

## 2021-09-12 ENCOUNTER — Ambulatory Visit (INDEPENDENT_AMBULATORY_CARE_PROVIDER_SITE_OTHER): Payer: Medicaid Other | Admitting: Orthopedic Surgery

## 2021-09-12 DIAGNOSIS — G8929 Other chronic pain: Secondary | ICD-10-CM

## 2021-09-12 DIAGNOSIS — M25512 Pain in left shoulder: Secondary | ICD-10-CM | POA: Diagnosis not present

## 2021-09-17 ENCOUNTER — Encounter: Payer: Self-pay | Admitting: Orthopedic Surgery

## 2021-09-17 ENCOUNTER — Ambulatory Visit
Admission: RE | Admit: 2021-09-17 | Discharge: 2021-09-17 | Disposition: A | Discharge status: Home / Self Care | Payer: Medicaid Other | Source: Ambulatory Visit | Attending: Nurse Practitioner | Admitting: Nurse Practitioner

## 2021-09-17 ENCOUNTER — Other Ambulatory Visit: Payer: Self-pay

## 2021-09-17 DIAGNOSIS — Z87891 Personal history of nicotine dependence: Secondary | ICD-10-CM
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
## 2021-09-17 NOTE — Progress Notes (Signed)
/  Benign appearing CT chest for lung cancer screening. Does have hepatic steatosis. Will discuss findings with patient at visit 09/23/2021. Repeat CT in one year.

## 2021-09-25 ENCOUNTER — Other Ambulatory Visit: Payer: Self-pay

## 2021-09-25 DIAGNOSIS — Z0000 Encounter for general adult medical examination without abnormal findings: Secondary | ICD-10-CM

## 2021-09-25 DIAGNOSIS — Z125 Encounter for screening for malignant neoplasm of prostate: Secondary | ICD-10-CM

## 2021-09-26 ENCOUNTER — Other Ambulatory Visit: Payer: Medicaid Other

## 2021-09-26 ENCOUNTER — Other Ambulatory Visit: Payer: Self-pay

## 2021-09-26 DIAGNOSIS — Z0000 Encounter for general adult medical examination without abnormal findings: Secondary | ICD-10-CM

## 2021-09-26 DIAGNOSIS — Z125 Encounter for screening for malignant neoplasm of prostate: Secondary | ICD-10-CM

## 2021-09-27 LAB — CBC
Hematocrit: 51 % (ref 37.5–51.0)
Hemoglobin: 17.3 g/dL (ref 13.0–17.7)
MCH: 28.3 pg (ref 26.6–33.0)
MCHC: 33.9 g/dL (ref 31.5–35.7)
MCV: 83 fL (ref 79–97)
Platelets: 287 10*3/uL (ref 150–450)
RBC: 6.12 x10E6/uL — ABNORMAL HIGH (ref 4.14–5.80)
RDW: 13.3 % (ref 11.6–15.4)
WBC: 8 10*3/uL (ref 3.4–10.8)

## 2021-09-27 LAB — COMPREHENSIVE METABOLIC PANEL
ALT: 37 IU/L (ref 0–44)
AST: 25 IU/L (ref 0–40)
Albumin/Globulin Ratio: 1.4 (ref 1.2–2.2)
Albumin: 4.3 g/dL (ref 3.8–4.9)
Alkaline Phosphatase: 74 IU/L (ref 44–121)
BUN/Creatinine Ratio: 14 (ref 9–20)
BUN: 18 mg/dL (ref 6–24)
Bilirubin Total: 0.4 mg/dL (ref 0.0–1.2)
CO2: 25 mmol/L (ref 20–29)
Calcium: 9.6 mg/dL (ref 8.7–10.2)
Chloride: 101 mmol/L (ref 96–106)
Creatinine, Ser: 1.25 mg/dL (ref 0.76–1.27)
Globulin, Total: 3.1 g/dL (ref 1.5–4.5)
Glucose: 124 mg/dL — ABNORMAL HIGH (ref 70–99)
Potassium: 5.5 mmol/L — ABNORMAL HIGH (ref 3.5–5.2)
Sodium: 139 mmol/L (ref 134–144)
Total Protein: 7.4 g/dL (ref 6.0–8.5)
eGFR: 68 mL/min/{1.73_m2} (ref >59)

## 2021-09-27 LAB — LIPID PANEL
Chol/HDL Ratio: 6 ratio — ABNORMAL HIGH (ref 0.0–5.0)
Cholesterol, Total: 221 mg/dL — ABNORMAL HIGH (ref 100–199)
HDL: 37 mg/dL — ABNORMAL LOW (ref >39)
LDL Chol Calc (NIH): 143 mg/dL — ABNORMAL HIGH (ref 0–99)
Triglycerides: 225 mg/dL — ABNORMAL HIGH (ref 0–149)
VLDL Cholesterol Cal: 41 mg/dL — ABNORMAL HIGH (ref 5–40)

## 2021-09-27 LAB — PSA: Prostate Specific Ag, Serum: 1.1 ng/mL (ref 0.0–4.0)

## 2021-09-27 LAB — TSH: TSH: 2.97 u[IU]/mL (ref 0.450–4.500)

## 2021-09-27 LAB — HEMOGLOBIN A1C
Est. average glucose Bld gHb Est-mCnc: 131 mg/dL
Hgb A1c MFr Bld: 6.2 % — ABNORMAL HIGH (ref 4.8–5.6)

## 2021-09-27 NOTE — Progress Notes (Signed)
Elevated cholesterol and glucose. Will discuss with patient during visit 10/03/2021

## 2021-10-03 ENCOUNTER — Ambulatory Visit (INDEPENDENT_AMBULATORY_CARE_PROVIDER_SITE_OTHER): Payer: Medicaid Other | Admitting: Nurse Practitioner

## 2021-10-03 ENCOUNTER — Other Ambulatory Visit: Payer: Self-pay

## 2021-10-03 ENCOUNTER — Encounter: Payer: Self-pay | Admitting: Nurse Practitioner

## 2021-10-03 VITALS — BP 124/85 | HR 89 | Temp 97.9°F | Ht 72.0 in | Wt 208.5 lb

## 2021-10-03 DIAGNOSIS — E782 Mixed hyperlipidemia: Secondary | ICD-10-CM

## 2021-10-03 DIAGNOSIS — Z6828 Body mass index (BMI) 28.0-28.9, adult: Secondary | ICD-10-CM

## 2021-10-03 DIAGNOSIS — Z0001 Encounter for general adult medical examination with abnormal findings: Secondary | ICD-10-CM

## 2021-10-03 DIAGNOSIS — E876 Hypokalemia: Secondary | ICD-10-CM | POA: Diagnosis not present

## 2021-10-03 DIAGNOSIS — F17209 Nicotine dependence, unspecified, with unspecified nicotine-induced disorders: Secondary | ICD-10-CM | POA: Diagnosis not present

## 2021-10-03 MED ORDER — BUPROPION HCL ER (SR) 150 MG PO TB12: 150.0000 mg | ORAL_TABLET | Freq: Two times a day (BID) | ORAL | 2 refills | Status: DC

## 2021-10-03 NOTE — Progress Notes (Signed)
Established Patient Office Visit  Subjective:  Patient ID: Levi Johnston, male    DOB: March 27, 1966  Age: 56 y.o. MRN: [id number]  CC:  Chief Complaint  Patient presents with   Annual Exam    HPI [name] presents for annual wellness visit. He had routine, fasting labs done prior to this visit.  His lipid patient was generally elevated.   Lipid Panel     Component Value Date/Time   CHOL 221 (H) 09/26/2021 0820   TRIG 225 (H) 09/26/2021 0820   HDL 37 (L) 09/26/2021 0820   CHOLHDL 6.0 (H) 09/26/2021 0820   LDLCALC 143 (H) 09/26/2021 0820   LABVLDL 41 (H) 09/26/2021 0820    The patient states that he has not been doing very well with good dietary choices. States that two years, he really wasn't motivated to take good care of himself. He states that his mother and brother passed away unexpectiedly two yeats ago, and the patient feels that this caused him to feel very down and unmotivated to take good care of himself. The patient states that he is ready to start making better diet choices and begin exercising again.  The patient's blood sugar was slightly elevated and HgbA1c was 6.2.  Of note, his potassium level was 5.5. will recheck that during today's visit.  The patient is a current, everyday smoker. He is waxing and waning between getting ready to quit. He would like to discuss options to help him quit and to consider all options before committing to quitting.  He has no physical concerns or complaints.   Past Medical History:  Diagnosis Date   Anxiety     Past Surgical History:  Procedure Laterality Date   EXCISION MASS NECK Left 05/18/2018   Procedure: EXCISION LEFT LEVEL TWO NECK MASS;  Surgeon: Jodi Marble, MD;  Location: [place];  Service: ENT;  Laterality: Left;   TONSILLECTOMY  05/03/2012   Procedure: TONSILLECTOMY;  Surgeon: Izora Gala, MD;  Location: [facility];  Service: ENT;  Laterality: N/A;    Family History  Problem Relation Age of Onset    Breast cancer Mother    Stroke Brother    Throat cancer Brother     Social History   Socioeconomic History   Marital status: Divorced    Spouse name: Not on file   Number of children: Not on file   Years of education: Not on file   Highest education level: Not on file  Occupational History   Not on file  Tobacco Use   Smoking status: Every Day    Packs/day: 1.00    Years: 30.00    Pack years: 30.00    Types: Cigarettes   Smokeless tobacco: Never  Vaping Use   Vaping Use: Never used  Substance and Sexual Activity   Alcohol use: Not Currently    Comment: drinks very rare   Drug use: Not Currently    Types: Marijuana, Cocaine    Comment: nothing recently   Sexual activity: Not on file    Comment: smokes  1 pack a day  Other Topics Concern   Not on file  Social History Narrative   Not on file   Social Determinants of Health   Financial Resource Strain: Not on file  Food Insecurity: Not on file  Transportation Needs: Not on file  Physical Activity: Not on file  Stress: Not on file  Social Connections: Not on file  Intimate Partner Violence: Not on file  Outpatient Medications Prior to Visit  Medication Sig Dispense Refill   diclofenac Sodium (VOLTAREN) 1 % GEL Apply 2 g topically 4 (four) times daily. 50 g 0   HYDROcodone-acetaminophen (NORCO/VICODIN) 5-325 MG tablet Take 1-2 tablets by mouth every 4 (four) hours as needed for moderate pain. 30 tablet 0   predniSONE (DELTASONE) 50 MG tablet Take one tablet by mouth once daily for 5 days. 5 tablet 0   No facility-administered medications prior to visit.    Allergies  Allergen Reactions   Penicillins Other (See Comments)    Unknown childhood reaction Has patient had a PCN reaction causing immediate rash, facial/tongue/throat swelling, SOB or lightheadedness with hypotension: Unknown Has patient had a PCN reaction causing severe rash involving mucus membranes or skin necrosis: Unknown Has patient had a PCN  reaction that required hospitalization: Unknown Has patient had a PCN reaction occurring within the last 10 years: Unknown If all of the above answers are "NO", then may proceed with Cephalosporin use.    ROS Review of Systems  Constitutional:  Negative for activity change, chills, fatigue and fever.  HENT:  Negative for congestion, postnasal drip, rhinorrhea, sinus pressure, sinus pain, sneezing and sore throat.   Eyes: Negative.   Respiratory:  Negative for cough, shortness of breath and wheezing.   Cardiovascular:  Negative for chest pain and palpitations.  Gastrointestinal:  Negative for constipation, diarrhea, nausea and vomiting.  Endocrine: Negative for cold intolerance, heat intolerance, polydipsia and polyuria.  Genitourinary:  Negative for dysuria, frequency and urgency.  Musculoskeletal:  Negative for back pain and myalgias.  Skin:  Negative for rash.  Allergic/Immunologic: Negative for environmental allergies.  Neurological:  Negative for dizziness, weakness and headaches.  Psychiatric/Behavioral:  The patient is not nervous/anxious.      Objective:    Physical Exam Vitals and nursing note reviewed.  Constitutional:      Appearance: Normal appearance. He is well-developed.  HENT:     Head: Normocephalic and atraumatic.     Nose: Nose normal.     Mouth/Throat:     Mouth: Mucous membranes are moist.  Eyes:     Extraocular Movements: Extraocular movements intact.     Conjunctiva/sclera: Conjunctivae normal.     Pupils: Pupils are equal, round, and reactive to light.  Cardiovascular:     Rate and Rhythm: Normal rate and regular rhythm.     Pulses: Normal pulses.     Heart sounds: Normal heart sounds.  Pulmonary:     Effort: Pulmonary effort is normal.     Breath sounds: Normal breath sounds.  Abdominal:     Palpations: Abdomen is soft.  Musculoskeletal:        General: Normal range of motion.     Cervical back: Normal range of motion and neck supple.   Lymphadenopathy:     Cervical: No cervical adenopathy.  Skin:    General: Skin is warm and dry.     Capillary Refill: Capillary refill takes less than 2 seconds.  Neurological:     General: No focal deficit present.     Mental Status: He is alert and oriented to person, place, and time.  Psychiatric:        Mood and Affect: Mood normal.        Behavior: Behavior normal.        Thought Content: Thought content normal.        Judgment: Judgment normal.   Today's Vitals   10/03/21 0847  BP: 124/85  Pulse: 89  Temp: 97.9 F (36.6 C)  SpO2: 98%  Weight: 208 lb 8 oz (94.6 kg)  Height: 6' (1.829 m)   Body mass index is 28.28 kg/m.   Wt Readings from Last 3 Encounters:  10/03/21 208 lb 8 oz (94.6 kg)  08/20/21 201 lb 14.4 oz (91.6 kg)  05/22/20 213 lb (96.6 kg)     Health Maintenance Due  Topic Date Due   COVID-19 Vaccine (1) Never done    There are no preventive care reminders to display for this patient.  Lab Results  Component Value Date   TSH 2.970 09/26/2021   Lab Results  Component Value Date   WBC 8.0 09/26/2021   HGB 17.3 09/26/2021   HCT 51.0 09/26/2021   MCV 83 09/26/2021   PLT 287 09/26/2021   Lab Results  Component Value Date   NA 142 10/03/2021   K 4.1 10/03/2021   CO2 25 10/03/2021   GLUCOSE 103 (H) 10/03/2021   BUN 19 10/03/2021   CREATININE 1.16 10/03/2021   BILITOT 0.4 09/26/2021   ALKPHOS 74 09/26/2021   AST 25 09/26/2021   ALT 37 09/26/2021   PROT 7.4 09/26/2021   ALBUMIN 4.3 09/26/2021   CALCIUM 10.3 (H) 10/03/2021   ANIONGAP 9 12/05/2019   EGFR 75 10/03/2021   Lab Results  Component Value Date   CHOL 221 (H) 09/26/2021   Lab Results  Component Value Date   HDL 37 (L) 09/26/2021   Lab Results  Component Value Date   LDLCALC 143 (H) 09/26/2021   Lab Results  Component Value Date   TRIG 225 (H) 09/26/2021   Lab Results  Component Value Date   CHOLHDL 6.0 (H) 09/26/2021   Lab Results  Component Value Date   HGBA1C  6.2 (H) 09/26/2021      Assessment & Plan:   Problem List Items Addressed This Visit       Other   Body mass index 28.0-28.9, adult    Encourage patient to limit calorie intake to 2000 cal/day or less.  He should consume a low cholesterol, low-fat diet.  Patient shouldincorporate exercise into his daily routine.      Hypokalemia    Recheck BMP during today's visit.       Relevant Orders   Basic Metabolic Panel (BMET) (Completed)   Tobacco use disorder, continuous    tria Bupropion XR 117m twice daily. Advised him to start with taking 1 tablet in the mornings. May increase to twice daily dosing after one week of tolerated well.       Relevant Medications   buPROPion (WELLBUTRIN SR) 150 MG 12 hr tablet   Encounter for general adult medical examination with abnormal findings - Primary   Mixed hyperlipidemia    Recommend patient limit intake of fried and fatty foods. He should increase intake of lean proteins and green leafy vegetables. Adding exercise into daily routine will also be beneficial.        Meds ordered this encounter  Medications   buPROPion (WELLBUTRIN SR) 150 MG 12 hr tablet    Sig: Take 1 tablet (150 mg total) by mouth 2 (two) times daily.    Dispense:  60 tablet    Refill:  2    Order Specific Question:   Supervising Provider    Answer:   MBeatrice LecherD [[id number]]    Follow-up: Return in about 3 months (around 01/01/2022) for hld, FBW a week prior to visit.    HRonnell Freshwater NP

## 2021-10-04 LAB — BASIC METABOLIC PANEL
BUN/Creatinine Ratio: 16 (ref 9–20)
BUN: 19 mg/dL (ref 6–24)
CO2: 25 mmol/L (ref 20–29)
Calcium: 10.3 mg/dL — ABNORMAL HIGH (ref 8.7–10.2)
Chloride: 101 mmol/L (ref 96–106)
Creatinine, Ser: 1.16 mg/dL (ref 0.76–1.27)
Glucose: 103 mg/dL — ABNORMAL HIGH (ref 70–99)
Potassium: 4.1 mmol/L (ref 3.5–5.2)
Sodium: 142 mmol/L (ref 134–144)
eGFR: 75 mL/min/{1.73_m2} (ref >59)

## 2021-10-10 ENCOUNTER — Encounter: Payer: Self-pay | Admitting: Nurse Practitioner

## 2021-10-10 NOTE — Progress Notes (Signed)
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

## 2021-10-10 NOTE — Progress Notes (Signed)
Normal potassium. Elevated calcium. Recheck next visit. MyChart message sent to patient.

## 2021-10-13 DIAGNOSIS — Z0001 Encounter for general adult medical examination with abnormal findings: Secondary | ICD-10-CM | POA: Insufficient documentation

## 2021-10-13 DIAGNOSIS — F17209 Nicotine dependence, unspecified, with unspecified nicotine-induced disorders: Secondary | ICD-10-CM | POA: Insufficient documentation

## 2021-10-13 DIAGNOSIS — E876 Hypokalemia: Secondary | ICD-10-CM | POA: Insufficient documentation

## 2021-10-13 DIAGNOSIS — E782 Mixed hyperlipidemia: Secondary | ICD-10-CM | POA: Insufficient documentation

## 2021-10-13 NOTE — Assessment & Plan Note (Signed)
Encourage patient to limit calorie intake to 2000 cal/day or less.  He should consume a low cholesterol, low-fat diet.  Patient should incorporate exercise into his daily routine. °

## 2021-10-13 NOTE — Assessment & Plan Note (Signed)
tria Bupropion XR 150mg  twice daily. Advised him to start with taking 1 tablet in the mornings. May increase to twice daily dosing after one week of tolerated well.

## 2021-10-13 NOTE — Assessment & Plan Note (Signed)
Recheck BMP during today's visit.

## 2021-10-13 NOTE — Assessment & Plan Note (Signed)
Recommend patient limit intake of fried and fatty foods. He should increase intake of lean proteins and green leafy vegetables. Adding exercise into daily routine will also be beneficial.

## 2021-10-15 ENCOUNTER — Ambulatory Visit (INDEPENDENT_AMBULATORY_CARE_PROVIDER_SITE_OTHER): Payer: Medicaid Other | Admitting: Orthopedic Surgery

## 2021-10-15 ENCOUNTER — Encounter: Payer: Self-pay | Admitting: Orthopedic Surgery

## 2021-10-15 ENCOUNTER — Other Ambulatory Visit: Payer: Self-pay

## 2021-10-15 DIAGNOSIS — M75122 Complete rotator cuff tear or rupture of left shoulder, not specified as traumatic: Secondary | ICD-10-CM

## 2021-10-15 NOTE — Progress Notes (Signed)
Office Visit Note   Patient: Levi Johnston           Date of Birth: 1965/11/17           MRN: [id number] Visit Date: 10/15/2021              Requested by: Ronnell Freshwater, NP [place] [place],  [place] [address] PCP: Ronnell Freshwater, NP  Chief Complaint  Patient presents with   Left Shoulder - Pain, Follow-up      HPI: Patient is a 56 year old gentleman who still has decreased range of motion and increased pain with activities daily living with his left shoulder.  Patient has been doing Thera-Band internal and external rotation strengthening as well as scapular stabilization strengthening he states he has noticed a small improvement but not much.  Past medical history positive for tobacco.  Patient feels like the numbness going down to his fingers has slightly improved.  Assessment & Plan: Visit Diagnoses:  1. Nontraumatic complete tear of left rotator cuff     Plan: With the persistent rotator cuff symptoms we will plan to obtain an MRI scan to see if arthroscopic intervention is necessary for improvement.  Follow-Up Instructions: Return in about 2 weeks (around 10/29/2021).   Ortho Exam  Patient is alert, oriented, no adenopathy, well-dressed, normal affect, normal respiratory effort. Examination patient has abduction and flexion of the left shoulder to 90 degrees.  He has pain with Neer and Hawkins impingement test decreased range of motion with internal and external rotation.  The biceps tendon is nontender to palpation.  Imaging: No results found. No images are attached to the encounter.  Labs: Lab Results  Component Value Date   HGBA1C 6.2 (H) 09/26/2021     Lab Results  Component Value Date   ALBUMIN 4.3 09/26/2021   ALBUMIN 3.9 12/05/2019    No results found for: MG No results found for: VD25OH  No results found for: PREALBUMIN CBC EXTENDED Latest Ref Rng & Units 09/26/2021 12/05/2019 05/14/2018  WBC 3.4 - 10.8 x10E3/uL 8.0 8.6 8.8  RBC  4.14 - 5.80 x10E6/uL 6.12(H) 5.82(H) 5.73  HGB 13.0 - 17.7 g/dL 17.3 17.1(H) 16.9  HCT 37.5 - 51.0 % 51.0 51.9 51.6  PLT 150 - 450 x10E3/uL 287 310 256     There is no height or weight on file to calculate BMI.  Orders:  No orders of the defined types were placed in this encounter.  No orders of the defined types were placed in this encounter.    Procedures: No procedures performed  Clinical Data: No additional findings.  ROS:  All other systems negative, except as noted in the HPI. Review of Systems  Objective: Vital Signs: There were no vitals taken for this visit.  Specialty Comments:  No specialty comments available.  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   Past Medical History:  Diagnosis Date   Anxiety     Family History  Problem Relation Age of Onset   Breast cancer Mother    Stroke Brother    Throat cancer Brother     Past Surgical History:  Procedure Laterality Date   EXCISION MASS NECK Left 05/18/2018   Procedure: EXCISION LEFT LEVEL TWO NECK MASS;  Surgeon: Jodi Marble, MD;  Location: [facility];  Service: ENT;  Laterality: Left;   TONSILLECTOMY  05/03/2012   Procedure: TONSILLECTOMY;  Surgeon: Izora Gala, MD;  Location: [facility];  Service: ENT;  Laterality: N/A;   Social History   Occupational History   Not on file  Tobacco Use   Smoking status: Every Day    Packs/day: 1.00    Years: 30.00     Pack years: 30.00    Types: Cigarettes   Smokeless tobacco: Never  Vaping Use   Vaping Use: Never used  Substance and Sexual Activity   Alcohol use: Not Currently    Comment: drinks very rare   Drug use: Not Currently    Types: Marijuana, Cocaine    Comment: nothing recently   Sexual activity: Not on file    Comment: smokes  1 pack a day

## 2021-10-28 ENCOUNTER — Other Ambulatory Visit: Payer: Self-pay

## 2021-10-28 ENCOUNTER — Ambulatory Visit
Admission: RE | Admit: 2021-10-28 | Discharge: 2021-10-28 | Disposition: A | Discharge status: Home / Self Care | Payer: Medicaid Other | Source: Ambulatory Visit | Attending: Orthopedic Surgery | Admitting: Orthopedic Surgery

## 2021-10-28 DIAGNOSIS — M19012 Primary osteoarthritis, left shoulder: Secondary | ICD-10-CM | POA: Diagnosis not present

## 2021-10-28 DIAGNOSIS — M25412 Effusion, left shoulder: Secondary | ICD-10-CM | POA: Diagnosis not present

## 2021-10-28 DIAGNOSIS — M75122 Complete rotator cuff tear or rupture of left shoulder, not specified as traumatic: Secondary | ICD-10-CM

## 2021-10-28 DIAGNOSIS — M65812 Other synovitis and tenosynovitis, left shoulder: Secondary | ICD-10-CM | POA: Diagnosis not present

## 2022-12-22 IMAGING — MR MR SHOULDER*L* W/O CM
5 series · 40 of 40 positions shown · non-contrast
Comparison: X-ray 09/12/2021

CLINICAL DATA: Left shoulder pain and limited range of motion

EXAM:
MRI OF THE LEFT SHOULDER WITHOUT CONTRAST
TECHNIQUE: Multiplanar, multisequence MR imaging of the shoulder was performed.
No intravenous contrast was administered.

[Series 3: T2 fat-sat · axial · 4.0mm · 0.27mm/px · z∈[-70,+74]mm · 10 of 29 slices shown (1 of 3)]
[im 1/29]
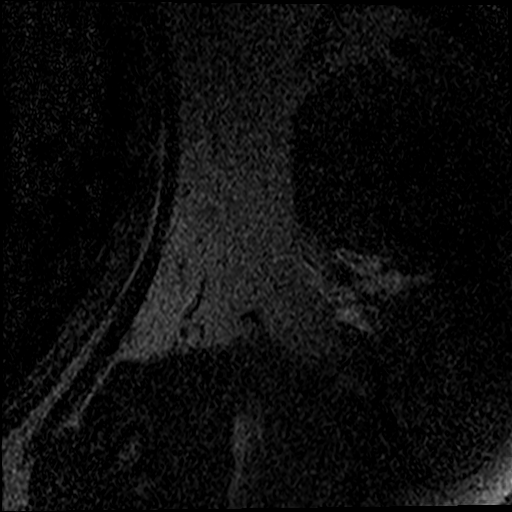
[im 4/29]
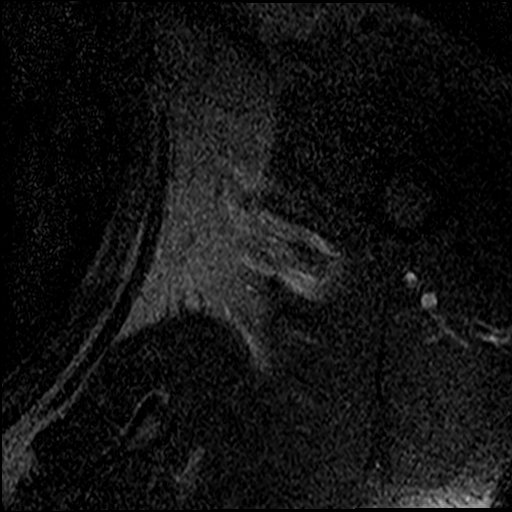
[im 7/29]
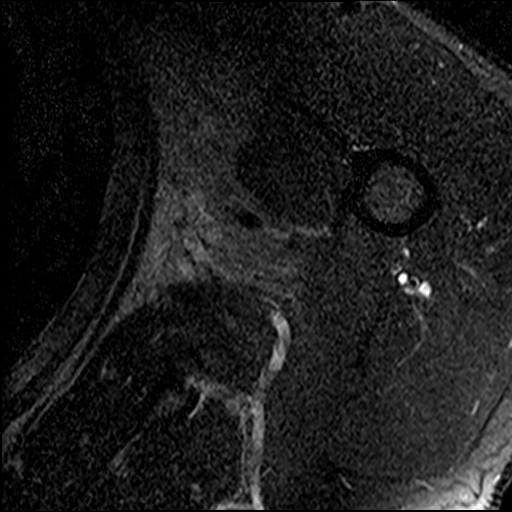
[im 10/29]
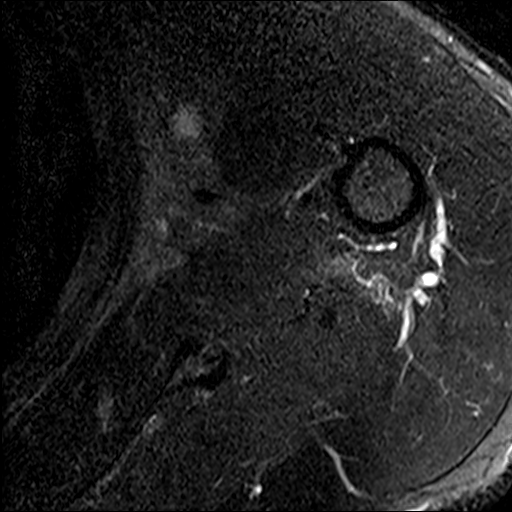
[im 13/29]
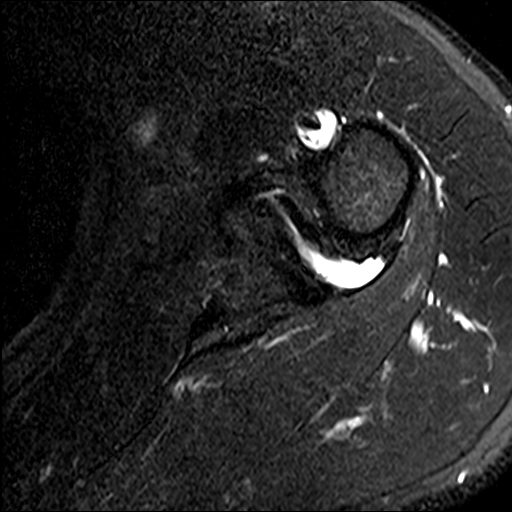
[im 16/29]
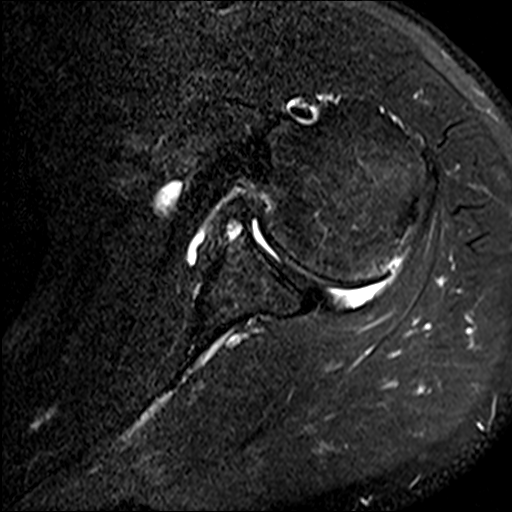
[im 19/29]
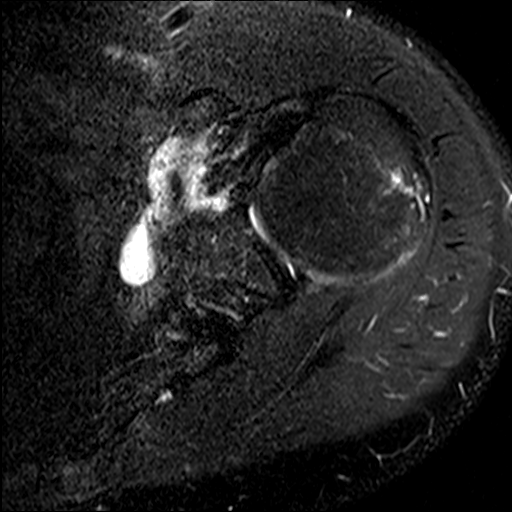
[im 22/29]
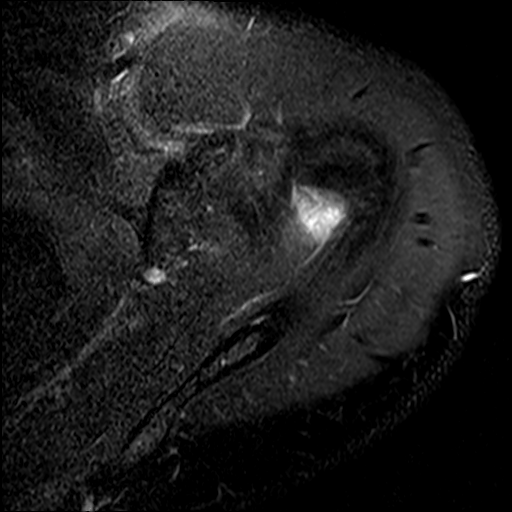
[im 25/29]
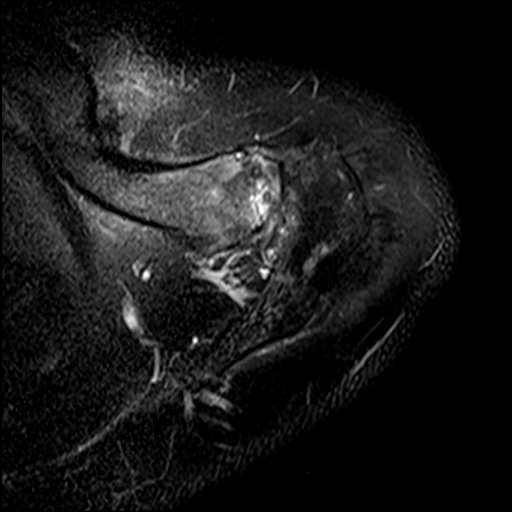
[im 29/29]
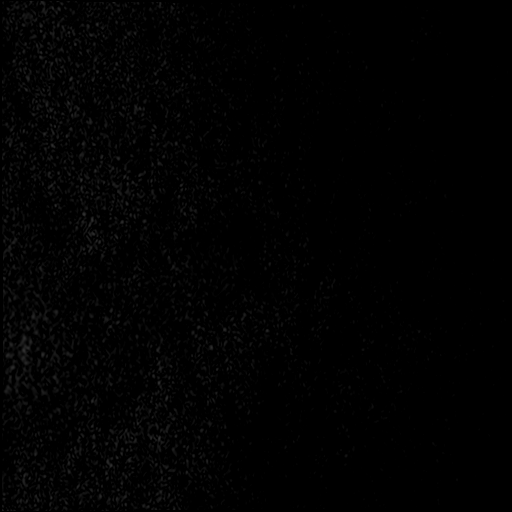

[Series 4: T2 fat-sat · oblique · 4.0mm · 0.59mm/px · 8 of 21 slices shown (2 of 3)]
[im 1/21]
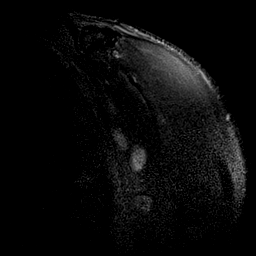
[im 3/21]
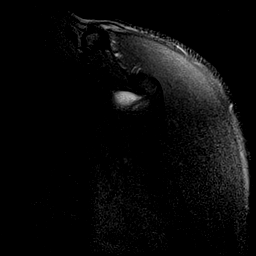
[im 6/21]
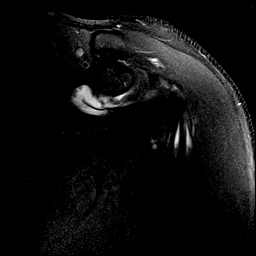
[im 9/21]
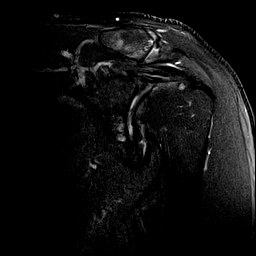
[im 12/21]
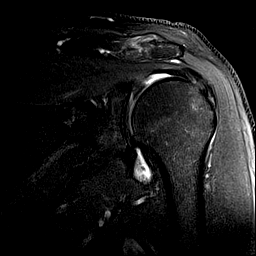
[im 15/21]
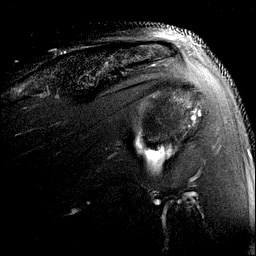
[im 18/21]
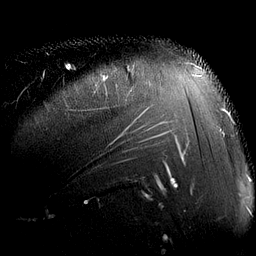
[im 21/21]
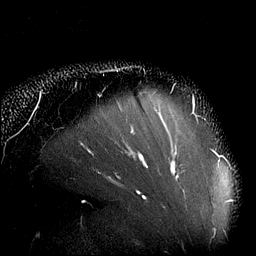

[Series 5: PD · oblique · 4.0mm · 0.59mm/px · 8 of 21 slices shown]
[im 1/21]
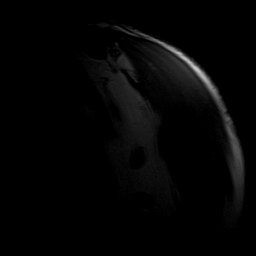
[im 3/21]
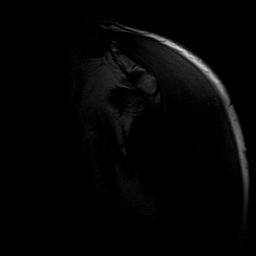
[im 6/21]
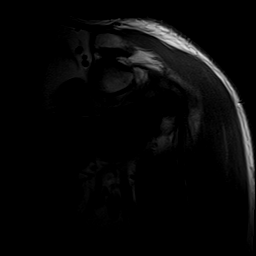
[im 9/21]
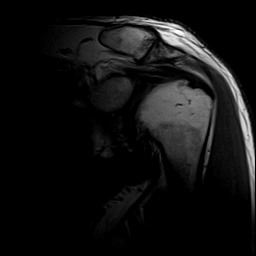
[im 12/21]
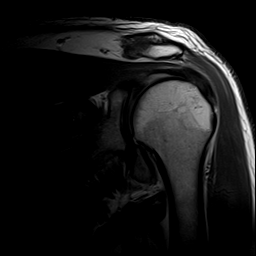
[im 15/21]
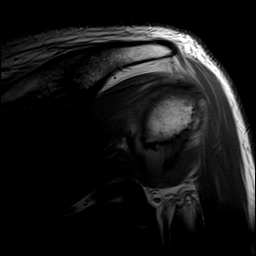
[im 18/21]
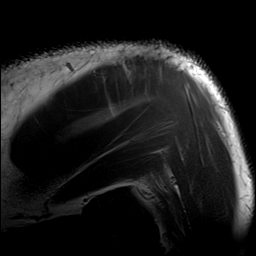
[im 21/21]
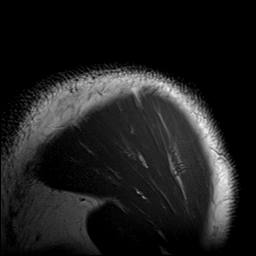

[Series 6: T2 fat-sat · oblique · 4.0mm · 0.59mm/px · 7 of 20 slices shown (3 of 3)]
[im 1/20]
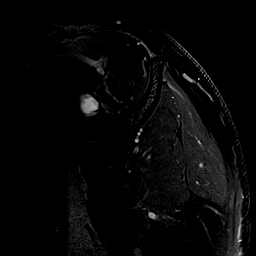
[im 4/20]
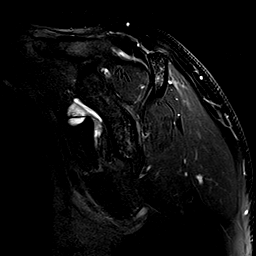
[im 7/20]
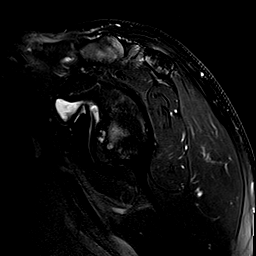
[im 10/20]
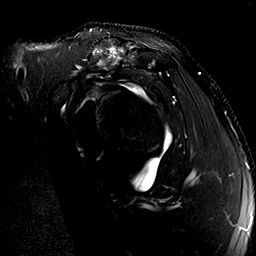
[im 13/20]
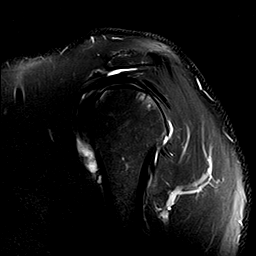
[im 16/20]
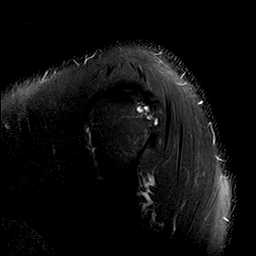
[im 20/20]
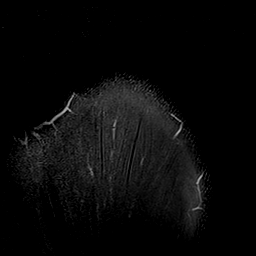

[Series 7: T1 · oblique · 4.0mm · 0.59mm/px · 7 of 20 slices shown]
[im 1/20]
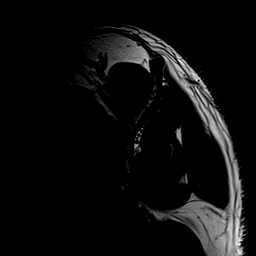
[im 4/20]
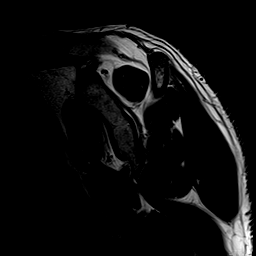
[im 7/20]
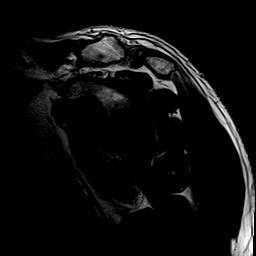
[im 10/20]
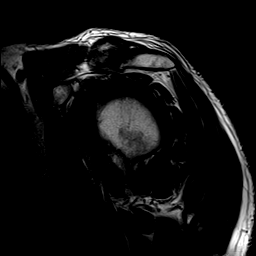
[im 13/20]
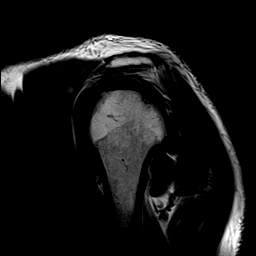
[im 16/20]
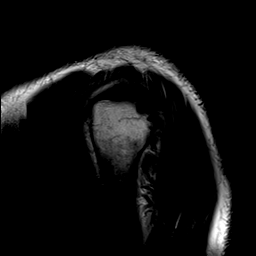
[im 20/20]
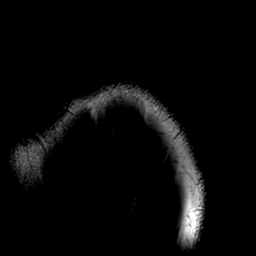

[40 of 40 positions shown; findings below may reference images not displayed]

FINDINGS: Rotator cuff: Mild-moderate rotator cuff tendinosis. No discrete
tear.

Muscles: Preserved bulk and signal intensity of the rotator cuff
musculature without edema, atrophy, or fatty infiltration.

Biceps long head: Mild intra-articular biceps tendinosis. Small
volume tenosynovial fluid with loose body or debris in the tendon
sheath.

Acromioclavicular Joint: Moderate degenerative changes of the AC
joint. Trace subacromial-subdeltoid bursal fluid.

Glenohumeral Joint: Moderate joint effusion containing
intra-articular loose bodies within the axillary pouch. Additional
1.1 cm calcified loose body within the subscapularis joint recess.
High-grade cartilage loss along the anterior inferior glenoid with
underlying subchondral cyst formation.

Labrum:  Labrum appears degenerated.  No paralabral cyst.

Bones: Glenohumeral joint space narrowing with marginal osteophyte
formation. Reactive subcortical cystic changes at the greater
tuberosity. No fracture or dislocation. No suspicious bone lesion.

Other: None.
IMPRESSION: 1. Mild-moderate rotator cuff tendinosis. No rotator cuff tear.
2. Mild intra-articular biceps tendinosis with tenosynovitis.
3. Moderate acromioclavicular and glenohumeral osteoarthritis.
4. Moderate glenohumeral joint effusion containing intra-articular
loose bodies.

## 2024-03-28 ENCOUNTER — Ambulatory Visit: Payer: Self-pay

## 2024-03-28 ENCOUNTER — Ambulatory Visit: Payer: Self-pay | Admitting: *Deleted

## 2024-03-28 NOTE — Telephone Encounter (Signed)
 FYI Only or Action Required?: Action required by provider: request for appointment.  Patient was last seen in primary care on 10/03/2021 by Hanford Powell BRAVO, NP.  Called Nurse Triage reporting Palpitations.  Symptoms began today.  Interventions attempted: Nothing.  Symptoms are: gradually worsening.  Triage Disposition: No disposition on file.  See triage notes.   Daughter, Levi Johnston calling in wanting an appt for pt due to fast heart rate and fatigue.   He was not with her and she is not on the [facility].   She is going to call and have him call in to speak with a triage nurse however Levi Johnston said he probably will not call which is why she was calling.     I'm not sure which practice Levi Johnston called into.   There is communication with [facility] noted in his chart but a visit is not noted so not sure he is established there.    He is established at Primary Care at [facility].   LOV 10/03/2021 with Powell Hanford, NP who is no longer with the practice. I called pt and left a voice mail that his daughter had called in for an appt but a nurse needed to talk with him and get him scheduled.   Message left for him to call Primary Care at [facility] back and the number.   I have forwarded a message to Primary Care at [facility] as an [place].       Patient/caregiver understands and will follow disposition?: Copied from CRM [id number]. Topic: Clinical - Red Word Triage >> Mar 28, 2024  8:13 AM Avram MATSU wrote: Red Word that prompted transfer to Nurse Triage: extreme fatigue and racing heart   ----------------------------------------------------------------------- From previous Reason for Contact - Scheduling: Patient/patient representative is calling to schedule an appointment. Refer to attachments for appointment information. Answer Assessment - Initial Assessment Questions 1. DESCRIPTION: Please describe your heart rate or heartbeat that you are having (e.g., fast/slow,  regular/irregular, skipped or extra beats, palpitations)     Levi Johnston, daughter Not on DPR calling in.     He is going to lie and will not make an appt.  He will not tell the truth.   I know everything the doctors have told him for the last 6 years.   I also have access to his MyChart.    Can I just get him scheduled?  He won't call in and make the appt.   I had let her know since she is not listed on his DPR that I wasn't able to discuss his medication issues with her.   He would need to be triaged by a nurse before an appt could be made.   She verbalized understanding and will call him and have him call in for the appt.   No triage done.    Message being sent to office. 2. ONSET: When did it start? (e.g., minutes, hours, days)       3. DURATION: How long does it last (e.g., seconds, minutes, hours)      4. PATTERN Does it come and go, or has it been constant since it started?  Does it get worse with exertion?   Are you feeling it now?      5. TAP: Using your hand, can you tap out what you are feeling on a chair or table in front of you, so that I can hear? Note: Not all patients can do this.  6. HEART RATE: Can you tell me your heart rate? How many beats in 15 seconds?  Note: Not all patients can do this.        7. RECURRENT SYMPTOM: Have you ever had this before? If Yes, ask: When was the last time? and What happened that time?       8. CAUSE: What do you think is causing the palpitations?      9. CARDIAC HISTORY: Do you have any history of heart disease? (e.g., heart attack, angina, bypass surgery, angioplasty, arrhythmia)       10. OTHER SYMPTOMS: Do you have any other symptoms? (e.g., dizziness, chest pain, sweating, difficulty breathing)        11. PREGNANCY: Is there any chance you are pregnant? When was your last menstrual period?  Protocols used: Heart Rate and Heartbeat Questions-A-AH

## 2024-03-28 NOTE — Telephone Encounter (Signed)
Pt has appt on 7/16.

## 2024-03-28 NOTE — Telephone Encounter (Signed)
 FYI Only or Action Required?: FYI only for provider.  Patient was last seen in primary care on 10/03/2021 by Hanford Powell BRAVO, NP.  Called Nurse Triage reporting Dizziness.  Symptoms began several days ago.  Interventions attempted: Rest, hydration, or home remedies.  Symptoms are: unchanged.  Triage Disposition: See PCP When Office is Open (Within 3 Days)  Patient/caregiver understands and will follow disposition?: Yes, will follow disposition  Pt denies s/s at this time, states only when he is working outside.   Copied from CRM [id number]. Topic: Clinical - Red Word Triage >> Mar 28, 2024  8:36 AM Harlene ORN wrote: Red Word that prompted transfer to Nurse Triage: dizziness and loss of balance, when working out in this hot weather. Reason for Disposition  [1] MILD dizziness (e.g., walking normally) AND [2] has NOT been evaluated by doctor (or NP/PA) for this  (Exception: Dizziness caused by heat exposure, sudden standing, or poor fluid intake.)  Answer Assessment - Initial Assessment Questions 1. DESCRIPTION: Describe your dizziness.     Felt like I was going to pass out 2. LIGHTHEADED: Do you feel lightheaded? (e.g., somewhat faint, woozy, weak upon standing)     Felt like he was going to pass out 3. VERTIGO: Do you feel like either you or the room is spinning or tilting? (i.e., vertigo)     Pt states that it only occurs outside, states that he feels he might be dehydrated 4. SEVERITY: How bad is it?  Do you feel like you are going to faint? Can you stand and walk?     States that when he is outside and wanting to do yard work he feels dizzy and lightheaded. States no s/s when he is inside 5. ONSET:  When did the dizziness begin?     4 days ago 6. AGGRAVATING FACTORS: Does anything make it worse? (e.g., standing, change in head position)     States that he can walk and move without disturbances. States only happens outside. States that he might be dehydrated 7.  HEART RATE: Can you tell me your heart rate? How many beats in 15 seconds?  (Note: Not all patients can do this.)       denies 8. CAUSE: What do you think is causing the dizziness? (e.g., decreased fluids or food, diarrhea, emotional distress, heat exposure, new medicine, sudden standing, vomiting; unknown)     dehydration 9. RECURRENT SYMPTOM: Have you had dizziness before? If Yes, ask: When was the last time? What happened that time?     denies 10. OTHER SYMPTOMS: Do you have any other symptoms? (e.g., fever, chest pain, vomiting, diarrhea, bleeding)       denies  Protocols used: Dizziness - Lightheadedness-A-AH

## 2024-03-30 ENCOUNTER — Encounter: Payer: Self-pay | Admitting: Family Medicine

## 2024-03-30 ENCOUNTER — Ambulatory Visit (INDEPENDENT_AMBULATORY_CARE_PROVIDER_SITE_OTHER): Admitting: Family Medicine

## 2024-03-30 VITALS — BP 184/115 | HR 87 | Ht 72.0 in | Wt 211.1 lb

## 2024-03-30 DIAGNOSIS — R55 Syncope and collapse: Secondary | ICD-10-CM | POA: Diagnosis not present

## 2024-03-30 DIAGNOSIS — Z125 Encounter for screening for malignant neoplasm of prostate: Secondary | ICD-10-CM | POA: Diagnosis not present

## 2024-03-30 DIAGNOSIS — N529 Male erectile dysfunction, unspecified: Secondary | ICD-10-CM | POA: Diagnosis not present

## 2024-03-30 DIAGNOSIS — F17209 Nicotine dependence, unspecified, with unspecified nicotine-induced disorders: Secondary | ICD-10-CM | POA: Diagnosis not present

## 2024-03-30 MED ORDER — SILDENAFIL CITRATE 50 MG PO TABS: 25.0000 mg | ORAL_TABLET | Freq: Every day | ORAL | 1 refills | Status: AC | PRN

## 2024-03-30 NOTE — Progress Notes (Unsigned)
 Acute Office Visit  Subjective:     Patient ID: Levi Johnston, male    DOB: 12-10-1965, 58 y.o.   MRN: [id number]  Chief Complaint  Patient presents with   Dizziness    HPI Patient is in today for   Subjective - Reports multiple episodes of dizziness over 3-4 consecutive days last week. - Episodes occurred while doing yard work, associated with profuse sweating. - Describes the sensation as lightheadedness, weakness, and loss of stability, which prompted taking a knee. - Felt close to passing out but did not lose consciousness. - Following an episode, felt unable to function properly, including difficulty with driving. - Admits to a history of poor hydration but is trying to improve. - During episodes, vision would go dark. - Denies room-spinning vertigo. - Reports significant shortness of breath and panting with minimal exertion, disproportionate to the activity level. Denies heart fluttering. - Reports erectile dysfunction with recent failed attempts, leading to avoidance of sexual situations. Reports this is a change from baseline.  Medications Reports no current medications.  PMH, PSH, FH, Social Hx PMH: No significant past medical history mentioned. Daughter is a type 1 diabetic; reports a fingerstick blood glucose of 125 after dinner recently. PSH: No past surgical history mentioned. FH: Significant family history of cancer. Mother and brother died of cancer. Brother had throat cancer at age 3. Social Hx: Smoker. Diet is described as not the best. Denies regular exercise recently but was previously active with running and boxing. Works in [employer]. Single father. Expresses significant worry about health and cancer risk.  ROS Constitutional: Reports weakness. Vision: Reports worsening vision and a lazy eye. Cardiovascular: Denies heart fluttering. Reports shortness of breath with minimal exertion. Neurological: Reports lightheadedness and  presyncope. Musculoskeletal: Denies leg swelling.  Objective CV: Regular rhythm, no murmurs. PULM: Lungs clear to auscultation bilaterally. EXTREMITIES: No peripheral edema.  Assessment and Plan # Dizziness/Presyncope Presents with several episodes of presyncope characterized by lightheadedness, weakness, loss of stability, and darkening vision. These occurred while performing yard work in the heat with associated diaphoresis and shortness of breath. Symptoms are likely related to heat exposure and dehydration given the context. Denies vertigo or palpitations. Exam is unremarkable. - Labs to be drawn to check for underlying causes including thyroid  function, electrolytes, and blood glucose. - EKG to be performed today to assess for any cardiac rhythm abnormalities. - Counseled on avoiding heat exposure, especially during peak hours, and maintaining adequate hydration with water. - Advised to ease back into exercise, starting with low-impact indoor activities and avoiding running in the heat to prevent injury from a fall. - Plan to follow up in 1-2 months for a physical exam to discuss results and further management.  # Tobacco Use / Health Maintenance History of smoking and a strong family history of cancer. Expresses concern about his health and cancer risk. - Counseled on smoking cessation as the most impactful health intervention. - Ordered annual low-dose CT scan of the chest for lung cancer screening. - Added PSA to lab panel for prostate cancer screening. - Will schedule a physical exam in 1-2 months to discuss comprehensive health maintenance and cancer screenings.  # Erectile Dysfunction Reports recent onset of erectile dysfunction, causing distress and avoidance of intimacy. This is likely multifactorial, with contributions from age, smoking, and deconditioning. - Prescribed Sildenafil . - Counseled on use: take as needed 2-4 hours prior to intercourse, no more than once daily. -  Advised to start with a half tablet and  titrate up to a full tablet if needed. - Warned about potential side effects including headache and dizziness, and advised not to take it before being active in the heat. - Explained that insurance may not cover it and provided information on using a GoodRx coupon to reduce cost. - Re-emphasized the benefits of smoking cessation and cardiovascular exercise for improving erectile function.     ROS      Objective:    BP (!) 157/105   Pulse 87   Ht 6' (1.829 m)   Wt 211 lb 1.9 oz (95.8 kg)   SpO2 98%   BMI 28.63 kg/m  {Vitals History (Optional):23777}  Physical Exam  No results found for any visits on 03/30/24.      Assessment & Plan:   There are no diagnoses linked to this encounter.   No follow-ups on file.  Toribio MARLA Slain, MD

## 2024-03-30 NOTE — Patient Instructions (Addendum)
 It was nice to see you today,  We addressed the following topics today: -Your EKG did not show any reason for your dizziness episodes. - Your dizziness is likely related to something called heat syncope or presyncope.  Basically the heat caused your to become lightheaded.  Possibly dehydration was involved as well.  Make sure if you go outside during the middle of day that you are staying well-hydrated. - I am getting some additional tests. - I am going to send in the CT scan to screen for lung cancer - I have sent in Viagra .  Make sure that you tell them you want to use your GoodRx coupon to make the price more affordable.   Have a great day,  Rolan Slain, MD

## 2024-04-01 ENCOUNTER — Encounter: Payer: Self-pay | Admitting: Advanced Practice Midwife

## 2024-04-01 DIAGNOSIS — R55 Syncope and collapse: Secondary | ICD-10-CM | POA: Insufficient documentation

## 2024-04-01 DIAGNOSIS — N529 Male erectile dysfunction, unspecified: Secondary | ICD-10-CM | POA: Insufficient documentation

## 2024-04-01 NOTE — Assessment & Plan Note (Signed)
 History of smoking and a strong family history of cancer. Expresses concern about his health and cancer risk. - Counseled on smoking cessation as the most impactful health intervention. - Ordered annual low-dose CT scan of the chest for lung cancer screening. - Added PSA to lab panel for prostate cancer screening. - Will schedule a physical exam in 1-2 months to discuss comprehensive health maintenance and cancer screenings.

## 2024-04-01 NOTE — Assessment & Plan Note (Signed)
 Presents with several episodes of presyncope characterized by lightheadedness, weakness, loss of stability, and darkening vision. These occurred while performing yard work in the heat with associated diaphoresis and shortness of breath. Symptoms are likely related to heat exposure and dehydration given the context. Denies vertigo or palpitations. Exam is unremarkable. - Labs to be drawn to check for underlying causes including thyroid  function, electrolytes, and blood glucose. - EKG negative for arrhythmia.   - Counseled on avoiding heat exposure, especially during peak hours, and maintaining adequate hydration with water. - Advised to ease back into exercise, starting with low-impact indoor activities and avoiding running in the heat to prevent injury from a fall. - Plan to follow up in 1-2 months for a physical exam to discuss results and further management.

## 2024-04-01 NOTE — Assessment & Plan Note (Signed)
 Reports recent onset of erectile dysfunction, causing distress and avoidance of intimacy. This is likely multifactorial, with contributions from age, smoking, and deconditioning. - Prescribed Sildenafil . - Counseled on use: take as needed 2-4 hours prior to intercourse, no more than once daily. - Advised to start with a half tablet and titrate up to a full tablet if needed. - Warned about potential side effects including headache and dizziness, and advised not to take it before being active in the heat. - Explained that insurance may not cover it and provided information on using a GoodRx coupon to reduce cost. - Re-emphasized the benefits of smoking cessation and cardiovascular exercise for improving erectile function.

## 2024-04-04 ENCOUNTER — Other Ambulatory Visit

## 2024-04-04 DIAGNOSIS — R55 Syncope and collapse: Secondary | ICD-10-CM

## 2024-04-04 DIAGNOSIS — F17209 Nicotine dependence, unspecified, with unspecified nicotine-induced disorders: Secondary | ICD-10-CM | POA: Diagnosis not present

## 2024-04-04 DIAGNOSIS — N529 Male erectile dysfunction, unspecified: Secondary | ICD-10-CM

## 2024-04-04 DIAGNOSIS — Z125 Encounter for screening for malignant neoplasm of prostate: Secondary | ICD-10-CM

## 2024-04-05 LAB — CBC WITH DIFFERENTIAL/PLATELET
Basophils Absolute: 0.1 x10E3/uL (ref 0.0–0.2)
Basos: 1 %
EOS (ABSOLUTE): 0.3 x10E3/uL (ref 0.0–0.4)
Eos: 3 %
Hematocrit: 52 % — ABNORMAL HIGH (ref 37.5–51.0)
Hemoglobin: 17.3 g/dL (ref 13.0–17.7)
Immature Grans (Abs): 0.1 x10E3/uL (ref 0.0–0.1)
Immature Granulocytes: 1 %
Lymphocytes Absolute: 4.4 x10E3/uL — ABNORMAL HIGH (ref 0.7–3.1)
Lymphs: 45 %
MCH: 29.1 pg (ref 26.6–33.0)
MCHC: 33.3 g/dL (ref 31.5–35.7)
MCV: 88 fL (ref 79–97)
Monocytes Absolute: 0.9 x10E3/uL (ref 0.1–0.9)
Monocytes: 9 %
Neutrophils Absolute: 3.9 x10E3/uL (ref 1.4–7.0)
Neutrophils: 41 %
Platelets: 287 x10E3/uL (ref 150–450)
RBC: 5.94 x10E6/uL — ABNORMAL HIGH (ref 4.14–5.80)
RDW: 13.8 % (ref 11.6–15.4)
WBC: 9.6 x10E3/uL (ref 3.4–10.8)

## 2024-04-05 LAB — HEMOGLOBIN A1C
Est. average glucose Bld gHb Est-mCnc: 134 mg/dL
Hgb A1c MFr Bld: 6.3 % — ABNORMAL HIGH (ref 4.8–5.6)

## 2024-04-05 LAB — B12 AND FOLATE PANEL
Folate: 11.3 ng/mL (ref >3.0)
Vitamin B-12: 616 pg/mL (ref 232–1245)

## 2024-04-05 LAB — PSA: Prostate Specific Ag, Serum: 2.2 ng/mL (ref 0.0–4.0)

## 2024-04-05 LAB — COMPREHENSIVE METABOLIC PANEL WITH GFR
ALT: 47 IU/L — ABNORMAL HIGH (ref 0–44)
AST: 25 IU/L (ref 0–40)
Albumin: 4.1 g/dL (ref 3.8–4.9)
Alkaline Phosphatase: 84 IU/L (ref 44–121)
BUN/Creatinine Ratio: 13 (ref 9–20)
BUN: 17 mg/dL (ref 6–24)
Bilirubin Total: 0.5 mg/dL (ref 0.0–1.2)
CO2: 22 mmol/L (ref 20–29)
Calcium: 9.3 mg/dL (ref 8.7–10.2)
Chloride: 103 mmol/L (ref 96–106)
Creatinine, Ser: 1.26 mg/dL (ref 0.76–1.27)
Globulin, Total: 3 g/dL (ref 1.5–4.5)
Glucose: 109 mg/dL — ABNORMAL HIGH (ref 70–99)
Potassium: 4.5 mmol/L (ref 3.5–5.2)
Sodium: 141 mmol/L (ref 134–144)
Total Protein: 7.1 g/dL (ref 6.0–8.5)
eGFR: 67 mL/min/1.73 (ref >59)

## 2024-04-05 LAB — TESTOSTERONE: Testosterone: 590 ng/dL (ref 264–916)

## 2024-04-05 LAB — TSH: TSH: 3.15 u[IU]/mL (ref 0.450–4.500)

## 2024-04-06 ENCOUNTER — Ambulatory Visit: Payer: Self-pay | Admitting: Family Medicine

## 2024-04-08 ENCOUNTER — Other Ambulatory Visit: Payer: Self-pay | Admitting: Family Medicine

## 2024-04-08 MED ORDER — OLMESARTAN MEDOXOMIL-HCTZ 20-12.5 MG PO TABS: 1.0000 | ORAL_TABLET | Freq: Every day | ORAL | 2 refills | Status: DC

## 2024-06-01 ENCOUNTER — Encounter: Admitting: Family Medicine

## 2024-06-06 ENCOUNTER — Ambulatory Visit (INDEPENDENT_AMBULATORY_CARE_PROVIDER_SITE_OTHER): Admitting: Family Medicine

## 2024-06-06 VITALS — BP 169/113 | HR 78 | Ht 72.0 in | Wt 208.4 lb

## 2024-06-06 DIAGNOSIS — Z0000 Encounter for general adult medical examination without abnormal findings: Secondary | ICD-10-CM | POA: Diagnosis not present

## 2024-06-06 DIAGNOSIS — Z720 Tobacco use: Secondary | ICD-10-CM

## 2024-06-06 DIAGNOSIS — R7303 Prediabetes: Secondary | ICD-10-CM

## 2024-06-06 DIAGNOSIS — I10 Essential (primary) hypertension: Secondary | ICD-10-CM | POA: Diagnosis not present

## 2024-06-06 DIAGNOSIS — F17209 Nicotine dependence, unspecified, with unspecified nicotine-induced disorders: Secondary | ICD-10-CM

## 2024-06-06 MED ORDER — OLMESARTAN MEDOXOMIL-HCTZ 20-12.5 MG PO TABS: 1.0000 | ORAL_TABLET | Freq: Every day | ORAL | 3 refills | Status: DC

## 2024-06-06 MED ORDER — NICOTINE POLACRILEX 2 MG MT LOZG: 2.0000 mg | LOZENGE | OROMUCOSAL | 1 refills | Status: DC | PRN

## 2024-06-06 MED ORDER — NICOTINE 14 MG/24HR TD PT24: 14.0000 mg | MEDICATED_PATCH | Freq: Every day | TRANSDERMAL | 1 refills | Status: DC

## 2024-06-06 NOTE — Patient Instructions (Signed)
 It was nice to see you today,  We addressed the following topics today: -I am sending in nicotine  replacement for you.  I am sending in patch and lozenge.  You can choose which one you would like your response. - I am sending in a CT scan of your lungs to look for lung cancer screening.  This is done at least once a year.  Someone will call you to schedule it - I have resent in your medication.  I would like you to recheck your blood pressure twice a day, once with an hour of waking up and then again in the evening.  Do this for at least 2 weeks and bring it back to us . - I think to help lose weight you should avoid eating fast food, avoid keeping sweets and desserts in your house so that you do not eat them, and trying 1 of those new plans if they are affordable.  These can include things like Nutrisystem, Optavia, weight watchers, factor, etc.  Have a great day,  Rolan Slain, MD

## 2024-06-06 NOTE — Progress Notes (Unsigned)
 Annual physical  Subjective   Patient ID: Levi Johnston, male    DOB: 10-Jul-1966  Age: 58 y.o. MRN: [id number]  Chief Complaint  Patient presents with   Annual Exam   HPI Levi Johnston is a 58 y.o. old male here  for annual exam.   Subjective - Here for physical exam. No specific concerns. - Reports running out of blood pressure medication at the beginning of the month and has not been taking it. - Home blood pressure readings are now elevated, around 180-190s systolic. One reading was 176/129. Another was approximately 190/130. - When on medication, blood pressure was well-controlled with diastolic readings below 90. - Reports decreased physical activity over the last few years, with reduced strength and endurance. Increased activity recently with walking for hunting and groundskeeping, but no structured cardio. Notes increased heat intolerance, profuse sweating, and fatigue. - Reports binge eating episodes, particularly with sweets like cookies and cakes. Tends to skip breakfast and lunch and eat a large meal at night. This is followed by eating more sweets. Reports rare episodes of waking up choking on regurgitated food after overeating.  Medications Reports prior use of Wellbutrin  for smoking cessation but was non-adherent.  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 PSH: Tonsillectomy, neck surgery for benign tumor removal. FH: Mother had breast cancer with metastases to axilla, back, and intestinal system. Brother died of throat cancer. Grandfather had prostate cancer. Reports binge eating is a family trait. Social Hx: Smokes 1-1.5 packs of cigarettes per day. Expresses desire to quit but has a fear of failure. Drinks coffee and diet soda. Does not drink other sugary beverages. Reports a diet high in carbohydrates, fast food, and  processed sugar (cookies, cakes). Rarely cooks for himself. Will have late night snacks/desserts often  ROS Constitutional: Positive for fatigue, heat intolerance. GI: Positive for nocturnal regurgitation/choking episodes after large meals. Denies projectile vomiting. General: Reports weight gain of 10-15 pounds, mostly abdominal.    The 10-year ASCVD risk score (Arnett DK, et al., 2019) is: 29%  Health Maintenance Due  Topic Date Due   HIV Screening  Never done   Hepatitis C Screening  Never done   Pneumococcal Vaccine: 50+ Years (1 of 2 - PCV) Never done   Hepatitis B Vaccines 19-59 Average Risk (1 of 3 - 19+ 3-dose series) Never done   Lung Cancer Screening  09/17/2022   COVID-19 Vaccine (1 - 2024-25 season) Never done      Objective:     BP (!) 169/113   Pulse 78   Ht 6' (1.829 m)   Wt 208 lb 6.4 oz (94.5 kg)   SpO2 99%   BMI 28.26 kg/m    Physical Exam Gen: alert, oriented. Accompanied by daughter.  HEENT: perrla, eomi, mmm CV: rrr, no murmur Pulm: lctab. No wheeze or crackles.  GI: soft, nbs.  Nontender to palpation MSK: strength equal b/l. Normal gait Ext: no pedal edema Skin: warm and dry, no rashes Psych: mildly anxious appearing   No results found for any visits on 06/06/24.      Assessment & Plan:   Tobacco abuse -     CT CHEST LUNG CANCER SCREENING LOW DOSE WO CONTRAST; Future  Severe hypertension Assessment & Plan: - History of hypertension, previously controlled on medication. Has been off medication for approximately three weeks, with recent home readings as high as 176/129 and 190/130.  Contributing factors include medication non-adherence, smoking, diet, and lack of aerobic exercise. - Refilled prescription olmesartan -hydrochlorothiazide,  90-day supply with refills for one year. - Counselled on home blood pressure monitoring: check twice daily (morning and evening) and record readings. Provided with a log sheet. - Goal BP for now is  <140/90 mmHg. Advised to contact us  if BP remains >160/100 while on medication. - Follow up in 6-8 weeks to recheck blood pressure.   Tobacco use disorder, continuous Assessment & Plan: - Smokes 1-1.5 packs per day. Expresses motivation to quit but also fear of failure. Previous trial of Wellbutrin  was unsuccessful due to non-adherence. - Discussed risks of smoking, including elevated blood pressure. - Discussed nicotine  replacement therapy (NRT). Counselled on using the patch for baseline coverage and lozenges/gum for breakthrough cravings. - Discussed behavioral component of smoking and alternative strategies (e.g., toothpicks, gum). - Sent prescriptions for nicotine  patch and lozenges. - Discussed Chantix as a future option once blood pressure is better controlled. Counselled on potential side effects like vivid dreams. - Ordered a low-dose CT scan of the chest for lung cancer screening. Patient will be called to schedule.   Prediabetes Assessment & Plan: - Labs from 03/2024 showed A1C of 6.3, in the prediabetic range. Also noted elevated ALT, likely related to fatty liver from diet and weight gain. - Diet is high in refined carbohydrates, fast food, and sugar. Reports binge eating patterns. - Counselled on dietary modification, including reducing intake of sweets, high-carbohydrate foods, and fast food. Advised against keeping unhealthy snack foods in the house. - Discussed benefits of meal delivery services (e.g., Factor) or healthy prepared meals to improve diet and facilitate weight loss. - Declined referral to the Healthy Weight and Well-Being clinic at this time. - Will work on dietary changes independently.   Other orders -     Olmesartan  Medoxomil-HCTZ; Take 1 tablet by mouth daily.  Dispense: 90 tablet; Refill: 3 -     Nicotine ; Place 1 patch (14 mg total) onto the skin daily.  Dispense: 28 patch; Refill: 1 -     Nicotine  Polacrilex; Take 1 lozenge (2 mg total) by mouth as  needed for smoking cessation.  Dispense: 100 tablet; Refill: 1     Return in about 6 weeks (around 07/18/2024) for HTN.    Toribio MARLA Slain, MD

## 2024-06-08 DIAGNOSIS — R7303 Prediabetes: Secondary | ICD-10-CM | POA: Insufficient documentation

## 2024-06-08 DIAGNOSIS — I10 Essential (primary) hypertension: Secondary | ICD-10-CM | POA: Insufficient documentation

## 2024-06-08 NOTE — Assessment & Plan Note (Addendum)
-   Smokes 1-1.5 packs per day. Expresses motivation to quit but also fear of failure. Previous trial of Wellbutrin  was unsuccessful due to non-adherence. - Discussed risks of smoking, including elevated blood pressure. - Discussed nicotine  replacement therapy (NRT). Counselled on using the patch for baseline coverage and lozenges/gum for breakthrough cravings. - Discussed behavioral component of smoking and alternative strategies (e.g., toothpicks, gum). - Sent prescriptions for nicotine  patch and lozenges. - Discussed Chantix as a future option once blood pressure is better controlled. Counselled on potential side effects like vivid dreams. - Ordered a low-dose CT scan of the chest for lung cancer screening. Patient will be called to schedule.

## 2024-06-08 NOTE — Assessment & Plan Note (Signed)
-   Labs from 03/2024 showed A1C of 6.3, in the prediabetic range. Also noted elevated ALT, likely related to fatty liver from diet and weight gain. - Diet is high in refined carbohydrates, fast food, and sugar. Reports binge eating patterns. - Counselled on dietary modification, including reducing intake of sweets, high-carbohydrate foods, and fast food. Advised against keeping unhealthy snack foods in the house. - Discussed benefits of meal delivery services (e.g., Factor) or healthy prepared meals to improve diet and facilitate weight loss. - Declined referral to the Healthy Weight and Well-Being clinic at this time. - Will work on dietary changes independently.

## 2024-06-08 NOTE — Assessment & Plan Note (Signed)
-   History of hypertension, previously controlled on medication. Has been off medication for approximately three weeks, with recent home readings as high as 176/129 and 190/130. Contributing factors include medication non-adherence, smoking, diet, and lack of aerobic exercise. - Refilled prescription olmesartan -hydrochlorothiazide,  90-day supply with refills for one year. - Counselled on home blood pressure monitoring: check twice daily (morning and evening) and record readings. Provided with a log sheet. - Goal BP for now is <140/90 mmHg. Advised to contact us  if BP remains >160/100 while on medication. - Follow up in 6-8 weeks to recheck blood pressure.

## 2024-06-09 ENCOUNTER — Encounter: Payer: Self-pay | Admitting: Family Medicine

## 2024-06-20 ENCOUNTER — Ambulatory Visit
Admission: RE | Admit: 2024-06-20 | Discharge: 2024-06-20 | Disposition: A | Discharge status: Home / Self Care | Source: Ambulatory Visit | Attending: Family Medicine | Admitting: Family Medicine

## 2024-06-20 DIAGNOSIS — Z720 Tobacco use: Secondary | ICD-10-CM

## 2024-07-08 ENCOUNTER — Emergency Department (HOSPITAL_COMMUNITY)

## 2024-07-08 ENCOUNTER — Encounter (HOSPITAL_COMMUNITY): Payer: Self-pay

## 2024-07-08 ENCOUNTER — Other Ambulatory Visit: Payer: Self-pay

## 2024-07-08 ENCOUNTER — Inpatient Hospital Stay (HOSPITAL_COMMUNITY)
Admission: EM | Admit: 2024-07-08 | Discharge: 2024-07-11 | DRG: 322 | Disposition: A | Discharge status: Home / Self Care | Attending: Cardiology | Admitting: Cardiology

## 2024-07-08 ENCOUNTER — Ambulatory Visit
Admission: EM | Admit: 2024-07-08 | Discharge: 2024-07-08 | Disposition: A | Discharge status: Home / Self Care | Attending: Physician Assistant | Admitting: Physician Assistant

## 2024-07-08 DIAGNOSIS — Z823 Family history of stroke: Secondary | ICD-10-CM | POA: Diagnosis not present

## 2024-07-08 DIAGNOSIS — Z825 Family history of asthma and other chronic lower respiratory diseases: Secondary | ICD-10-CM

## 2024-07-08 DIAGNOSIS — Z808 Family history of malignant neoplasm of other organs or systems: Secondary | ICD-10-CM | POA: Diagnosis not present

## 2024-07-08 DIAGNOSIS — F32A Depression, unspecified: Secondary | ICD-10-CM | POA: Diagnosis not present

## 2024-07-08 DIAGNOSIS — Z833 Family history of diabetes mellitus: Secondary | ICD-10-CM | POA: Diagnosis not present

## 2024-07-08 DIAGNOSIS — Z955 Presence of coronary angioplasty implant and graft: Secondary | ICD-10-CM

## 2024-07-08 DIAGNOSIS — Z79899 Other long term (current) drug therapy: Secondary | ICD-10-CM

## 2024-07-08 DIAGNOSIS — I2510 Atherosclerotic heart disease of native coronary artery without angina pectoris: Secondary | ICD-10-CM | POA: Diagnosis present

## 2024-07-08 DIAGNOSIS — E785 Hyperlipidemia, unspecified: Secondary | ICD-10-CM | POA: Diagnosis not present

## 2024-07-08 DIAGNOSIS — Z818 Family history of other mental and behavioral disorders: Secondary | ICD-10-CM | POA: Diagnosis not present

## 2024-07-08 DIAGNOSIS — R071 Chest pain on breathing: Secondary | ICD-10-CM | POA: Diagnosis not present

## 2024-07-08 DIAGNOSIS — R079 Chest pain, unspecified: Secondary | ICD-10-CM | POA: Diagnosis not present

## 2024-07-08 DIAGNOSIS — Z803 Family history of malignant neoplasm of breast: Secondary | ICD-10-CM | POA: Diagnosis not present

## 2024-07-08 DIAGNOSIS — N289 Disorder of kidney and ureter, unspecified: Secondary | ICD-10-CM | POA: Diagnosis present

## 2024-07-08 DIAGNOSIS — F17210 Nicotine dependence, cigarettes, uncomplicated: Secondary | ICD-10-CM | POA: Diagnosis present

## 2024-07-08 DIAGNOSIS — Z7982 Long term (current) use of aspirin: Secondary | ICD-10-CM | POA: Diagnosis not present

## 2024-07-08 DIAGNOSIS — F17209 Nicotine dependence, unspecified, with unspecified nicotine-induced disorders: Secondary | ICD-10-CM | POA: Diagnosis present

## 2024-07-08 DIAGNOSIS — Z880 Allergy status to penicillin: Secondary | ICD-10-CM | POA: Diagnosis not present

## 2024-07-08 DIAGNOSIS — Z720 Tobacco use: Secondary | ICD-10-CM | POA: Diagnosis not present

## 2024-07-08 DIAGNOSIS — I10 Essential (primary) hypertension: Secondary | ICD-10-CM | POA: Diagnosis not present

## 2024-07-08 DIAGNOSIS — E782 Mixed hyperlipidemia: Secondary | ICD-10-CM | POA: Diagnosis not present

## 2024-07-08 DIAGNOSIS — R7303 Prediabetes: Secondary | ICD-10-CM | POA: Diagnosis not present

## 2024-07-08 DIAGNOSIS — Z801 Family history of malignant neoplasm of trachea, bronchus and lung: Secondary | ICD-10-CM | POA: Diagnosis not present

## 2024-07-08 DIAGNOSIS — I214 Non-ST elevation (NSTEMI) myocardial infarction: Principal | ICD-10-CM | POA: Diagnosis present

## 2024-07-08 LAB — LIPID PANEL
Cholesterol: 242 mg/dL — ABNORMAL HIGH (ref 0–200)
HDL: 41 mg/dL (ref >40)
LDL Cholesterol: 166 mg/dL — ABNORMAL HIGH (ref 0–99)
Total CHOL/HDL Ratio: 5.9 ratio
Triglycerides: 175 mg/dL — ABNORMAL HIGH (ref <150)
VLDL: 35 mg/dL (ref 0–40)

## 2024-07-08 LAB — TROPONIN I (HIGH SENSITIVITY)
Troponin I (High Sensitivity): 594 ng/L (ref <18)
Troponin I (High Sensitivity): 805 ng/L (ref <18)

## 2024-07-08 LAB — CBC WITH DIFFERENTIAL/PLATELET
Abs Immature Granulocytes: 0.05 K/uL (ref 0.00–0.07)
Basophils Absolute: 0.1 K/uL (ref 0.0–0.1)
Basophils Relative: 1 %
Eosinophils Absolute: 0.1 K/uL (ref 0.0–0.5)
Eosinophils Relative: 1 %
HCT: 53.6 % — ABNORMAL HIGH (ref 39.0–52.0)
Hemoglobin: 17.9 g/dL — ABNORMAL HIGH (ref 13.0–17.0)
Immature Granulocytes: 0 %
Lymphocytes Relative: 29 %
Lymphs Abs: 3.7 K/uL (ref 0.7–4.0)
MCH: 29.1 pg (ref 26.0–34.0)
MCHC: 33.4 g/dL (ref 30.0–36.0)
MCV: 87.2 fL (ref 80.0–100.0)
Monocytes Absolute: 0.8 K/uL (ref 0.1–1.0)
Monocytes Relative: 6 %
Neutro Abs: 8.2 K/uL — ABNORMAL HIGH (ref 1.7–7.7)
Neutrophils Relative %: 63 %
Platelets: 319 K/uL (ref 150–400)
RBC: 6.15 MIL/uL — ABNORMAL HIGH (ref 4.22–5.81)
RDW: 14.4 % (ref 11.5–15.5)
WBC: 12.9 K/uL — ABNORMAL HIGH (ref 4.0–10.5)
nRBC: 0 % (ref 0.0–0.2)

## 2024-07-08 LAB — COMPREHENSIVE METABOLIC PANEL WITH GFR
ALT: 50 U/L — ABNORMAL HIGH (ref 0–44)
AST: 37 U/L (ref 15–41)
Albumin: 4.2 g/dL (ref 3.5–5.0)
Alkaline Phosphatase: 56 U/L (ref 38–126)
Anion gap: 11 (ref 5–15)
BUN: 20 mg/dL (ref 6–20)
CO2: 28 mmol/L (ref 22–32)
Calcium: 9.7 mg/dL (ref 8.9–10.3)
Chloride: 99 mmol/L (ref 98–111)
Creatinine, Ser: 1.47 mg/dL — ABNORMAL HIGH (ref 0.61–1.24)
GFR, Estimated: 55 mL/min — ABNORMAL LOW (ref >60)
Glucose, Bld: 176 mg/dL — ABNORMAL HIGH (ref 70–99)
Potassium: 4.3 mmol/L (ref 3.5–5.1)
Sodium: 138 mmol/L (ref 135–145)
Total Bilirubin: 1 mg/dL (ref 0.0–1.2)
Total Protein: 8.1 g/dL (ref 6.5–8.1)

## 2024-07-08 MED ORDER — IOHEXOL 350 MG/ML SOLN
80.0000 mL | Freq: Once | INTRAVENOUS | Status: AC | PRN
  Administered 2024-07-08: 80 mL via INTRAVENOUS

## 2024-07-08 MED ORDER — ASPIRIN 325 MG PO TABS
325.0000 mg | ORAL_TABLET | Freq: Every day | ORAL | Status: DC
  Administered 2024-07-08: 325 mg via ORAL

## 2024-07-08 MED ORDER — ASPIRIN 81 MG PO TBEC
81.0000 mg | DELAYED_RELEASE_TABLET | Freq: Every day | ORAL | Status: DC
  Administered 2024-07-09 – 2024-07-11 (×3): 81 mg via ORAL
  Filled 2024-07-08 (×3): qty 1

## 2024-07-08 MED ORDER — ACETAMINOPHEN 325 MG PO TABS: 650.0000 mg | ORAL_TABLET | ORAL | Status: DC | PRN

## 2024-07-08 MED ORDER — HEPARIN BOLUS VIA INFUSION
4000.0000 [IU] | Freq: Once | INTRAVENOUS | Status: AC
  Administered 2024-07-08: 4000 [IU] via INTRAVENOUS
  Filled 2024-07-08: qty 4000

## 2024-07-08 MED ORDER — HYDRALAZINE HCL 25 MG PO TABS
25.0000 mg | ORAL_TABLET | Freq: Three times a day (TID) | ORAL | Status: DC
  Administered 2024-07-09 – 2024-07-10 (×5): 25 mg via ORAL
  Filled 2024-07-08 (×5): qty 1

## 2024-07-08 MED ORDER — HEPARIN (PORCINE) 25000 UT/250ML-% IV SOLN
1750.0000 [IU]/h | INTRAVENOUS | Status: DC
  Administered 2024-07-08: 1250 [IU]/h via INTRAVENOUS
  Administered 2024-07-09: 1600 [IU]/h via INTRAVENOUS
  Administered 2024-07-10: 1750 [IU]/h via INTRAVENOUS
  Filled 2024-07-08 (×3): qty 250

## 2024-07-08 MED ORDER — NITROGLYCERIN 0.4 MG SL SUBL: 0.4000 mg | SUBLINGUAL_TABLET | SUBLINGUAL | Status: DC | PRN

## 2024-07-08 MED ORDER — ROSUVASTATIN CALCIUM 20 MG PO TABS
40.0000 mg | ORAL_TABLET | Freq: Every day | ORAL | Status: DC
  Administered 2024-07-09 – 2024-07-11 (×3): 40 mg via ORAL
  Filled 2024-07-08 (×3): qty 2

## 2024-07-08 MED ORDER — ONDANSETRON HCL 4 MG/2ML IJ SOLN: 4.0000 mg | Freq: Four times a day (QID) | INTRAMUSCULAR | Status: DC | PRN

## 2024-07-08 MED ORDER — NICOTINE 14 MG/24HR TD PT24
14.0000 mg | MEDICATED_PATCH | Freq: Every day | TRANSDERMAL | Status: DC
  Administered 2024-07-09 – 2024-07-11 (×3): 14 mg via TRANSDERMAL
  Filled 2024-07-08 (×3): qty 1

## 2024-07-08 MED ORDER — ASPIRIN 81 MG PO CHEW
324.0000 mg | CHEWABLE_TABLET | Freq: Once | ORAL | Status: AC
  Administered 2024-07-08: 324 mg via ORAL
  Filled 2024-07-08: qty 4

## 2024-07-08 NOTE — ED Notes (Signed)
 Patient is being discharged from the Urgent Care and sent to the Emergency Department via Private Vehicle . Per Daughter, patient is in need of higher level of care due to Chest Pain/EKG Changes. Patient is aware and verbalizes understanding of plan of care.  Vitals:   07/08/24 1540  BP: 130/80  Pulse: 91  Resp: 16  Temp: 98.1 F (36.7 C)  SpO2: 96%

## 2024-07-08 NOTE — Discharge Instructions (Addendum)
 Go to [facility] Emergency department for evalaution

## 2024-07-08 NOTE — ED Triage Notes (Addendum)
 Patient mentions a recent trip to [place] , during which the pain began intermittently over several days. Upon returning today, the pain persisted and worsened, accompanied by feelings of lethargy and fatigue.   Note: Limited History due to condition of patient.

## 2024-07-08 NOTE — ED Triage Notes (Signed)
 Pt came in via POV d/t the last 5 days while in the windy [facility] started having intermittent central CP that feels intense when it happens. Reporting that he had to take a knee when it occurred, rating it 10/10. It came back at various times since then & today at approx. 0900 he stated that it rolled in & was very intense & currently in triage has no acute pain. Endorses taking a deep breath felt like it would hurt so kept taking shallow breaths & was going to UC then decided to come into ED.

## 2024-07-08 NOTE — H&P (Signed)
 Cardiology H&P   Patient ID: Levi Johnston MRN: [id number]; DOB: [birth year]  Admit date: 07/08/2024 Date of Consult: 07/08/2024  PCP:  Chandra Toribio POUR, MD   [facility] Providers Cardiologist:  None     Patient Profile: Levi Johnston is a 58 y.o. male with a hx of hypertension and tobacco use who is being seen 07/08/2024 for the evaluation of chest patin at the request of Dr. Dean.  History of Present Illness: Levi Johnston initially developed short episodes of self resolving episodes about 5-6 days ago. He was in [place] at the time and thought it was due to the cold air.   He states that today, the patient became much more severe and did not resolve on its own. His chest pain was associated with shortness of breath and sweating. He has never had this sensation before and states it was a pressure type of pain that was severe in intensity.  The patient states that the pain has now resolved but that he did have an episode of pain while walking into the ED.  The patient has been a longtime smoker and smokes 1ppd and has been doing so for 40 years. His brother passed away from lung cancer 5 years ago, and the patient states he has been depressed since then. He has stopped running and exercising. He now reports dyspnea on exertion with moderate activity.   He denies orthopnea, PND, or leg swelling.  In the ED, VSS. Troponin 594-> 805. Cr 1.47, hgb 17.9. He was given asa 324 mg and started on a heparin gtt.  Past Medical History:  Diagnosis Date   Anxiety    Depression    Hypertension     Past Surgical History:  Procedure Laterality Date   EXCISION MASS NECK Left 05/18/2018   Procedure: EXCISION LEFT LEVEL TWO NECK MASS;  Surgeon: Arlana Arnt, MD;  Location: [facility] OR;  Service: ENT;  Laterality: Left;   TONSILLECTOMY  05/03/2012   Procedure: TONSILLECTOMY;  Surgeon: Ida Loader, MD;  Location: [facility];  Service: ENT;  Laterality: N/A;     Home  Medications:  Prior to Admission medications   Medication Sig Start Date End Date Taking? Authorizing Provider  nicotine  (NICODERM CQ  - DOSED IN MG/24 HOURS) 14 mg/24hr patch Place 1 patch (14 mg total) onto the skin daily. 06/06/24   Chandra Toribio POUR, MD  nicotine  polacrilex (COMMIT) 2 MG lozenge Take 1 lozenge (2 mg total) by mouth as needed for smoking cessation. 06/06/24   Chandra Toribio POUR, MD  olmesartan -hydrochlorothiazide (BENICAR  HCT) 20-12.5 MG tablet Take 1 tablet by mouth daily. 06/06/24   Chandra Toribio POUR, MD  sildenafil  (VIAGRA ) 50 MG tablet Take 0.5-1 tablets (25-50 mg total) by mouth daily as needed for erectile dysfunction. 03/30/24   Chandra Toribio POUR, MD    Scheduled Meds:  NOREEN ON 07/09/2024] aspirin EC  81 mg Oral Daily   [START ON 07/09/2024] hydrALAZINE  25 mg Oral Q8H   [START ON 07/09/2024] nicotine   14 mg Transdermal Daily   [START ON 07/09/2024] rosuvastatin  40 mg Oral Daily   Continuous Infusions:  heparin 1,250 Units/hr (07/08/24 2237)   PRN Meds: acetaminophen , nitroGLYCERIN, ondansetron  (ZOFRAN ) IV Allergies:    Allergies  Allergen Reactions   Penicillins Other (See Comments)    Unknown childhood reaction  Has patient had a PCN reaction causing immediate rash, facial/tongue/throat swelling, SOB or lightheadedness with hypotension: Unknown  Has patient had a PCN reaction causing severe rash involving  mucus membranes or skin necrosis: Unknown  Has patient had a PCN reaction that required hospitalization: Unknown  Has patient had a PCN reaction occurring within the last 10 years: Unknown  If all of the above answers are NO, then may proceed with Cephalosporin use.  Other Reaction(s): Unknown  Unknown childhood reaction, Has patient had a PCN reaction causing immediate rash, facial/tongue/throat swelling, SOB or lightheadedness with hypotension: Unknown, Has patient had a PCN reaction causing severe rash involving mucus membranes or skin necrosis: Unknown,  Has patient had a PCN reaction that required hospitalization: Unknown, Has patient had a PCN reaction occurring within the last 10 years: Unknown, If all of the above answers are NO, then may proceed with Cephalosporin use.    Social History:   Social History   Socioeconomic History   Marital status: Divorced    Spouse name: Not on file   Number of children: Not on file   Years of education: Not on file   Highest education level: GED or equivalent  Occupational History   Not on file  Tobacco Use   Smoking status: Every Day    Current packs/day: 1.00    Average packs/day: 1.2 packs/day for 50.0 years (60.0 ttl pk-yrs)    Types: Cigarettes   Smokeless tobacco: Never   Tobacco comments:    I would like to quit  Vaping Use   Vaping status: Never Used  Substance and Sexual Activity   Alcohol use: Not Currently    Comment: drinks very rare   Drug use: Not Currently    Types: Marijuana, Cocaine    Comment: nothing recently   Sexual activity: Not Currently    Comment: smokes  1 pack a day  Other Topics Concern   Not on file  Social History Narrative   Not on file   Social Drivers of Health   Financial Resource Strain: Medium Risk (06/06/2024)   Overall Financial Resource Strain (CARDIA)    Difficulty of Paying Living Expenses: Somewhat hard  Food Insecurity: No Food Insecurity (06/06/2024)   Hunger Vital Sign    Worried About Running Out of Food in the Last Year: Never true    Ran Out of Food in the Last Year: Never true  Transportation Needs: No Transportation Needs (06/06/2024)   PRAPARE - Administrator, Civil Service (Medical): No    Lack of Transportation (Non-Medical): No  Physical Activity: Insufficiently Active (06/06/2024)   Exercise Vital Sign    Days of Exercise per Week: 3 days    Minutes of Exercise per Session: 20 min  Stress: Stress Concern Present (06/06/2024)   [employer] of Occupational Health - Occupational Stress Questionnaire     Feeling of Stress: Very much  Social Connections: Socially Isolated (06/06/2024)   Social Connection and Isolation Panel    Frequency of Communication with Friends and Family: More than three times a week    Frequency of Social Gatherings with Friends and Family: Once a week    Attends Religious Services: Never    Database administrator or Organizations: No    Attends Engineer, structural: Not on file    Marital Status: Divorced  Catering manager Violence: Not on file    Family History:   Family History  Problem Relation Age of Onset   Breast cancer Mother    Cancer Mother    COPD Mother    Depression Mother    Anxiety disorder Father    Stroke Brother  Throat cancer Brother    Cancer Brother    Depression Brother    Diabetes Daughter      ROS:  Please see the history of present illness.  All other ROS reviewed and negative.     Physical Exam/Data: Vitals:   07/08/24 1716 07/08/24 1720 07/08/24 2130 07/08/24 2222  BP: (!) 161/112  (!) 172/106   Pulse: (!) 104  95   Resp: 18  20   Temp: 98.2 F (36.8 C)   98.3 F (36.8 C)  TempSrc: Oral   Oral  SpO2: 99%  100%   Weight:  93 kg    Height:  6' (1.829 m)     No intake or output data in the 24 hours ending 07/08/24 2353    07/08/2024    5:20 PM 07/08/2024    3:54 PM 06/06/2024    1:39 PM  Last 3 Weights  Weight (lbs) 205 lb 208 lb 5.4 oz 208 lb 6.4 oz  Weight (kg) 92.987 kg 94.5 kg 94.53 kg     Body mass index is 27.8 kg/m.  General:  Well nourished, well developed, in no acute distress, anxious HEENT: normal Neck: no JVD Vascular: No carotid bruits; Distal pulses 2+ bilaterally Cardiac:  normal S1, S2; RRR; soft systolic murmur  Lungs:  clear to auscultation bilaterally, no wheezing, rhonchi or rales  Abd: soft, nontender, no hepatomegaly  Ext: no edema Musculoskeletal:  No deformities, BUE and BLE strength normal and equal Skin: warm and dry  Neuro:  no focal abnormalities noted Psych:  Normal  affect   EKG:  The EKG was personally reviewed and demonstrates:  sinus rhythm with nonspecific STT abnl, STE in III, does not meet STEMI criteria Telemetry:  Telemetry was personally reviewed and demonstrates:  Sinus   Relevant CV Studies: TTE pending  Laboratory Data: High Sensitivity Troponin:   Recent Labs  Lab 07/08/24 1912 07/08/24 2128  TROPONINIHS 594* 805*     Chemistry Recent Labs  Lab 07/08/24 1912  NA 138  K 4.3  CL 99  CO2 28  GLUCOSE 176*  BUN 20  CREATININE 1.47*  CALCIUM 9.7  GFRNONAA 55*  ANIONGAP 11    Recent Labs  Lab 07/08/24 1912  PROT 8.1  ALBUMIN 4.2  AST 37  ALT 50*  ALKPHOS 56  BILITOT 1.0   Lipids  Recent Labs  Lab 07/08/24 2128  CHOL 242*  TRIG 175*  HDL 41  LDLCALC 166*  CHOLHDL 5.9    Hematology Recent Labs  Lab 07/08/24 1912  WBC 12.9*  RBC 6.15*  HGB 17.9*  HCT 53.6*  MCV 87.2  MCH 29.1  MCHC 33.4  RDW 14.4  PLT 319   Thyroid  No results for input(s): TSH, FREET4 in the last 168 hours.  BNPNo results for input(s): BNP, PROBNP in the last 168 hours.  DDimer No results for input(s): DDIMER in the last 168 hours.  Radiology/Studies:  CT Angio Chest PE W and/or Wo Contrast Result Date: 07/08/2024 CLINICAL DATA:  Pain with deep inspiration EXAM: CT ANGIOGRAPHY CHEST WITH CONTRAST TECHNIQUE: Multidetector CT imaging of the chest was performed using the standard protocol during bolus administration of intravenous contrast. Multiplanar CT image reconstructions and MIPs were obtained to evaluate the vascular anatomy. RADIATION DOSE REDUCTION: This exam was performed according to the departmental dose-optimization program which includes automated exposure control, adjustment of the mA and/or kV according to patient size and/or use of iterative reconstruction technique. CONTRAST:  80mL OMNIPAQUE  IOHEXOL  350  MG/ML SOLN COMPARISON:  Chest x-ray 07/08/2024, chest CT 06/20/2024 FINDINGS: Cardiovascular: Satisfactory  opacification of the pulmonary arteries to the segmental level. No evidence of pulmonary embolism. Normal heart size. No pericardial effusion. Nonaneurysmal aorta. No dissection. Mediastinum/Nodes: No enlarged mediastinal, hilar, or axillary lymph nodes. Thyroid  gland, trachea, and esophagus demonstrate no significant findings. Lungs/Pleura: Lungs are clear. No pleural effusion or pneumothorax. Upper Abdomen: No acute finding. Musculoskeletal: No chest wall abnormality. No acute or significant osseous findings. Review of the MIP images confirms the above findings. IMPRESSION: Negative. No CT evidence for acute pulmonary embolus or aortic dissection. Clear lung fields. Electronically Signed   By: Luke Bun M.D.   On: 07/08/2024 22:27   DG Chest 2 View Result Date: 07/08/2024 CLINICAL DATA:  Chest pain EXAM: DG CHEST 2V COMPARISON:  CT 06/20/2024 FINDINGS: The heart size and mediastinal contours are within normal limits. Both lungs are clear. Mild degenerative changes of the spine. IMPRESSION: No active cardiopulmonary disease. Electronically Signed   By: Luke Bun M.D.   On: 07/08/2024 19:33   Assessment and Plan: NSTEMI, type 1 The patient presents with classic anginal symptoms with a dynamic rise in troponin to 500s->800s. ECG is not diagnostic of STEMI but does have STT abnormalities. He does not currently have any chest pain after receiving aspirin. I discussed coronary angiogram with him, and he is agreeable. - Coronary angiogram on Monday - TTE in AM - Aspirin 81 mg daily  - BP control  - ECG and troponin for chest pain   Hypertension Recently diagnosed. On combination olmesartan -hydrochlorothiazide. Given Cr 1.47, will hold off on ARB for now and treat with some hydralazine. - Hydralazine 25 mg q8hrs  Hyperlipidemia  LDL 166. Tchol 242. HDL 41. Not on lipid lowering therapy. - Rosuvastatin 40 mg daily  Tobacco use disorder I discussed with the patient the importance oc smoking  cessation. He understands the risks of smoking to his cardiovascular health. 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 Will avoid ARB until Cr stabilizes, especially in the setting of likely coronary angiogram this admission.  Risk Assessment/Risk Scores:   TIMI Risk Score for Unstable Angina or Non-ST Elevation MI:   The patient's TIMI risk score is 3, which indicates a 13% risk of all cause mortality, new or recurrent myocardial infarction or need for urgent revascularization in the next 14 days.  GRACE score 99  For questions or updates, please contact [facility] Please consult [email or web address] for contact info under    Signed, Jerrell DELENA Orchard, MD  07/08/2024 11:53 PM

## 2024-07-08 NOTE — ED Provider Notes (Signed)
 [place] EMERGENCY DEPARTMENT AT [facility] Provider Note   CSN: [id number] Arrival date & time: 07/08/24  1704     Patient presents with: Chest Pain   Levi Johnston is a 58 y.o. male.   Pt is a 58 yo male with pmhx significant for HTN, HLD, and tobacco abuse.  Pt comes in the ED today with CP.  He noticed it 5 days ago when he was in [place].  The pt said it's been coming and going, but it got really bad today.  Pt initially went to UC and was then sent here.  Pt said pain is gone now.  He did not take his bp med yet (normally takes it around 2300), but said he took it last night.  No hx CAD.       Prior to Admission medications   Medication Sig Start Date End Date Taking? Authorizing Provider  nicotine  (NICODERM CQ  - DOSED IN MG/24 HOURS) 14 mg/24hr patch Place 1 patch (14 mg total) onto the skin daily. 06/06/24   Chandra Toribio POUR, MD  nicotine  polacrilex (COMMIT) 2 MG lozenge Take 1 lozenge (2 mg total) by mouth as needed for smoking cessation. 06/06/24   Chandra Toribio POUR, MD  olmesartan -hydrochlorothiazide (BENICAR  HCT) 20-12.5 MG tablet Take 1 tablet by mouth daily. 06/06/24   Chandra Toribio POUR, MD  sildenafil  (VIAGRA ) 50 MG tablet Take 0.5-1 tablets (25-50 mg total) by mouth daily as needed for erectile dysfunction. 03/30/24   Chandra Toribio POUR, MD    Allergies: Penicillins    Review of Systems  Cardiovascular:  Positive for chest pain.  All other systems reviewed and are negative.   Updated Vital Signs BP (!) 172/106   Pulse 95   Temp 98.3 F (36.8 C) (Oral)   Resp 20   Ht 6' (1.829 m)   Wt 93 kg   SpO2 100%   BMI 27.80 kg/m   Physical Exam Vitals and nursing note reviewed.  Constitutional:      Appearance: He is well-developed.  HENT:     Head: Normocephalic and atraumatic.  Eyes:     Extraocular Movements: Extraocular movements intact.     Pupils: Pupils are equal, round, and reactive to light.  Cardiovascular:     Rate and Rhythm: Normal rate and  regular rhythm.     Heart sounds: Normal heart sounds.  Pulmonary:     Effort: Pulmonary effort is normal.     Breath sounds: Normal breath sounds.  Abdominal:     General: Bowel sounds are normal.     Palpations: Abdomen is soft.  Musculoskeletal:        General: Normal range of motion.     Cervical back: Normal range of motion and neck supple.  Skin:    General: Skin is warm.     Capillary Refill: Capillary refill takes less than 2 seconds.  Neurological:     General: No focal deficit present.     Mental Status: He is alert and oriented to person, place, and time.  Psychiatric:        Mood and Affect: Mood normal.        Behavior: Behavior normal.     (all labs ordered are listed, but only abnormal results are displayed) Labs Reviewed  CBC WITH DIFFERENTIAL/PLATELET - Abnormal; Notable for the following components:      Result Value   WBC 12.9 (*)    RBC 6.15 (*)    Hemoglobin 17.9 (*)  HCT 53.6 (*)    Neutro Abs 8.2 (*)    All other components within normal limits  COMPREHENSIVE METABOLIC PANEL WITH GFR - Abnormal; Notable for the following components:   Glucose, Bld 176 (*)    Creatinine, Ser 1.47 (*)    ALT 50 (*)    GFR, Estimated 55 (*)    All other components within normal limits  LIPID PANEL - Abnormal; Notable for the following components:   Cholesterol 242 (*)    Triglycerides 175 (*)    LDL Cholesterol 166 (*)    All other components within normal limits  TROPONIN I (HIGH SENSITIVITY) - Abnormal; Notable for the following components:   Troponin I (High Sensitivity) 594 (*)    All other components within normal limits  TROPONIN I (HIGH SENSITIVITY) - Abnormal; Notable for the following components:   Troponin I (High Sensitivity) 805 (*)    All other components within normal limits  HEPARIN LEVEL (UNFRACTIONATED)  CBC  HIV ANTIBODY (ROUTINE TESTING W REFLEX)  LIPOPROTEIN A (LPA)  HEMOGLOBIN A1C  BASIC METABOLIC PANEL WITH GFR    EKG: EKG  Interpretation Date/Time:  Friday July 08 2024 19:07:06 EDT Ventricular Rate:  99 PR Interval:  160 QRS Duration:  98 QT Interval:  334 QTC Calculation: 428 R Axis:   70  Text Interpretation: Normal sinus rhythm Possible Inferior infarct , age undetermined Anterior infarct , age undetermined Abnormal ECG When compared with ECG of 08-Jul-2024 17:20, PREVIOUS ECG IS PRESENT No significant change since last tracing Confirmed by Dean Clarity [phone]) on 07/08/2024 9:17:46 PM  Radiology: CT Angio Chest PE W and/or Wo Contrast Result Date: 07/08/2024 CLINICAL DATA:  Pain with deep inspiration EXAM: CT ANGIOGRAPHY CHEST WITH CONTRAST TECHNIQUE: Multidetector CT imaging of the chest was performed using the standard protocol during bolus administration of intravenous contrast. Multiplanar CT image reconstructions and MIPs were obtained to evaluate the vascular anatomy. RADIATION DOSE REDUCTION: This exam was performed according to the departmental dose-optimization program which includes automated exposure control, adjustment of the mA and/or kV according to patient size and/or use of iterative reconstruction technique. CONTRAST:  80mL OMNIPAQUE  IOHEXOL  350 MG/ML SOLN COMPARISON:  Chest x-ray 07/08/2024, chest CT 06/20/2024 FINDINGS: Cardiovascular: Satisfactory opacification of the pulmonary arteries to the segmental level. No evidence of pulmonary embolism. Normal heart size. No pericardial effusion. Nonaneurysmal aorta. No dissection. Mediastinum/Nodes: No enlarged mediastinal, hilar, or axillary lymph nodes. Thyroid  gland, trachea, and esophagus demonstrate no significant findings. Lungs/Pleura: Lungs are clear. No pleural effusion or pneumothorax. Upper Abdomen: No acute finding. Musculoskeletal: No chest wall abnormality. No acute or significant osseous findings. Review of the MIP images confirms the above findings. IMPRESSION: Negative. No CT evidence for acute pulmonary embolus or aortic  dissection. Clear lung fields. Electronically Signed   By: Luke Bun M.D.   On: 07/08/2024 22:27   DG Chest 2 View Result Date: 07/08/2024 CLINICAL DATA:  Chest pain EXAM: DG CHEST 2V COMPARISON:  CT 06/20/2024 FINDINGS: The heart size and mediastinal contours are within normal limits. Both lungs are clear. Mild degenerative changes of the spine. IMPRESSION: No active cardiopulmonary disease. Electronically Signed   By: Luke Bun M.D.   On: 07/08/2024 19:33     Procedures   Medications Ordered in the ED  heparin ADULT infusion 100 units/mL (25000 units/250mL) (1,250 Units/hr Intravenous New Bag/Given 07/08/24 2237)  aspirin EC tablet 81 mg (has no administration in time range)  nitroGLYCERIN (NITROSTAT) SL tablet 0.4 mg (has no administration  in time range)  acetaminophen  (TYLENOL ) tablet 650 mg (has no administration in time range)  ondansetron  (ZOFRAN ) injection 4 mg (has no administration in time range)  rosuvastatin (CRESTOR) tablet 40 mg (has no administration in time range)  aspirin chewable tablet 324 mg (324 mg Oral Given 07/08/24 2154)  heparin bolus via infusion 4,000 Units (4,000 Units Intravenous Bolus from Bag 07/08/24 2239)  iohexol  (OMNIPAQUE ) 350 MG/ML injection 80 mL (80 mLs Intravenous Contrast Given 07/08/24 2217)                                    Medical Decision Making Amount and/or Complexity of Data Reviewed Labs: ordered.  Risk OTC drugs. Prescription drug management. Decision regarding hospitalization.   This patient presents to the ED for concern of cp, this involves an extensive number of treatment options, and is a complaint that carries with it a high risk of complications and morbidity.  The differential diagnosis includes cardiac, pulm, gi   Co morbidities that complicate the patient evaluation  Htn and tobacco abuse   Additional history obtained:  Additional history obtained from epic chart review External records from outside  source obtained and reviewed including daughter   Lab Tests:  I Ordered, and personally interpreted labs.  The pertinent results include:  cbc with wbc sl elevated at 12.9, cmp with glucose elevated at 176 and cr elevated at 1.47 (1.26 on 7/21); trop elevated at 594   Imaging Studies ordered:  I ordered imaging studies including cxr, ct chest  I independently visualized and interpreted imaging which showed  CXR: No active cardiopulmonary disease.  CT chest: Negative. No CT evidence for acute pulmonary embolus or aortic  dissection. Clear lung fields.   I agree with the radiologist interpretation   Cardiac Monitoring:  The patient was maintained on a cardiac monitor.  I personally viewed and interpreted the cardiac monitored which showed an underlying rhythm of: nsr   Medicines ordered and prescription drug management:  I ordered medication including asa/heparin  for sx  Reevaluation of the patient after these medicines showed that the patient improved I have reviewed the patients home medicines and have made adjustments as needed   Test Considered:  ct   Consultations Obtained:  I requested consultation with the cardiologist (Dr. Orlando),  and discussed lab and imaging findings as well as pertinent plan - he will admit  Problem List / ED Course:  NSTEMI:  asa and heparin given.  No cp now.  Pt adm to cards.   Reevaluation:  After the interventions noted above, I reevaluated the patient and found that they have :improved   Social Determinants of Health:  Lives at home   Dispostion:  After consideration of the diagnostic results and the patients response to treatment, I feel that the patent would benefit from admission.       Final diagnoses:  NSTEMI (non-ST elevated myocardial infarction) [facility])  Tobacco abuse    ED Discharge Orders     None          Dean Clarity, MD 07/08/24 2322

## 2024-07-08 NOTE — ED Provider Triage Note (Signed)
 Emergency Medicine Provider Triage Evaluation Note  Levi Johnston , a 58 y.o. male  was evaluated in triage.  Pt complains of chest pain that is episodic.  Patient states he has been experiencing severe chest pain episodically the last 5 days while he was visiting [place].  Patient describes the pain as central and very intense when it happens, to the point where he has to take in the when it occurs rating it as a 10 out of 10 pain.  Patient also endorses pain when taking a deep breath.  Evaluated urgent care and ultimately referred to the emergency department for further evaluation.  Review of Systems  Positive: Chest pain, shortness of breath, tobacco use Negative: Fever, chills, abdominal pain, nausea, vomiting  Physical Exam  BP (!) 161/112   Pulse (!) 104   Temp 98.2 F (36.8 C) (Oral)   Resp 18   Ht 6' (1.829 m)   Wt 93 kg   SpO2 99%   BMI 27.80 kg/m  Gen:   Awake, no distress   Resp:  Normal effort  MSK:   Moves extremities without difficulty  Other:    Medical Decision Making  Medically screening exam initiated at 7:01 PM.  Appropriate orders placed.  Hazim Treadway was informed that the remainder of the evaluation will be completed by another provider, this initial triage assessment does not replace that evaluation, and the importance of remaining in the ED until their evaluation is complete.  Orders: CBC, CMP, troponin, chest x-ray, EKG, CT PE scan   Janetta Terrall FAL[place], [place] 07/08/24 1903

## 2024-07-08 NOTE — ED Provider Notes (Signed)
 [facility]    CSN: [id number] Arrival date & time: 07/08/24  1525      History   Chief Complaint Chief Complaint  Patient presents with   Chest Pain   Fatigue    HPI Levi Johnston is a 58 y.o. male.   Patient complains of chest pain.  Patient reports he has been having pain on and off for the past week.  Patient reports the pain began while he was visiting [facility].  Patient states he thought it was from the wind.  Patient reports that he has had the pain on and off every day since.  Patient states today he had a severe episode.  He tried laying down and the pain continued.  Patient denies any cough or congestion he has not had any fever or chills.  Patient denies any abdominal pain.  Patient reports the pain is on the left side of his chest.  He complains of feeling anxious.  Patient reports he has not had any nausea or vomiting he has not experienced any illness recently.  Patient reports pain does not change with movement.  Pain does not occur with deep breathing.  Patient has a past medical history of hypertension he does not know his cholesterol.  He is a smoker.  The history is provided by the patient. No language interpreter was used.  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   Hypokalemia 10/13/2021   Tobacco use disorder, continuous 10/13/2021   Encounter for general adult medical examination with abnormal findings 10/13/2021   Mixed hyperlipidemia 10/13/2021   Degenerative disc disease, cervical 08/25/2021   Lumbar degenerative disc disease 08/25/2021   Chronic left shoulder pain 08/25/2021   History of smoking at least 1 pack per day for at least 30 years 08/25/2021   Body mass index 28.0-28.9, adult  08/25/2021   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 Authorizing Provider  olmesartan -hydrochlorothiazide (BENICAR  HCT) 20-12.5 MG tablet Take 1 tablet by mouth daily. 06/06/24  Yes Chandra Toribio POUR, MD  nicotine  (NICODERM CQ  - DOSED IN MG/24 HOURS) 14 mg/24hr patch Place 1 patch (14 mg total) onto the skin daily. 06/06/24   Chandra Toribio POUR, MD  nicotine  polacrilex (COMMIT) 2 MG lozenge Take 1 lozenge (2 mg total) by mouth as needed for smoking cessation. 06/06/24   Chandra Toribio POUR, MD  sildenafil  (VIAGRA ) 50 MG tablet Take 0.5-1 tablets (25-50 mg total) by mouth daily as needed for erectile dysfunction. 03/30/24   Chandra Toribio POUR, MD    Family History Family History  Problem Relation Age of Onset   Breast cancer Mother    Cancer Mother    COPD Mother    Depression Mother  Anxiety disorder Father    Stroke Brother    Throat cancer Brother    Cancer Brother    Depression Brother    Diabetes Daughter     Social History Social History   Tobacco Use   Smoking status: Every Day    Current packs/day: 1.00    Average packs/day: 1.2 packs/day for 50.0 years (60.0 ttl pk-yrs)    Types: Cigarettes   Smokeless tobacco: Never   Tobacco comments:    I would like to quit  Vaping Use   Vaping status: Never Used  Substance Use Topics   Alcohol use: Not Currently    Comment: drinks very rare   Drug use: Not Currently    Types: Marijuana, Cocaine    Comment: nothing recently     Allergies   Penicillins   Review of  Systems Review of Systems  Cardiovascular:  Positive for chest pain.  All other systems reviewed and are negative.    Physical Exam Triage Vital Signs ED Triage Vitals  Encounter Vitals Group     BP 07/08/24 1540 130/80     Girls Systolic BP Percentile --      Girls Diastolic BP Percentile --      Boys Systolic BP Percentile --      Boys Diastolic BP Percentile --      Pulse Rate 07/08/24 1540 91     Resp 07/08/24 1540 16     Temp 07/08/24 1540 98.1 F (36.7 C)     Temp Source 07/08/24 1540 Oral     SpO2 07/08/24 1540 96 %     Weight 07/08/24 1554 208 lb 5.4 oz (94.5 kg)     Height 07/08/24 1554 6' (1.829 m)     Head Circumference --      Peak Flow --      Pain Score 07/08/24 1552 3     Pain Loc --      Pain Education --      Exclude from Growth Chart --    No data found.  Updated Vital Signs BP 130/80   Pulse 91   Temp 98.1 F (36.7 C) (Oral)   Resp 16   Ht 6' (1.829 m)   Wt 94.5 kg   SpO2 96%   BMI 28.26 kg/m   Visual Acuity Right Eye Distance:   Left Eye Distance:   Bilateral Distance:    Right Eye Near:   Left Eye Near:    Bilateral Near:     Physical Exam Vitals and nursing note reviewed.  Constitutional:      Appearance: He is well-developed.  HENT:     Head: Normocephalic.  Cardiovascular:     Rate and Rhythm: Normal rate.     Heart sounds: Normal heart sounds.  Pulmonary:     Effort: Pulmonary effort is normal.     Breath sounds: Normal breath sounds.  Abdominal:     General: There is no distension.     Palpations: Abdomen is soft.  Musculoskeletal:        General: Normal range of motion.     Cervical back: Normal range of motion.  Skin:    General: Skin is warm.  Neurological:     General: No focal deficit present.     Mental Status: He is alert and oriented to person, place, and time.     EKG normal sinus no ST elevation, patient given baby aspirin.  He is currently pain-free.  I am concerned the patient  is having angina.  He  is advised to go to the [facility] emergency department to be seen for evaluation. UC Treatments / Results  Labs (all labs ordered are listed, but only abnormal results are displayed) Labs Reviewed - No data to display  EKG   Radiology No results found.  Procedures Procedures (including critical care time)  Medications Ordered in UC Medications  aspirin tablet 325 mg (325 mg Oral Given 07/08/24 1609)    Initial Impression / Assessment and Plan / UC Course  I have reviewed the triage vital signs and the nursing notes.  Pertinent labs & imaging results that were available during my care of the patient were reviewed by me and considered in my medical decision making (see chart for details).      Final Clinical Impressions(s) / UC Diagnoses   Final diagnoses:  Chest pain, unspecified type     Discharge Instructions      Go to [facility] Emergency department for evalaution    ED Prescriptions   None    PDMP not reviewed this encounter. An After Visit Summary was printed and given to the patient.       Flint Sonny POUR, PA-C 07/08/24 8168

## 2024-07-08 NOTE — Progress Notes (Signed)
 PHARMACY - ANTICOAGULATION CONSULT NOTE  Pharmacy Consult for heparin Indication: chest pain/ACS  Allergies  Allergen Reactions   Penicillins Other (See Comments)    Unknown childhood reaction  Has patient had a PCN reaction causing immediate rash, facial/tongue/throat swelling, SOB or lightheadedness with hypotension: Unknown  Has patient had a PCN reaction causing severe rash involving mucus membranes or skin necrosis: Unknown  Has patient had a PCN reaction that required hospitalization: Unknown  Has patient had a PCN reaction occurring within the last 10 years: Unknown  If all of the above answers are NO, then may proceed with Cephalosporin use.  Other Reaction(s): Unknown  Unknown childhood reaction, Has patient had a PCN reaction causing immediate rash, facial/tongue/throat swelling, SOB or lightheadedness with hypotension: Unknown, Has patient had a PCN reaction causing severe rash involving mucus membranes or skin necrosis: Unknown, Has patient had a PCN reaction that required hospitalization: Unknown, Has patient had a PCN reaction occurring within the last 10 years: Unknown, If all of the above answers are NO, then may proceed with Cephalosporin use.    Patient Measurements: Height: 6' (182.9 cm) Weight: 93 kg (205 lb) IBW/kg (Calculated) : 77.6 HEPARIN DW (KG): 93  Vital Signs: Temp: 98.2 F (36.8 C) (10/24 1716) Temp Source: Oral (10/24 1716) BP: 161/112 (10/24 1716) Pulse Rate: 104 (10/24 1716)  Labs: Recent Labs    07/08/24 1912  HGB 17.9*  HCT 53.6*  PLT 319  CREATININE 1.47*  TROPONINIHS 594*    Estimated Creatinine Clearance: 60.9 mL/min (A) (by C-G formula based on SCr of 1.47 mg/dL (H)).   Medical History: Past Medical History:  Diagnosis Date   Anxiety    Depression    Hypertension     Assessment: Levi Johnston presenting with CP, elevated troponin, he is not on anticoagulation PTA  Goal of Therapy:  Heparin level 0.3-0.7  units/ml Monitor platelets by anticoagulation protocol: Yes   Plan:  Heparin 4000 units IV x 1, and gtt at 1250 units/hr F/u 6 hour heparin level F/u cards eval and recs  Dorn Poot, PharmD, [facility] Clinical Pharmacist ED Pharmacist Phone # [phone] 07/08/2024 9:53 PM

## 2024-07-09 ENCOUNTER — Inpatient Hospital Stay (HOSPITAL_COMMUNITY)

## 2024-07-09 DIAGNOSIS — I214 Non-ST elevation (NSTEMI) myocardial infarction: Secondary | ICD-10-CM

## 2024-07-09 LAB — BASIC METABOLIC PANEL WITH GFR
Anion gap: 12 (ref 5–15)
BUN: 24 mg/dL — ABNORMAL HIGH (ref 6–20)
CO2: 27 mmol/L (ref 22–32)
Calcium: 8.9 mg/dL (ref 8.9–10.3)
Chloride: 97 mmol/L — ABNORMAL LOW (ref 98–111)
Creatinine, Ser: 1.37 mg/dL — ABNORMAL HIGH (ref 0.61–1.24)
GFR, Estimated: >60 mL/min (ref >60)
Glucose, Bld: 170 mg/dL — ABNORMAL HIGH (ref 70–99)
Potassium: 3.9 mmol/L (ref 3.5–5.1)
Sodium: 136 mmol/L (ref 135–145)

## 2024-07-09 LAB — ECHOCARDIOGRAM COMPLETE
Area-P 1/2: 3.99 cm2
Height: 72 in
S' Lateral: 2.5 cm
Weight: 3280 [oz_av]

## 2024-07-09 LAB — CBC
HCT: 48.1 % (ref 39.0–52.0)
Hemoglobin: 16.5 g/dL (ref 13.0–17.0)
MCH: 29.6 pg (ref 26.0–34.0)
MCHC: 34.3 g/dL (ref 30.0–36.0)
MCV: 86.4 fL (ref 80.0–100.0)
Platelets: 280 K/uL (ref 150–400)
RBC: 5.57 MIL/uL (ref 4.22–5.81)
RDW: 14.5 % (ref 11.5–15.5)
WBC: 10.6 K/uL — ABNORMAL HIGH (ref 4.0–10.5)
nRBC: 0 % (ref 0.0–0.2)

## 2024-07-09 LAB — HEMOGLOBIN A1C
Hgb A1c MFr Bld: 6.1 % — ABNORMAL HIGH (ref 4.8–5.6)
Mean Plasma Glucose: 128.37 mg/dL

## 2024-07-09 LAB — HEPARIN LEVEL (UNFRACTIONATED)
Heparin Unfractionated: <0.1 [IU]/mL — ABNORMAL LOW (ref 0.30–0.70)
Heparin Unfractionated: 0.28 [IU]/mL — ABNORMAL LOW (ref 0.30–0.70)
Heparin Unfractionated: 0.35 [IU]/mL (ref 0.30–0.70)

## 2024-07-09 LAB — HIV ANTIBODY (ROUTINE TESTING W REFLEX): HIV Screen 4th Generation wRfx: NONREACTIVE

## 2024-07-09 MED ORDER — HEPARIN BOLUS VIA INFUSION
1000.0000 [IU] | Freq: Once | INTRAVENOUS | Status: AC
  Administered 2024-07-09: 1000 [IU] via INTRAVENOUS
  Filled 2024-07-09: qty 1000

## 2024-07-09 MED ORDER — HEPARIN BOLUS VIA INFUSION
2000.0000 [IU] | Freq: Once | INTRAVENOUS | Status: AC
  Administered 2024-07-09: 2000 [IU] via INTRAVENOUS
  Filled 2024-07-09: qty 2000

## 2024-07-09 NOTE — Progress Notes (Signed)
  Echocardiogram 2D Echocardiogram has been performed.  Levi Johnston 07/09/2024, 6:04 PM

## 2024-07-09 NOTE — Progress Notes (Signed)
 ANTICOAGULATION CONSULT NOTE  Pharmacy Consult for Heparin Indication: chest pain/ACS  Allergies  Allergen Reactions   Penicillins Other (See Comments)    Unknown childhood reaction  Has patient had a PCN reaction causing immediate rash, facial/tongue/throat swelling, SOB or lightheadedness with hypotension: Unknown  Has patient had a PCN reaction causing severe rash involving mucus membranes or skin necrosis: Unknown  Has patient had a PCN reaction that required hospitalization: Unknown  Has patient had a PCN reaction occurring within the last 10 years: Unknown  If all of the above answers are NO, then may proceed with Cephalosporin use.  Other Reaction(s): Unknown  Unknown childhood reaction, Has patient had a PCN reaction causing immediate rash, facial/tongue/throat swelling, SOB or lightheadedness with hypotension: Unknown, Has patient had a PCN reaction causing severe rash involving mucus membranes or skin necrosis: Unknown, Has patient had a PCN reaction that required hospitalization: Unknown, Has patient had a PCN reaction occurring within the last 10 years: Unknown, If all of the above answers are NO, then may proceed with Cephalosporin use.    Patient Measurements: Height: 6' (182.9 cm) Weight: 93 kg (205 lb) IBW/kg (Calculated) : 77.6 Heparin Dosing Weight: 93 kg  Vital Signs: Temp: 98.3 F (36.8 C) (10/25 2100) Temp Source: Oral (10/25 2100) BP: 105/61 (10/25 2100) Pulse Rate: 97 (10/25 2100)  Labs: Recent Labs    07/08/24 1912 07/08/24 2128 07/09/24 0500 07/09/24 1300 07/09/24 2039  HGB 17.9*  --  16.5  --   --   HCT 53.6*  --  48.1  --   --   PLT 319  --  280  --   --   HEPARINUNFRC  --   --  <0.10* 0.28* 0.35  CREATININE 1.47*  --  1.37*  --   --   TROPONINIHS 594* 805*  --   --   --     Estimated Creatinine Clearance: 65.3 mL/min (A) (by C-G formula based on SCr of 1.37 mg/dL (H)).   Medical History: Past Medical History:  Diagnosis Date    Anxiety    Depression    Hypertension     Medications:  Medications Prior to Admission  Medication Sig Dispense Refill Last Dose/Taking   olmesartan -hydrochlorothiazide (BENICAR  HCT) 20-12.5 MG tablet Take 1 tablet by mouth daily. 90 tablet 3 07/08/2024   nicotine  (NICODERM CQ  - DOSED IN MG/24 HOURS) 14 mg/24hr patch Place 1 patch (14 mg total) onto the skin daily. 28 patch 1 Unknown   nicotine  polacrilex (COMMIT) 2 MG lozenge Take 1 lozenge (2 mg total) by mouth as needed for smoking cessation. 100 tablet 1 Unknown   sildenafil  (VIAGRA ) 50 MG tablet Take 0.5-1 tablets (25-50 mg total) by mouth daily as needed for erectile dysfunction. 10 tablet 1 More than a month   Scheduled:   aspirin EC  81 mg Oral Daily   hydrALAZINE  25 mg Oral Q8H   nicotine   14 mg Transdermal Daily   rosuvastatin  40 mg Oral Daily   Infusions:   heparin 1,750 Units/hr (07/09/24 1432)   PRN: acetaminophen , nitroGLYCERIN, ondansetron  (ZOFRAN ) IV  Assessment: 58 yom with a history of HTN, tobacco use. Patient is presenting with chest pain. Heparin per pharmacy consult placed for chest pain/ACS.  Patient was not on anticoagulation prior to arrival.  Heparin level tonight is therapeutic.  Goal of Therapy:  Heparin level 0.3-0.7 units/ml Monitor platelets by anticoagulation protocol: Yes   Plan:  Continue heparin 1750 units/h Daily heparin level and CBC  Ozell Jamaica, PharmD, BCPS, [facility] Clinical Pharmacist [phone] Please check AMION for all [facility] Pharmacy numbers 07/09/2024

## 2024-07-09 NOTE — Plan of Care (Signed)
   Problem: Activity: Goal: Ability to tolerate increased activity will improve Outcome: Progressing

## 2024-07-09 NOTE — ED Notes (Signed)
 CCMD notified of patient in room 54

## 2024-07-09 NOTE — Progress Notes (Signed)
 ANTICOAGULATION CONSULT NOTE  Pharmacy Consult for Heparin Indication: chest pain/ACS  Allergies  Allergen Reactions   Penicillins Other (See Comments)    Unknown childhood reaction  Has patient had a PCN reaction causing immediate rash, facial/tongue/throat swelling, SOB or lightheadedness with hypotension: Unknown  Has patient had a PCN reaction causing severe rash involving mucus membranes or skin necrosis: Unknown  Has patient had a PCN reaction that required hospitalization: Unknown  Has patient had a PCN reaction occurring within the last 10 years: Unknown  If all of the above answers are NO, then may proceed with Cephalosporin use.  Other Reaction(s): Unknown  Unknown childhood reaction, Has patient had a PCN reaction causing immediate rash, facial/tongue/throat swelling, SOB or lightheadedness with hypotension: Unknown, Has patient had a PCN reaction causing severe rash involving mucus membranes or skin necrosis: Unknown, Has patient had a PCN reaction that required hospitalization: Unknown, Has patient had a PCN reaction occurring within the last 10 years: Unknown, If all of the above answers are NO, then may proceed with Cephalosporin use.    Patient Measurements: Height: 6' (182.9 cm) Weight: 93 kg (205 lb) IBW/kg (Calculated) : 77.6 Heparin Dosing Weight: 93 kg  Vital Signs: Temp: 97.4 F (36.3 C) (10/25 1221) Temp Source: Oral (10/25 1221) BP: 127/81 (10/25 1300) Pulse Rate: 93 (10/25 1300)  Labs: Recent Labs    07/08/24 1912 07/08/24 2128 07/09/24 0500 07/09/24 1300  HGB 17.9*  --  16.5  --   HCT 53.6*  --  48.1  --   PLT 319  --  280  --   HEPARINUNFRC  --   --  <0.10* 0.28*  CREATININE 1.47*  --  1.37*  --   TROPONINIHS 594* 805*  --   --     Estimated Creatinine Clearance: 65.3 mL/min (A) (by C-G formula based on SCr of 1.37 mg/dL (H)).   Medical History: Past Medical History:  Diagnosis Date   Anxiety    Depression    Hypertension      Medications:  (Not in a hospital admission)  Scheduled:   aspirin EC  81 mg Oral Daily   hydrALAZINE  25 mg Oral Q8H   nicotine   14 mg Transdermal Daily   rosuvastatin  40 mg Oral Daily   Infusions:   heparin 1,600 Units/hr (07/09/24 1227)   PRN: acetaminophen , nitroGLYCERIN, ondansetron  (ZOFRAN ) IV  Assessment: 58 yom with a history of HTN, tobacco use. Patient is presenting with chest pain. Heparin per pharmacy consult placed for chest pain/ACS.  Patient was not on anticoagulation prior to arrival. Started on 1250 units/hr IV heparin following 4000 unit IV heparin bolus.   Heparin levels: 10/25 0500: < 0.1 -- Re-bolus w/ 2000 units and increased to 1600 units/hr 10/25 1300: 0.28 -- Remains borderline sub-therapeutic.  No issues with infusion or bleeding per RN.  Hgb 17.9>16.5; plt 319>280  Goal of Therapy:  Heparin level 0.3-0.7 units/ml Monitor platelets by anticoagulation protocol: Yes   Plan:  Give IV heparin 1000 units bolus x 1 Increase heparin infusion to 1750 units/hr Check anti-Xa level at 9pm  Daily heparin level while on heparin Continue to monitor H&H and platelets  Dorn Buttner, PharmD, BCPS 07/09/2024 1:56 PM ED Clinical Pharmacist -  [phone]

## 2024-07-09 NOTE — Progress Notes (Signed)
  Progress Note  Patient Name: Levi Johnston Date of Encounter: 07/09/2024 [facility] Cardiologist: None   Interval Summary   Chest pain resolved. Patient reports feeling relatively well. No new or acute complaints.   Vital Signs Vitals:   07/09/24 0850 07/09/24 1221 07/09/24 1245 07/09/24 1300  BP: (!) 113/100  (!) 150/108 127/81  Pulse: 92  98 93  Resp: 20  19 12   Temp: 98.7 F (37.1 C) (!) 97.4 F (36.3 C)    TempSrc: Oral Oral    SpO2: 96%  100% 97%  Weight:      Height:        Intake/Output Summary (Last 24 hours) at 07/09/2024 1341 Last data filed at 07/09/2024 0240 Gross per 24 hour  Intake 50.63 ml  Output --  Net 50.63 ml      07/08/2024    5:20 PM 07/08/2024    3:54 PM 06/06/2024    1:39 PM  Last 3 Weights  Weight (lbs) 205 lb 208 lb 5.4 oz 208 lb 6.4 oz  Weight (kg) 92.987 kg 94.5 kg 94.53 kg      Telemetry/ECG  SR - Personally Reviewed  Physical Exam  General: Well developed, in no acute distress.  Neck: No JVD.  Cardiac: Normal rate, regular rhythm.  Resp: Normal work of breathing.  Ext: No edema.  Neuro: No gross focal deficits.  Psych: Normal affect.   Assessment & Plan  #. NSTEMI, type 1 - Coronary angiogram on Monday - Echo ordered - Aspirin 81 mg daily  - Heparin infusion - Rosuvastatin 40mg  daily   #. Hypertension: Recently diagnosed. On combination olmesartan -hydrochlorothiazide. Given Cr 1.47, will hold off on ARB for now and treat with some hydralazine. - Hydralazine 25 mg q8hrs   #. Hyperlipidemia: LDL 166. Tchol 242. HDL 41. Not on lipid lowering therapy. - Rosuvastatin 40 mg daily   #. Tobacco use disorder - Nicotine  patch if needed   #. 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 Will avoid ARB until Cr stabilizes, especially in the setting of likely coronary angiogram this admission.  Signed, Fonda Kitty, MD

## 2024-07-09 NOTE — Progress Notes (Signed)
 PHARMACY - ANTICOAGULATION CONSULT NOTE  Pharmacy Consult for heparin Indication: chest pain/ACS  Patient Measurements: Height: 6' (182.9 cm) Weight: 93 kg (205 lb) IBW/kg (Calculated) : 77.6 HEPARIN DW (KG): 93  Vital Signs: Temp: 98.1 F (36.7 C) (10/25 0538) Temp Source: Oral (10/25 0538) BP: 115/74 (10/25 0530) Pulse Rate: 95 (10/25 0530)  Labs: Recent Labs    07/08/24 1912 07/08/24 2128 07/09/24 0500  HGB 17.9*  --  16.5  HCT 53.6*  --  48.1  PLT 319  --  280  HEPARINUNFRC  --   --  <0.10*  CREATININE 1.47*  --   --   TROPONINIHS 594* 805*  --     Estimated Creatinine Clearance: 60.9 mL/min (A) (by C-G formula based on SCr of 1.47 mg/dL (H)).  Assessment: 80 YOM presenting with CP, elevated troponin, he is not on anticoagulation PTA. Pharmacy consulted to dose heparin for NSTEMI  AM: heparin level undetectable on 1250 units/hr. Per RN, no issues with heparin gtt running continuously or signs/symptoms of bleeding. CBC shows Hgb 17.9 >16, plts 319 > 280  Goal of Therapy:  Heparin level 0.3-0.7 units/ml Monitor platelets by anticoagulation protocol: Yes   Plan:  Heparin 2000 units IV x 1, and gtt at 1600 units/hr F/u 6 hour heparin level Coronary angiogram Monday  Lynwood Poplar, PharmD, BCPS Clinical Pharmacist 07/09/2024 6:23 AM

## 2024-07-09 NOTE — ED Notes (Signed)
 Shift report received, patient moved to ER 47, assumed care of patient at this time.

## 2024-07-10 ENCOUNTER — Encounter (HOSPITAL_COMMUNITY): Payer: Self-pay | Admitting: Internal Medicine

## 2024-07-10 ENCOUNTER — Encounter (HOSPITAL_COMMUNITY)
Admission: EM | Disposition: A | Discharge status: Home / Self Care | Payer: Self-pay | Source: Home / Self Care | Attending: Cardiology

## 2024-07-10 DIAGNOSIS — F17210 Nicotine dependence, cigarettes, uncomplicated: Secondary | ICD-10-CM | POA: Diagnosis not present

## 2024-07-10 DIAGNOSIS — I2510 Atherosclerotic heart disease of native coronary artery without angina pectoris: Secondary | ICD-10-CM

## 2024-07-10 DIAGNOSIS — Z833 Family history of diabetes mellitus: Secondary | ICD-10-CM | POA: Diagnosis not present

## 2024-07-10 DIAGNOSIS — Z880 Allergy status to penicillin: Secondary | ICD-10-CM | POA: Diagnosis not present

## 2024-07-10 DIAGNOSIS — E782 Mixed hyperlipidemia: Secondary | ICD-10-CM | POA: Diagnosis not present

## 2024-07-10 DIAGNOSIS — I10 Essential (primary) hypertension: Secondary | ICD-10-CM | POA: Diagnosis not present

## 2024-07-10 DIAGNOSIS — Z7982 Long term (current) use of aspirin: Secondary | ICD-10-CM | POA: Diagnosis not present

## 2024-07-10 DIAGNOSIS — I214 Non-ST elevation (NSTEMI) myocardial infarction: Secondary | ICD-10-CM | POA: Diagnosis not present

## 2024-07-10 DIAGNOSIS — F32A Depression, unspecified: Secondary | ICD-10-CM | POA: Diagnosis not present

## 2024-07-10 DIAGNOSIS — N289 Disorder of kidney and ureter, unspecified: Secondary | ICD-10-CM | POA: Diagnosis not present

## 2024-07-10 DIAGNOSIS — Z79899 Other long term (current) drug therapy: Secondary | ICD-10-CM | POA: Diagnosis not present

## 2024-07-10 DIAGNOSIS — R7303 Prediabetes: Secondary | ICD-10-CM | POA: Diagnosis not present

## 2024-07-10 HISTORY — PX: LEFT HEART CATH AND CORONARY ANGIOGRAPHY: CATH118249

## 2024-07-10 HISTORY — PX: CORONARY STENT INTERVENTION: CATH118234

## 2024-07-10 LAB — CBC
HCT: 46.4 % (ref 39.0–52.0)
Hemoglobin: 15.6 g/dL (ref 13.0–17.0)
MCH: 28.9 pg (ref 26.0–34.0)
MCHC: 33.6 g/dL (ref 30.0–36.0)
MCV: 86.1 fL (ref 80.0–100.0)
Platelets: 280 K/uL (ref 150–400)
RBC: 5.39 MIL/uL (ref 4.22–5.81)
RDW: 14.6 % (ref 11.5–15.5)
WBC: 10.5 K/uL (ref 4.0–10.5)
nRBC: 0 % (ref 0.0–0.2)

## 2024-07-10 LAB — HEPARIN LEVEL (UNFRACTIONATED): Heparin Unfractionated: 0.36 [IU]/mL (ref 0.30–0.70)

## 2024-07-10 LAB — BASIC METABOLIC PANEL WITH GFR
Anion gap: 14 (ref 5–15)
BUN: 23 mg/dL — ABNORMAL HIGH (ref 6–20)
CO2: 23 mmol/L (ref 22–32)
Calcium: 8.6 mg/dL — ABNORMAL LOW (ref 8.9–10.3)
Chloride: 99 mmol/L (ref 98–111)
Creatinine, Ser: 1.28 mg/dL — ABNORMAL HIGH (ref 0.61–1.24)
GFR, Estimated: >60 mL/min (ref >60)
Glucose, Bld: 148 mg/dL — ABNORMAL HIGH (ref 70–99)
Potassium: 3.8 mmol/L (ref 3.5–5.1)
Sodium: 136 mmol/L (ref 135–145)

## 2024-07-10 LAB — POCT ACTIVATED CLOTTING TIME
Activated Clotting Time: 256 s
Activated Clotting Time: 291 s
Activated Clotting Time: 279 s
Activated Clotting Time: 291 s
Activated Clotting Time: 337 s

## 2024-07-10 SURGERY — LEFT HEART CATH AND CORONARY ANGIOGRAPHY
Anesthesia: LOCAL

## 2024-07-10 MED ORDER — CEFAZOLIN SODIUM-DEXTROSE 2-4 GM/100ML-% IV SOLN
2.0000 g | INTRAVENOUS | Status: DC
  Filled 2024-07-10: qty 100

## 2024-07-10 MED ORDER — SODIUM CHLORIDE 0.9 % IV SOLN
INTRAVENOUS | Status: AC | PRN
  Administered 2024-07-10: 10 mL/h via INTRAVENOUS

## 2024-07-10 MED ORDER — STERILE WATER FOR INJECTION IJ SOLN
INTRAMUSCULAR | Status: AC
  Filled 2024-07-10: qty 10

## 2024-07-10 MED ORDER — DEXMEDETOMIDINE HCL IN NACL 400 MCG/100ML IV SOLN
0.1000 ug/kg/h | INTRAVENOUS | Status: DC
  Filled 2024-07-10: qty 100

## 2024-07-10 MED ORDER — HEPARIN SODIUM (PORCINE) 1000 UNIT/ML IJ SOLN
INTRAMUSCULAR | Status: AC
  Filled 2024-07-10: qty 10

## 2024-07-10 MED ORDER — TICAGRELOR 90 MG PO TABS
ORAL_TABLET | ORAL | Status: DC | PRN
  Administered 2024-07-10: 180 mg via ORAL

## 2024-07-10 MED ORDER — NOREPINEPHRINE 4 MG/250ML-% IV SOLN
0.0000 ug/min | INTRAVENOUS | Status: DC
Start: 2024-07-11 — End: 2024-07-10
  Filled 2024-07-10: qty 250

## 2024-07-10 MED ORDER — SODIUM CHLORIDE 0.9% FLUSH: 3.0000 mL | INTRAVENOUS | Status: DC | PRN

## 2024-07-10 MED ORDER — NITROGLYCERIN 1 MG/10 ML FOR IR/CATH LAB
INTRA_ARTERIAL | Status: AC
  Filled 2024-07-10: qty 10

## 2024-07-10 MED ORDER — MANNITOL 20 % IV SOLN
INTRAVENOUS | Status: DC
  Filled 2024-07-10: qty 13

## 2024-07-10 MED ORDER — FENTANYL CITRATE (PF) 100 MCG/2ML IJ SOLN
INTRAMUSCULAR | Status: DC | PRN
  Administered 2024-07-10: 25 ug via INTRAVENOUS

## 2024-07-10 MED ORDER — CANGRELOR TETRASODIUM 50 MG IV SOLR
INTRAVENOUS | Status: AC
  Filled 2024-07-10: qty 50

## 2024-07-10 MED ORDER — HEPARIN SODIUM (PORCINE) 1000 UNIT/ML IJ SOLN
INTRAMUSCULAR | Status: DC | PRN
  Administered 2024-07-10: 3000 [IU] via INTRAVENOUS
  Administered 2024-07-10: 2000 [IU] via INTRAVENOUS
  Administered 2024-07-10: 5000 [IU] via INTRAVENOUS
  Administered 2024-07-10: 2000 [IU] via INTRAVENOUS
  Administered 2024-07-10: 4500 [IU] via INTRAVENOUS

## 2024-07-10 MED ORDER — ENOXAPARIN SODIUM 40 MG/0.4ML IJ SOSY
40.0000 mg | PREFILLED_SYRINGE | INTRAMUSCULAR | Status: DC
  Administered 2024-07-11: 40 mg via SUBCUTANEOUS
  Filled 2024-07-10: qty 0.4

## 2024-07-10 MED ORDER — HEPARIN (PORCINE) IN NACL 1000-0.9 UT/500ML-% IV SOLN
INTRAVENOUS | Status: DC | PRN
  Administered 2024-07-10 (×2): 500 mL

## 2024-07-10 MED ORDER — EPINEPHRINE HCL 5 MG/250ML IV SOLN IN NS
0.0000 ug/min | INTRAVENOUS | Status: DC
  Filled 2024-07-10: qty 250

## 2024-07-10 MED ORDER — SODIUM CHLORIDE 0.9 % IV SOLN
INTRAVENOUS | Status: AC | PRN
  Administered 2024-07-10: 4 ug/kg/min via INTRAVENOUS

## 2024-07-10 MED ORDER — VERAPAMIL HCL 2.5 MG/ML IV SOLN
INTRAVENOUS | Status: AC
  Filled 2024-07-10: qty 2

## 2024-07-10 MED ORDER — VERAPAMIL HCL 2.5 MG/ML IV SOLN
INTRAVENOUS | Status: DC | PRN
  Administered 2024-07-10: 10 mL via INTRA_ARTERIAL

## 2024-07-10 MED ORDER — TICAGRELOR 90 MG PO TABS
90.0000 mg | ORAL_TABLET | Freq: Two times a day (BID) | ORAL | Status: DC
Start: 2024-07-10 — End: 2024-07-11
  Administered 2024-07-11 (×2): 90 mg via ORAL
  Filled 2024-07-10 (×2): qty 1

## 2024-07-10 MED ORDER — MIDAZOLAM HCL 2 MG/2ML IJ SOLN
INTRAMUSCULAR | Status: AC
  Filled 2024-07-10: qty 2

## 2024-07-10 MED ORDER — POTASSIUM CHLORIDE 2 MEQ/ML IV SOLN
80.0000 meq | INTRAVENOUS | Status: DC
  Filled 2024-07-10: qty 40

## 2024-07-10 MED ORDER — FREE WATER
500.0000 mL | Freq: Once | Status: AC
  Administered 2024-07-10: 500 mL via ORAL

## 2024-07-10 MED ORDER — HEPARIN 30,000 UNITS/1000 ML (OHS) CELLSAVER SOLUTION
Status: DC
  Filled 2024-07-10: qty 1000

## 2024-07-10 MED ORDER — SODIUM CHLORIDE 0.9 % IV SOLN
INTRAVENOUS | Status: AC
Start: 2024-07-10 — End: 2024-07-10

## 2024-07-10 MED ORDER — TRANEXAMIC ACID (OHS) PUMP PRIME SOLUTION
2.0000 mg/kg | INTRAVENOUS | Status: DC
  Filled 2024-07-10: qty 1.86

## 2024-07-10 MED ORDER — FENTANYL CITRATE (PF) 100 MCG/2ML IJ SOLN
INTRAMUSCULAR | Status: AC
  Filled 2024-07-10: qty 2

## 2024-07-10 MED ORDER — INSULIN REGULAR(HUMAN) IN NACL 100-0.9 UT/100ML-% IV SOLN
INTRAVENOUS | Status: DC
  Filled 2024-07-10: qty 100

## 2024-07-10 MED ORDER — TRANEXAMIC ACID 1000 MG/10ML IV SOLN
1.5000 mg/kg/h | INTRAVENOUS | Status: DC
  Filled 2024-07-10: qty 25

## 2024-07-10 MED ORDER — TRANEXAMIC ACID (OHS) BOLUS VIA INFUSION
15.0000 mg/kg | INTRAVENOUS | Status: DC
  Filled 2024-07-10: qty 1395

## 2024-07-10 MED ORDER — MILRINONE LACTATE IN DEXTROSE 20-5 MG/100ML-% IV SOLN
0.3000 ug/kg/min | INTRAVENOUS | Status: DC
  Filled 2024-07-10: qty 100

## 2024-07-10 MED ORDER — HYDRALAZINE HCL 10 MG PO TABS
10.0000 mg | ORAL_TABLET | Freq: Three times a day (TID) | ORAL | Status: DC
Start: 2024-07-10 — End: 2024-07-11
  Administered 2024-07-10 – 2024-07-11 (×4): 10 mg via ORAL
  Filled 2024-07-10 (×4): qty 1

## 2024-07-10 MED ORDER — NITROGLYCERIN IN D5W 200-5 MCG/ML-% IV SOLN
2.0000 ug/min | INTRAVENOUS | Status: DC
Start: 2024-07-11 — End: 2024-07-10
  Filled 2024-07-10: qty 250

## 2024-07-10 MED ORDER — LABETALOL HCL 5 MG/ML IV SOLN
10.0000 mg | INTRAVENOUS | Status: AC | PRN
  Administered 2024-07-10: 10 mg via INTRAVENOUS
  Filled 2024-07-10: qty 4

## 2024-07-10 MED ORDER — PHENYLEPHRINE HCL-NACL 20-0.9 MG/250ML-% IV SOLN
30.0000 ug/min | INTRAVENOUS | Status: DC
  Filled 2024-07-10: qty 250

## 2024-07-10 MED ORDER — FENTANYL CITRATE (PF) 100 MCG/2ML IJ SOLN
INTRAMUSCULAR | Status: DC | PRN
  Administered 2024-07-10 (×2): 25 ug via INTRAVENOUS

## 2024-07-10 MED ORDER — TICAGRELOR 90 MG PO TABS
ORAL_TABLET | ORAL | Status: AC
  Filled 2024-07-10: qty 2

## 2024-07-10 MED ORDER — SODIUM CHLORIDE 0.9% FLUSH
3.0000 mL | Freq: Two times a day (BID) | INTRAVENOUS | Status: DC
  Administered 2024-07-11 (×2): 3 mL via INTRAVENOUS

## 2024-07-10 MED ORDER — VANCOMYCIN HCL 1.5 G IV SOLR
1500.0000 mg | INTRAVENOUS | Status: DC
  Filled 2024-07-10: qty 30

## 2024-07-10 MED ORDER — SODIUM CHLORIDE 0.9 % IV SOLN
4.0000 ug/kg/min | INTRAVENOUS | Status: AC
  Filled 2024-07-10: qty 50

## 2024-07-10 MED ORDER — HYDRALAZINE HCL 20 MG/ML IJ SOLN
10.0000 mg | INTRAMUSCULAR | Status: AC | PRN
  Administered 2024-07-10: 10 mg via INTRAVENOUS
  Filled 2024-07-10: qty 1

## 2024-07-10 MED ORDER — NITROGLYCERIN 1 MG/10 ML FOR IR/CATH LAB
INTRA_ARTERIAL | Status: DC | PRN
  Administered 2024-07-10 (×3): 200 ug via INTRACORONARY

## 2024-07-10 MED ORDER — IOHEXOL 350 MG/ML SOLN
INTRAVENOUS | Status: DC | PRN
Start: 2024-07-10 — End: 2024-07-10
  Administered 2024-07-10: 240 mL

## 2024-07-10 MED ORDER — SODIUM CHLORIDE 0.9 % IV SOLN: 250.0000 mL | INTRAVENOUS | Status: DC | PRN

## 2024-07-10 MED ORDER — PLASMA-LYTE A IV SOLN
INTRAVENOUS | Status: DC
  Filled 2024-07-10: qty 2.5

## 2024-07-10 MED ORDER — CANGRELOR BOLUS VIA INFUSION
INTRAVENOUS | Status: DC | PRN
  Administered 2024-07-10: 2790 ug via INTRAVENOUS

## 2024-07-10 MED ORDER — LIDOCAINE HCL (PF) 1 % IJ SOLN
INTRAMUSCULAR | Status: DC | PRN
  Administered 2024-07-10: 2 mL via INTRADERMAL

## 2024-07-10 MED ORDER — MIDAZOLAM HCL (PF) 2 MG/2ML IJ SOLN
INTRAMUSCULAR | Status: DC | PRN
  Administered 2024-07-10: 2 mg via INTRAVENOUS
  Administered 2024-07-10: 1 mg via INTRAVENOUS

## 2024-07-10 SURGICAL SUPPLY — 22 items
BALLOON EMERGE MR 2.5X12 (BALLOONS) IMPLANT
BALLOON EMERGE MR 2.75X15 (BALLOONS) IMPLANT
BALLOON SAPPHIRE NC24 3.5X12 (BALLOONS) IMPLANT
BALLOON ~~LOC~~ EMERGE MR 2.25X12 (BALLOONS) IMPLANT
BALLOON ~~LOC~~ EMERGE MR 3.25X20 (BALLOONS) IMPLANT
BALLOON ~~LOC~~ EMERGE MR 3.5X12 (BALLOONS) IMPLANT
CATH 5FR JL3.5 JR4 ANG PIG MP (CATHETERS) IMPLANT
CATH VISTA GUIDE 6FR XBLD 3.5 (CATHETERS) IMPLANT
DEVICE RAD COMP TR BAND LRG (VASCULAR PRODUCTS) IMPLANT
ELECT DEFIB PAD ADLT CADENCE (PAD) IMPLANT
GLIDESHEATH SLEND SS 6F .021 (SHEATH) IMPLANT
GUIDEWIRE INQWIRE 1.5J.035X260 (WIRE) IMPLANT
KIT ENCORE 26 ADVANTAGE (KITS) IMPLANT
KIT ESSENTIALS PG (KITS) IMPLANT
PACK CARDIAC CATHETERIZATION (CUSTOM PROCEDURE TRAY) ×2 IMPLANT
SET ATX-X65L (MISCELLANEOUS) IMPLANT
SHEATH PROBE COVER 6X72 (BAG) IMPLANT
STENT ONYX FRONTIER 2.25X12 (Permanent Stent) IMPLANT
STENT ONYX FRONTIER 3.0X34 (Permanent Stent) IMPLANT
WIRE HI TORQ BMW 190CM (WIRE) IMPLANT
WIRE PT2 MS 185 (WIRE) IMPLANT
WIRE RUNTHROUGH IZANAI 014 180 (WIRE) IMPLANT

## 2024-07-10 NOTE — Progress Notes (Addendum)
 Progress Note  Patient Name: Levi Johnston Date of Encounter: 07/10/2024 [facility] Cardiologist: None   Interval Summary   Transferred from ED to [facility]. Some chest discomfort when rearranging furniture in his hospital room. Mild chest pain at rest. No acute overnight events.   Vital Signs Vitals:   07/09/24 2100 07/10/24 0100 07/10/24 0400 07/10/24 0737  BP: 105/61 (!) 143/84 129/79 (!) 115/97  Pulse: 97 86 88 87  Resp: 20 20 18 20   Temp: 98.3 F (36.8 C) 98 F (36.7 C) 97.8 F (36.6 C) 97.8 F (36.6 C)  TempSrc: Oral Oral Oral Oral  SpO2: 96% 94% 98% 96%  Weight:      Height:        Intake/Output Summary (Last 24 hours) at 07/10/2024 1121 Last data filed at 07/10/2024 0900 Gross per 24 hour  Intake 480 ml  Output --  Net 480 ml      07/08/2024    5:20 PM 07/08/2024    3:54 PM 06/06/2024    1:39 PM  Last 3 Weights  Weight (lbs) 205 lb 208 lb 5.4 oz 208 lb 6.4 oz  Weight (kg) 92.987 kg 94.5 kg 94.53 kg      Telemetry/ECG  SR/ST - Personally Reviewed  Physical Exam  General: Well developed, in no acute distress.  Neck: No JVD.  Cardiac: Normal rate, regular rhythm.  Resp: Normal work of breathing.  Ext: No edema.  Neuro: No gross focal deficits.  Psych: Normal affect.   Echo 07/09/24:   1. Left ventricular ejection fraction, by estimation, is 60 to 65%. The  left ventricle has normal function. The left ventricle demonstrates  regional wall motion abnormalities (see scoring diagram/findings for  description). There is mild asymmetric left  ventricular hypertrophy of the septal segment. Left ventricular diastolic  parameters are consistent with Grade I diastolic dysfunction (impaired  relaxation).   2. Right ventricular systolic function is normal. The right ventricular  size is normal. Tricuspid regurgitation signal is inadequate for assessing  PA pressure.   3. Mild chordal SAM. The mitral valve is normal in structure. Trivial  mitral valve  regurgitation. No evidence of mitral stenosis.   4. The aortic valve is normal in structure. Aortic valve regurgitation is  not visualized. No aortic stenosis is present.   5. The inferior vena cava is dilated in size with <50% respiratory  variability, suggesting right atrial pressure of 15 mmHg.   Assessment & Plan  #. NSTEMI, type 1 - Echo with normal LVEF, WMAs - mid and distal anterior septum and mid inferoseptal segment are  hypokinetic.  - Aspirin 81 mg daily  - Heparin infusion - Rosuvastatin 40mg  daily - Repeat ECG this morning with Wellen's pattern T waves in the precordial leads. Given ongoing chest discomfort, will send to cath lab today. Discussed with interventional cardiologist on call. Risks and benefits of cardiac catheterization have been discussed. Patient voiced understanding and elected to proceed.    #. Hypertension: Recently diagnosed. On combination olmesartan -hydrochlorothiazide. Given Cr 1.28, will hold off on ARB for now and treat with some hydralazine. - Continue Hydralazine 10 mg q8hrs started in ED.   #. Hyperlipidemia: LDL 166. Tchol 242. HDL 41. Not on lipid lowering therapy. - Rosuvastatin 40 mg daily   #. Tobacco use disorder - Nicotine  patch if needed   #. 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 Will avoid ARB until Cr stabilizes, especially in the setting of likely coronary angiogram this admission.  Signed,  Fonda Kitty, MD

## 2024-07-10 NOTE — H&P (View-Only) (Signed)
 Progress Note  Patient Name: Levi Johnston Date of Encounter: 07/10/2024 [facility] Cardiologist: None   Interval Summary   Transferred from ED to [facility]. Some chest discomfort when rearranging furniture in his hospital room. Mild chest pain at rest. No acute overnight events.   Vital Signs Vitals:   07/09/24 2100 07/10/24 0100 07/10/24 0400 07/10/24 0737  BP: 105/61 (!) 143/84 129/79 (!) 115/97  Pulse: 97 86 88 87  Resp: 20 20 18 20   Temp: 98.3 F (36.8 C) 98 F (36.7 C) 97.8 F (36.6 C) 97.8 F (36.6 C)  TempSrc: Oral Oral Oral Oral  SpO2: 96% 94% 98% 96%  Weight:      Height:        Intake/Output Summary (Last 24 hours) at 07/10/2024 1121 Last data filed at 07/10/2024 0900 Gross per 24 hour  Intake 480 ml  Output --  Net 480 ml      07/08/2024    5:20 PM 07/08/2024    3:54 PM 06/06/2024    1:39 PM  Last 3 Weights  Weight (lbs) 205 lb 208 lb 5.4 oz 208 lb 6.4 oz  Weight (kg) 92.987 kg 94.5 kg 94.53 kg      Telemetry/ECG  SR/ST - Personally Reviewed  Physical Exam  General: Well developed, in no acute distress.  Neck: No JVD.  Cardiac: Normal rate, regular rhythm.  Resp: Normal work of breathing.  Ext: No edema.  Neuro: No gross focal deficits.  Psych: Normal affect.   Echo 07/09/24:   1. Left ventricular ejection fraction, by estimation, is 60 to 65%. The  left ventricle has normal function. The left ventricle demonstrates  regional wall motion abnormalities (see scoring diagram/findings for  description). There is mild asymmetric left  ventricular hypertrophy of the septal segment. Left ventricular diastolic  parameters are consistent with Grade I diastolic dysfunction (impaired  relaxation).   2. Right ventricular systolic function is normal. The right ventricular  size is normal. Tricuspid regurgitation signal is inadequate for assessing  PA pressure.   3. Mild chordal SAM. The mitral valve is normal in structure. Trivial  mitral valve  regurgitation. No evidence of mitral stenosis.   4. The aortic valve is normal in structure. Aortic valve regurgitation is  not visualized. No aortic stenosis is present.   5. The inferior vena cava is dilated in size with <50% respiratory  variability, suggesting right atrial pressure of 15 mmHg.   Assessment & Plan  #. NSTEMI, type 1 - Echo with normal LVEF, WMAs - mid and distal anterior septum and mid inferoseptal segment are  hypokinetic.  - Aspirin 81 mg daily  - Heparin infusion - Rosuvastatin 40mg  daily - Repeat ECG this morning with Wellen's pattern T waves in the precordial leads. Given ongoing chest discomfort, will send to cath lab today. Discussed with interventional cardiologist on call. Risks and benefits of cardiac catheterization have been discussed. Patient voiced understanding and elected to proceed.    #. Hypertension: Recently diagnosed. On combination olmesartan -hydrochlorothiazide. Given Cr 1.28, will hold off on ARB for now and treat with some hydralazine. - Continue Hydralazine 10 mg q8hrs started in ED.   #. Hyperlipidemia: LDL 166. Tchol 242. HDL 41. Not on lipid lowering therapy. - Rosuvastatin 40 mg daily   #. Tobacco use disorder - Nicotine  patch if needed   #. 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 Will avoid ARB until Cr stabilizes, especially in the setting of likely coronary angiogram this admission.  Signed,  Fonda Kitty, MD

## 2024-07-10 NOTE — Progress Notes (Signed)
 ANTICOAGULATION CONSULT NOTE  Pharmacy Consult for Heparin Indication: chest pain/ACS  Allergies  Allergen Reactions   Penicillins Other (See Comments)    Unknown childhood reaction  Has patient had a PCN reaction causing immediate rash, facial/tongue/throat swelling, SOB or lightheadedness with hypotension: Unknown  Has patient had a PCN reaction causing severe rash involving mucus membranes or skin necrosis: Unknown  Has patient had a PCN reaction that required hospitalization: Unknown  Has patient had a PCN reaction occurring within the last 10 years: Unknown  If all of the above answers are NO, then may proceed with Cephalosporin use.  Other Reaction(s): Unknown  Unknown childhood reaction, Has patient had a PCN reaction causing immediate rash, facial/tongue/throat swelling, SOB or lightheadedness with hypotension: Unknown, Has patient had a PCN reaction causing severe rash involving mucus membranes or skin necrosis: Unknown, Has patient had a PCN reaction that required hospitalization: Unknown, Has patient had a PCN reaction occurring within the last 10 years: Unknown, If all of the above answers are NO, then may proceed with Cephalosporin use.    Patient Measurements: Height: 6' (182.9 cm) Weight: 93 kg (205 lb) IBW/kg (Calculated) : 77.6 Heparin Dosing Weight: 93 kg  Vital Signs: Temp: 97.8 F (36.6 C) (10/26 0737) Temp Source: Oral (10/26 0737) BP: 115/97 (10/26 0737) Pulse Rate: 87 (10/26 0737)  Labs: Recent Labs    07/08/24 1912 07/08/24 1912 07/08/24 2128 07/09/24 0500 07/09/24 1300 07/09/24 2039 07/10/24 0139  HGB 17.9*  --   --  16.5  --   --  15.6  HCT 53.6*  --   --  48.1  --   --  46.4  PLT 319  --   --  280  --   --  280  HEPARINUNFRC  --    < >  --  <0.10* 0.28* 0.35 0.36  CREATININE 1.47*  --   --  1.37*  --   --  1.28*  TROPONINIHS 594*  --  805*  --   --   --   --    < > = values in this interval not displayed.    Estimated Creatinine  Clearance: 69.9 mL/min (A) (by C-G formula based on SCr of 1.28 mg/dL (H)).   Medical History: Past Medical History:  Diagnosis Date   Anxiety    Depression    Hypertension     Medications:  Medications Prior to Admission  Medication Sig Dispense Refill Last Dose/Taking   olmesartan -hydrochlorothiazide (BENICAR  HCT) 20-12.5 MG tablet Take 1 tablet by mouth daily. 90 tablet 3 07/08/2024   nicotine  (NICODERM CQ  - DOSED IN MG/24 HOURS) 14 mg/24hr patch Place 1 patch (14 mg total) onto the skin daily. 28 patch 1 Unknown   nicotine  polacrilex (COMMIT) 2 MG lozenge Take 1 lozenge (2 mg total) by mouth as needed for smoking cessation. 100 tablet 1 Unknown   sildenafil  (VIAGRA ) 50 MG tablet Take 0.5-1 tablets (25-50 mg total) by mouth daily as needed for erectile dysfunction. 10 tablet 1 More than a month   Scheduled:   aspirin EC  81 mg Oral Daily   hydrALAZINE  25 mg Oral Q8H   nicotine   14 mg Transdermal Daily   rosuvastatin  40 mg Oral Daily   Infusions:   heparin 1,750 Units/hr (07/10/24 0228)   PRN: acetaminophen , nitroGLYCERIN, ondansetron  (ZOFRAN ) IV  Assessment: 58 yom with a history of HTN, tobacco use. Patient is presenting with chest pain. Heparin per pharmacy consult placed for chest pain/ACS.  Patient  was not on anticoagulation prior to arrival.  Heparin level remains therapeutic on 1750 units/hr  Goal of Therapy:  Heparin level 0.3-0.7 units/ml Monitor platelets by anticoagulation protocol: Yes   Plan:  Continue heparin 1750 units/h Daily heparin level and CBC  R. Samual Satterfield, PharmD PGY-1 Acute Care Pharmacy Resident [facility] Please refer to [facility] for [facility] numbers 07/10/2024 8:06 AM

## 2024-07-10 NOTE — Interval H&P Note (Signed)
 History and Physical Interval Note:  07/10/2024 2:05 PM  Levi Johnston  has presented today for surgery, with the diagnosis of NSTEMI.  The various methods of treatment have been discussed with the patient and family. After consideration of risks, benefits and other options for treatment, the patient has consented to  Procedure(s): LEFT HEART CATH AND CORONARY ANGIOGRAPHY (N/A) as a surgical intervention.  The patient's history has been reviewed, patient examined, no change in status, stable for surgery.  I have reviewed the patient's chart and labs.  Questions were answered to the patient's satisfaction.    Cath Lab Visit (complete for each Cath Lab visit)  Clinical Evaluation Leading to the Procedure:   ACS: Yes.    Non-ACS:  N/A  [name]

## 2024-07-11 ENCOUNTER — Other Ambulatory Visit (HOSPITAL_COMMUNITY): Payer: Self-pay

## 2024-07-11 ENCOUNTER — Telehealth (HOSPITAL_COMMUNITY): Payer: Self-pay

## 2024-07-11 ENCOUNTER — Other Ambulatory Visit: Payer: Self-pay

## 2024-07-11 DIAGNOSIS — I10 Essential (primary) hypertension: Secondary | ICD-10-CM | POA: Diagnosis not present

## 2024-07-11 DIAGNOSIS — Z7982 Long term (current) use of aspirin: Secondary | ICD-10-CM | POA: Diagnosis not present

## 2024-07-11 DIAGNOSIS — Z880 Allergy status to penicillin: Secondary | ICD-10-CM | POA: Diagnosis not present

## 2024-07-11 DIAGNOSIS — I2510 Atherosclerotic heart disease of native coronary artery without angina pectoris: Secondary | ICD-10-CM | POA: Diagnosis not present

## 2024-07-11 DIAGNOSIS — R7303 Prediabetes: Secondary | ICD-10-CM | POA: Diagnosis not present

## 2024-07-11 DIAGNOSIS — E782 Mixed hyperlipidemia: Secondary | ICD-10-CM | POA: Diagnosis not present

## 2024-07-11 DIAGNOSIS — I214 Non-ST elevation (NSTEMI) myocardial infarction: Secondary | ICD-10-CM | POA: Diagnosis not present

## 2024-07-11 DIAGNOSIS — Z833 Family history of diabetes mellitus: Secondary | ICD-10-CM | POA: Diagnosis not present

## 2024-07-11 DIAGNOSIS — F17210 Nicotine dependence, cigarettes, uncomplicated: Secondary | ICD-10-CM | POA: Diagnosis not present

## 2024-07-11 DIAGNOSIS — F32A Depression, unspecified: Secondary | ICD-10-CM | POA: Diagnosis not present

## 2024-07-11 DIAGNOSIS — N289 Disorder of kidney and ureter, unspecified: Secondary | ICD-10-CM | POA: Diagnosis not present

## 2024-07-11 LAB — BASIC METABOLIC PANEL WITH GFR
Anion gap: 10 (ref 5–15)
BUN: 14 mg/dL (ref 6–20)
CO2: 25 mmol/L (ref 22–32)
Calcium: 9 mg/dL (ref 8.9–10.3)
Chloride: 103 mmol/L (ref 98–111)
Creatinine, Ser: 1.21 mg/dL (ref 0.61–1.24)
GFR, Estimated: >60 mL/min (ref >60)
Glucose, Bld: 171 mg/dL — ABNORMAL HIGH (ref 70–99)
Potassium: 3.7 mmol/L (ref 3.5–5.1)
Sodium: 138 mmol/L (ref 135–145)

## 2024-07-11 LAB — CBC
HCT: 42.9 % (ref 39.0–52.0)
Hemoglobin: 14.8 g/dL (ref 13.0–17.0)
MCH: 29.5 pg (ref 26.0–34.0)
MCHC: 34.5 g/dL (ref 30.0–36.0)
MCV: 85.6 fL (ref 80.0–100.0)
Platelets: 259 K/uL (ref 150–400)
RBC: 5.01 MIL/uL (ref 4.22–5.81)
RDW: 14.6 % (ref 11.5–15.5)
WBC: 10.1 K/uL (ref 4.0–10.5)
nRBC: 0 % (ref 0.0–0.2)

## 2024-07-11 LAB — LIPOPROTEIN A (LPA): Lipoprotein (a): 32.7 nmol/L — ABNORMAL HIGH (ref <75.0)

## 2024-07-11 SURGERY — OFF PUMP CORONARY ARTERY BYPASS GRAFTING (CABG)
Anesthesia: General | Site: Chest

## 2024-07-11 MED ORDER — ASPIRIN 81 MG PO TBEC
81.0000 mg | DELAYED_RELEASE_TABLET | Freq: Every day | ORAL | 3 refills | Status: DC
  Filled 2024-07-11: qty 90, 90d supply, fill #0

## 2024-07-11 MED ORDER — ROSUVASTATIN CALCIUM 40 MG PO TABS
40.0000 mg | ORAL_TABLET | Freq: Every day | ORAL | 3 refills | Status: DC
  Filled 2024-07-11: qty 90, 90d supply, fill #0

## 2024-07-11 MED ORDER — TICAGRELOR 90 MG PO TABS
90.0000 mg | ORAL_TABLET | Freq: Two times a day (BID) | ORAL | 11 refills | Status: DC
  Filled 2024-07-11: qty 60, 30d supply, fill #0

## 2024-07-11 MED ORDER — NITROGLYCERIN 0.4 MG SL SUBL
0.4000 mg | SUBLINGUAL_TABLET | SUBLINGUAL | 2 refills | Status: AC | PRN
  Filled 2024-07-11: qty 25, 5d supply, fill #0

## 2024-07-11 MED ORDER — NICOTINE 14 MG/24HR TD PT24
14.0000 mg | MEDICATED_PATCH | Freq: Every day | TRANSDERMAL | 1 refills | Status: AC
  Filled 2024-07-11: qty 28, 28d supply, fill #0

## 2024-07-11 MED ORDER — METOPROLOL SUCCINATE ER 25 MG PO TB24: 25.0000 mg | ORAL_TABLET | Freq: Every day | ORAL | Status: DC

## 2024-07-11 NOTE — Discharge Instructions (Addendum)
 Medication Changes: - START Aspirin 81mg  once daily and Brilinta 90mg  twice daily. These medications are very important in helping keep the new stents in your heart open. - START Rosuvastatin (Crestor) 40mg  once daily. This is for cholesterol. _______________  Post NSTEMI: NO HEAVY LIFTING X 2 WEEKS. NO SEXUAL ACTIVITY X 2 WEEKS. NO DRIVING X 1 WEEK. NO SOAKING BATHS, HOT TUBS, POOLS, ETC., X 7 DAYS.  Radial Site Care Refer to this sheet in the next few weeks. These instructions provide you with information on caring for yourself after your procedure. Your caregiver may also give you more specific instructions. Your treatment has been planned according to current medical practices, but problems sometimes occur. Call your caregiver if you have any problems or questions after your procedure. HOME CARE INSTRUCTIONS You may shower the day after the procedure. Remove the bandage (dressing) and gently wash the site with plain soap and water. Gently pat the site dry.  Do not apply powder or lotion to the site.  Do not submerge the affected site in water for 3 to 5 days.  Inspect the site at least twice daily.  Do not flex or bend the affected arm for 24 hours.  No lifting over 5 pounds (2.3 kg) for 5 days after your procedure.  Do not drive home if you are discharged the same day of the procedure. Have someone else drive you.  What to expect: Any bruising will usually fade within 1 to 2 weeks.  Blood that collects in the tissue (hematoma) may be painful to the touch. It should usually decrease in size and tenderness within 1 to 2 weeks.  SEEK IMMEDIATE MEDICAL CARE IF: You have unusual pain at the radial site.  You have redness, warmth, swelling, or pain at the radial site.  You have drainage (other than a small amount of blood on the dressing).  You have chills.  You have a fever or persistent symptoms for more than 72 hours.  You have a fever and your symptoms suddenly get worse.  Your arm  becomes pale, cool, tingly, or numb.  You have heavy bleeding from the site. Hold pressure on the site.  _______________   Information about your medication: Brilinta (anti-platelet agent)  Generic Name (Brand): ticagrelor (Brilinta), twice daily medication  PURPOSE: You are taking this medication along with aspirin to lower your chance of having a heart attack, stroke, or blood clots in your heart stent. These can be fatal. Brilinta and aspirin help prevent platelets from sticking together and forming a clot that can block an artery or your stent.   Common SIDE EFFECTS you may experience include: bruising or bleeding more easily, shortness of breath  Do not stop taking BRILINTA without talking to the doctor who prescribes it for you. People who are treated with a stent and stop taking Brilinta too soon, have a higher risk of getting a blood clot in the stent, having a heart attack, or dying. If you stop Brilinta because of bleeding, or for other reasons, your risk of a heart attack or stroke may increase.   Avoid taking NSAID agents or anti-inflammatory medications such as ibuprofen , naproxen given increased bleed risk with Brilinta - can use acetaminophen  (Tylenol ) if needed for pain.  Tell all of your doctors and dentists that you are taking Brilinta. They should talk to the doctor who prescribed Brilinta for you before you have any surgery or invasive procedure.   Contact your health care provider if you experience: severe  or uncontrollable bleeding, pink/red/brown urine, vomiting blood or vomit that looks like coffee grounds, red or black stools (looks like tar), coughing up blood or blood clots ----------------------------------------------------------------------------------------------------------------------

## 2024-07-11 NOTE — Discharge Summary (Addendum)
 Discharge Summary   Patient ID: Levi Johnston MRN: [id number]; DOB: 1966/09/01  Admit date: 07/08/2024 Discharge date: 07/11/2024  PCP:  Chandra Toribio POUR, MD   [facility] Providers Cardiologist:  Lonni Cash, MD       Discharge Diagnoses  Principal Problem:   NSTEMI (non-ST elevated myocardial infarction) [facility]) Active Problems:   CAD (coronary artery disease)   Mixed hyperlipidemia   Hypertension   Prediabetes   Tobacco use disorder, continuous   Diagnostic Studies/Procedures   Echocardiogram 07/09/2024: Impressions: 1. Left ventricular ejection fraction, by estimation, is 60 to 65%. The  left ventricle has normal function. The left ventricle demonstrates  regional wall motion abnormalities (see scoring diagram/findings for  description). There is mild asymmetric left  ventricular hypertrophy of the septal segment. Left ventricular diastolic  parameters are consistent with Grade I diastolic dysfunction (impaired  relaxation).   2. Right ventricular systolic function is normal. The right ventricular  size is normal. Tricuspid regurgitation signal is inadequate for assessing  PA pressure.   3. Mild chordal SAM. The mitral valve is normal in structure. Trivial  mitral valve regurgitation. No evidence of mitral stenosis.   4. The aortic valve is normal in structure. Aortic valve regurgitation is  not visualized. No aortic stenosis is present.   5. The inferior vena cava is dilated in size with <50% respiratory  variability, suggesting right atrial pressure of 15 mmHg.  _______________  Left Cardiac Catheterization 07/10/2024: Conclusions: Severe single-vessel coronary artery disease with 95% mid LAD stenosis involving moderate-caliber D2 branch with acute angulation.  There is also 50% proximal LAD disease as well as 30% OM2 and 40% RCA stenoses. Mildly elevated left ventricular filling pressure (LVEDP 20 mmHg). Complex PCI to proximal/mid LAD and D2  using crush technique (Onyx Frontier 3.0 x 34 mm LAD and 2.25 x 12 mm D2 drug-eluting stents) with 0% residual stenosis in the LAD and less than 10% residual stenosis in ostium of D2 with TIMI-3 flow throughout the left coronary artery.   Recommendations: Continue cangrelor infusion for 2 hours following administration of ticagrelor at the end of the intervention. Dual antiplatelet therapy with aspirin and ticagrelor for at least 12 months. Aggressive secondary prevention of coronary artery disease, including high intensity statin therapy and smoking cessation. Diagnostic Dominance: Right  Intervention        History of Present Illness   Levi Johnston is a 58 y.o. male with a history of hypertension, prediabetes, and tobacco use who was admitted on 07/08/2024 for NSTEMI after presenting with chest pain with associated shortness of breath and sweating.  Hospital Course   Consultants: None   NSTEMI CAD Patient presented with chest pain with associated shortness of breath and sweating. EKG showed normal sinus rhythm with biphasic T waves in leads V4-V6. High-sensitivity troponin eleated at 594 >> 805. Echo showed LVEF of 60-65% with hypokinesis of the mid and distal anterior septum and mid inferoseptal segment, normal RV function, and mild chordal SAM with only trivial MR. He had continued chest pain and evolving T wave changes so went for urgent cath on Sunday 10/26 which showed 95% stenosis of mid LAD and 70% stenosis of ostial D2 as well as mild disease of OM2 and RCA. He underwent complex PCI with DES to LAD and DES to D2. He tolerated the procedure well and denied any recurrent chest pain. He was started on DAPT with Aspirin 81mg  and Brilinta 90mg  twice daily as well as Crestor 40mg  daily. He reported very  brief transient shortness of breath on morning of discharge that sounds consistent with Brilinta but this was not overly bothersome to him. Okay to continue.  Hypertension BP mildly  elevated. Home Olmesartan -HCTZ was held on admission due to mild renal insufficiency. However, renal function improved to baseline on day of discharge. Will resume Olmesartan -HCTZ 20-12.5mg  daily. Will need repeat BMET at follow-up visit.   Hyperlipidemia Lipid panel this admission: Total Cholesterol 242, Triglycerides 175, HDL 41, LDL 166. Lipoprotein (a) 32.7. LDL goal <70 given CAD. He was started on Crestor 40mg  daily. Will repeat lipid panel and LFTs in 6-8 weeks.   Prediabetes Hemoglobin A1c 6.1% this admission. Can focus on lifestyle modifications for now. Follow-up with PCP.  Tobacco Abuse Patient currently smokes 1.5 packs per day. We discussed the importance of complete cessation. He seems very motivated to quit but is worried he will fail. Will provide Nicotine  patches at discharge.  Patient seen and examined by Dr. Verlin today and felt to be stable for discharge. Outpatient follow-up arranged. Medications as below.   Did the patient have an acute coronary syndrome (MI, NSTEMI, STEMI, etc) this admission?:  Yes                               AHA/ACC ACS Clinical Performance & Quality Measures: Aspirin prescribed? - Yes ADP Receptor Inhibitor (Plavix/Clopidogrel, Brilinta/Ticagrelor or Effient/Prasugrel) prescribed (includes medically managed patients)? - Yes Beta Blocker prescribed? - No - EF normal and no ectopy on telemetry. High Intensity Statin (Lipitor 40-80mg  or Crestor 20-40mg ) prescribed? - Yes EF assessed during THIS hospitalization? - Yes For EF <40%, was ACEI/ARB prescribed? - Not Applicable (EF >/= 40%) For EF <40%, Aldosterone Antagonist (Spironolactone or Eplerenone) prescribed? - Not Applicable (EF >/= 40%) Cardiac Rehab Phase II ordered (including medically managed patients)? - Yes   _____________  Discharge Vitals Blood pressure (!) 144/86, pulse 97, temperature 98.8 F (37.1 C), temperature source Oral, resp. rate 17, height 6' (1.829 m), weight 93 kg,  SpO2 97%.  Filed Weights   07/08/24 1720  Weight: 93 kg   General: 58 y.o. Caucasian male resting comfortably in no acute distress. Neck: Supple. No JVD. Heart: RRR. Soft II/VI systolic murmur noted. Right radial cath site slightly tender to the touch but soft with no signs of hematoma. Right radial pulse 2+. Lungs: No increased work of breathing. Clear to ausculation bilaterally. No wheezes, rhonchi, or rales.  Extremities: No lower extremity edema.    Skin: Warm and dry. Neuro: No focal deficits. Psych: Normal affect. Responds appropriately.   Labs & Radiologic Studies  CBC Recent Labs    07/08/24 1912 07/09/24 0500 07/10/24 0139 07/11/24 0412  WBC 12.9*   < > 10.5 10.1  NEUTROABS 8.2*  --   --   --   HGB 17.9*   < > 15.6 14.8  HCT 53.6*   < > 46.4 42.9  MCV 87.2   < > 86.1 85.6  PLT 319   < > 280 259   < > = values in this interval not displayed.   Basic Metabolic Panel Recent Labs    89/73/74 0139 07/11/24 0412  NA 136 138  K 3.8 3.7  CL 99 103  CO2 23 25  GLUCOSE 148* 171*  BUN 23* 14  CREATININE 1.28* 1.21  CALCIUM 8.6* 9.0   Liver Function Tests Recent Labs    07/08/24 1912  AST 37  ALT 50*  ALKPHOS 56  BILITOT 1.0  PROT 8.1  ALBUMIN 4.2   No results for input(s): LIPASE, AMYLASE in the last 72 hours. High Sensitivity Troponin:   Recent Labs  Lab 07/08/24 1912 07/08/24 2128  TROPONINIHS 594* 805*    No results for input(s): TRNPT in the last 720 hours.  BNP Invalid input(s): POCBNP No results for input(s): PROBNP in the last 72 hours.  No results for input(s): BNP in the last 72 hours.  D-Dimer No results for input(s): DDIMER in the last 72 hours. Hemoglobin A1C Recent Labs    07/09/24 0500  HGBA1C 6.1*   Fasting Lipid Panel Recent Labs    07/08/24 2128  CHOL 242*  HDL 41  LDLCALC 166*  TRIG 175*  CHOLHDL 5.9   Lipoprotein (a)  Date/Time Value Ref Range Status  07/09/2024 05:00 AM 32.7 (H) <75.0 nmol/L Final     Comment:    (NOTE) This test was developed and its performance characteristics determined by [facility]. It has not been cleared or approved by the Food and Drug Administration. Note:  Values greater than or equal to 75.0 nmol/L may       indicate an independent risk factor for CHD,       but must be evaluated with caution when applied       to non-Caucasian populations due to the       influence of genetic factors on Lp(a) across       ethnicities. Performed At: [facility] [address] [place], [place] [id number] Jennette Shorter MD Ey:[id number]     Thyroid  Function Tests No results for input(s): TSH, T4TOTAL, T3FREE, THYROIDAB in the last 72 hours.  Invalid input(s): FREET3 _____________  CARDIAC CATHETERIZATION Result Date: 07/10/2024 Conclusions: Severe single-vessel coronary artery disease with 95% mid LAD stenosis involving moderate-caliber D2 branch with acute angulation.  There is also 50% proximal LAD disease as well as 30% OM2 and 40% RCA stenoses. Mildly elevated left ventricular filling pressure (LVEDP 20 mmHg). Complex PCI to proximal/mid LAD and D2 using crush technique (Onyx Frontier 3.0 x 34 mm LAD and 2.25 x 12 mm D2 drug-eluting stents) with 0% residual stenosis in the LAD and less than 10% residual stenosis in ostium of D2 with TIMI-3 flow throughout the left coronary artery. Recommendations: Continue cangrelor infusion for 2 hours following administration of ticagrelor at the end of the intervention. Dual antiplatelet therapy with aspirin and ticagrelor for at least 12 months. Aggressive secondary prevention of coronary artery disease, including high intensity statin therapy and smoking cessation. Lonni Hanson, MD [facility]  ECHOCARDIOGRAM COMPLETE Result Date: 07/09/2024    ECHOCARDIOGRAM REPORT   Patient Name:   Levi Johnston Date of Exam: 07/09/2024 Medical Rec #:  [id number]     Height:       72.0 in Accession #:    [id number]    Weight:        205.0 lb Date of Birth:  1966/04/10     BSA:          2.153 m Patient Age:    58 years      BP:           138/93 mmHg Patient Gender: M             HR:           78 bpm. Exam Location:  Inpatient Procedure: 2D Echo (Both Spectral and Color Flow Doppler were utilized during  procedure). Indications:    acute MI  History:        Patient has no prior history of Echocardiogram examinations.                 Risk Factors:Dyslipidemia and Current Smoker.  Sonographer:    Tinnie Barefoot RDCS Referring Phys: [id number] VINCENT A DELGADO  Sonographer Comments: Global longitudinal strain was attempted. IMPRESSIONS  1. Left ventricular ejection fraction, by estimation, is 60 to 65%. The left ventricle has normal function. The left ventricle demonstrates regional wall motion abnormalities (see scoring diagram/findings for description). There is mild asymmetric left ventricular hypertrophy of the septal segment. Left ventricular diastolic parameters are consistent with Grade I diastolic dysfunction (impaired relaxation).  2. Right ventricular systolic function is normal. The right ventricular size is normal. Tricuspid regurgitation signal is inadequate for assessing PA pressure.  3. Mild chordal SAM. The mitral valve is normal in structure. Trivial mitral valve regurgitation. No evidence of mitral stenosis.  4. The aortic valve is normal in structure. Aortic valve regurgitation is not visualized. No aortic stenosis is present.  5. The inferior vena cava is dilated in size with <50% respiratory variability, suggesting right atrial pressure of 15 mmHg. FINDINGS  Left Ventricle: Left ventricular ejection fraction, by estimation, is 60 to 65%. The left ventricle has normal function. The left ventricle demonstrates regional wall motion abnormalities. The left ventricular internal cavity size was normal in size. There is mild asymmetric left ventricular hypertrophy of the septal segment. Left ventricular diastolic  parameters are consistent with Grade I diastolic dysfunction (impaired relaxation).  LV Wall Scoring: The mid and distal anterior septum and mid inferoseptal segment are hypokinetic. Right Ventricle: The right ventricular size is normal. No increase in right ventricular wall thickness. Right ventricular systolic function is normal. Tricuspid regurgitation signal is inadequate for assessing PA pressure. Left Atrium: Left atrial size was normal in size. Right Atrium: Right atrial size was normal in size. Pericardium: Trivial pericardial effusion is present. Mitral Valve: Mild chordal SAM. The mitral valve is normal in structure. Trivial mitral valve regurgitation. No evidence of mitral valve stenosis. Tricuspid Valve: The tricuspid valve is normal in structure. Tricuspid valve regurgitation is trivial. No evidence of tricuspid stenosis. Aortic Valve: The aortic valve is normal in structure. Aortic valve regurgitation is not visualized. No aortic stenosis is present. Pulmonic Valve: The pulmonic valve was normal in structure. Pulmonic valve regurgitation is not visualized. No evidence of pulmonic stenosis. Aorta: The aortic root is normal in size and structure. Venous: The inferior vena cava is dilated in size with less than 50% respiratory variability, suggesting right atrial pressure of 15 mmHg. IAS/Shunts: No atrial level shunt detected by color flow Doppler.  LEFT VENTRICLE PLAX 2D LVIDd:         4.20 cm   Diastology LVIDs:         2.50 cm   LV e' medial:    6.42 cm/s LV PW:         1.00 cm   LV E/e' medial:  12.3 LV IVS:        1.20 cm   LV e' lateral:   6.20 cm/s LVOT diam:     2.00 cm   LV E/e' lateral: 12.8 LV SV:         89 LV SV Index:   41 LVOT Area:     3.14 cm  RIGHT VENTRICLE             IVC RV Basal  diam:  2.50 cm     IVC diam: 2.25 cm RV S prime:     14.30 cm/s TAPSE (M-mode): 2.6 cm LEFT ATRIUM             Index        RIGHT ATRIUM           Index LA diam:        3.40 cm 1.58 cm/m   RA Area:      10.40 cm LA Vol (A2C):   28.8 ml 13.38 ml/m  RA Volume:   21.80 ml  10.13 ml/m LA Vol (A4C):   35.5 ml 16.49 ml/m LA Biplane Vol: 32.5 ml 15.09 ml/m  AORTIC VALVE LVOT Vmax:   152.00 cm/s LVOT Vmean:  105.000 cm/s LVOT VTI:    0.282 m  AORTA Ao Root diam: 3.30 cm Ao Asc diam:  3.50 cm MITRAL VALVE MV Area (PHT): 3.99 cm    SHUNTS MV Decel Time: 190 msec    Systemic VTI:  0.28 m MV E velocity: 79.20 cm/s  Systemic Diam: 2.00 cm MV A velocity: 96.70 cm/s MV E/A ratio:  0.82 Soyla Merck MD Electronically signed by Soyla Merck MD Signature Date/Time: 07/09/2024/10:16:58 PM    Final    CT Angio Chest PE W and/or Wo Contrast Result Date: 07/08/2024 CLINICAL DATA:  Pain with deep inspiration EXAM: CT ANGIOGRAPHY CHEST WITH CONTRAST TECHNIQUE: Multidetector CT imaging of the chest was performed using the standard protocol during bolus administration of intravenous contrast. Multiplanar CT image reconstructions and MIPs were obtained to evaluate the vascular anatomy. RADIATION DOSE REDUCTION: This exam was performed according to the departmental dose-optimization program which includes automated exposure control, adjustment of the mA and/or kV according to patient size and/or use of iterative reconstruction technique. CONTRAST:  80mL OMNIPAQUE  IOHEXOL  350 MG/ML SOLN COMPARISON:  Chest x-ray 07/08/2024, chest CT 06/20/2024 FINDINGS: Cardiovascular: Satisfactory opacification of the pulmonary arteries to the segmental level. No evidence of pulmonary embolism. Normal heart size. No pericardial effusion. Nonaneurysmal aorta. No dissection. Mediastinum/Nodes: No enlarged mediastinal, hilar, or axillary lymph nodes. Thyroid  gland, trachea, and esophagus demonstrate no significant findings. Lungs/Pleura: Lungs are clear. No pleural effusion or pneumothorax. Upper Abdomen: No acute finding. Musculoskeletal: No chest wall abnormality. No acute or significant osseous findings. Review of the MIP images confirms the  above findings. IMPRESSION: Negative. No CT evidence for acute pulmonary embolus or aortic dissection. Clear lung fields. Electronically Signed   By: Luke Bun M.D.   On: 07/08/2024 22:27   DG Chest 2 View Result Date: 07/08/2024 CLINICAL DATA:  Chest pain EXAM: DG CHEST 2V COMPARISON:  CT 06/20/2024 FINDINGS: The heart size and mediastinal contours are within normal limits. Both lungs are clear. Mild degenerative changes of the spine. IMPRESSION: No active cardiopulmonary disease. Electronically Signed   By: Luke Bun M.D.   On: 07/08/2024 19:33   CT CHEST LUNG CA SCREEN LOW DOSE W/O CM Result Date: 06/23/2024 CLINICAL DATA:  Current smoker with 53 pack-year smoking history. Lung cancer screening. EXAM: CT CHEST WITHOUT CONTRAST LOW-DOSE FOR LUNG CANCER SCREENING TECHNIQUE: Multidetector CT imaging of the chest was performed following the standard protocol without IV contrast. RADIATION DOSE REDUCTION: This exam was performed according to the departmental dose-optimization program which includes automated exposure control, adjustment of the mA and/or kV according to patient size and/or use of iterative reconstruction technique. COMPARISON:  CT chest dated 09/17/2021 FINDINGS: Cardiovascular: Normal heart size. No significant pericardial fluid/thickening. Great vessels are normal  in course and caliber. Coronary artery calcification. Mediastinum/Nodes: Imaged thyroid  gland without nodules meeting criteria for imaging follow-up by size. Normal esophagus. No pathologically enlarged axillary, supraclavicular, mediastinal, or hilar lymph nodes. Lungs/Pleura: The central airways are patent. Trace secretions within the trachea. Mild centrilobular emphysema. Scattered pulmonary nodules measuring up to 4.3 mm peripheral right lower lobe (9:122). No pneumothorax. No pleural effusion. Upper abdomen: Diffuse parenchymal hypoattenuation can be seen with hepatic steatosis. Musculoskeletal: No acute or abnormal  lytic or blastic osseous lesions. IMPRESSION: 1. Lung-RADS 2, benign appearance or behavior. Continue annual screening with low-dose chest CT without contrast in 12 months. 2. Hepatic steatosis. 3. Emphysema (ICD10-J43.9). Coronary artery calcifications. Assessment for potential risk factor modification, dietary therapy or pharmacologic therapy may be warranted, if clinically indicated. Electronically Signed   By: Limin  Xu M.D.   On: 06/23/2024 12:22    Disposition Patient is being discharged home today in good condition.  Follow-up Plans & Appointments  Discharge Instructions     Amb Referral to Cardiac Rehabilitation   Complete by: As directed    Diagnosis:  Coronary Stents PTCA NSTEMI     After initial evaluation and assessments completed: Virtual Based Care may be provided alone or in conjunction with Phase 2 Cardiac Rehab based on patient barriers.: Yes   Intensive Cardiac Rehabilitation (ICR) [facility] location only OR Traditional Cardiac Rehabilitation (TCR) *If criteria for ICR are not met will enroll in TCR [facility] only): Yes   Diet - low sodium heart healthy   Complete by: As directed    Increase activity slowly   Complete by: As directed        Discharge Medications Allergies as of 07/11/2024       Reactions   Penicillins Other (See Comments)   Unknown childhood reaction Has patient had a PCN reaction causing immediate rash, facial/tongue/throat swelling, SOB or lightheadedness with hypotension: Unknown Has patient had a PCN reaction causing severe rash involving mucus membranes or skin necrosis: Unknown Has patient had a PCN reaction that required hospitalization: Unknown Has patient had a PCN reaction occurring within the last 10 years: Unknown If all of the above answers are NO, then may proceed with Cephalosporin use. Other Reaction(s): Unknown Unknown childhood reaction, Has patient had a PCN reaction causing immediate rash, facial/tongue/throat swelling, SOB or  lightheadedness with hypotension: Unknown, Has patient had a PCN reaction causing severe rash involving mucus membranes or skin necrosis: Unknown, Has patient had a PCN reaction that required hospitalization: Unknown, Has patient had a PCN reaction occurring within the last 10 years: Unknown, If all of the above answers are NO, then may proceed with Cephalosporin use.        Medication List     TAKE these medications    aspirin EC 81 MG tablet Take 1 tablet (81 mg total) by mouth daily. Swallow whole. Start taking on: July 12, 2024   nicotine  14 mg/24hr patch Commonly known as: NICODERM CQ  - dosed in mg/24 hours Place 1 patch (14 mg total) onto the skin daily.   nicotine  polacrilex 2 MG lozenge Commonly known as: COMMIT Take 1 lozenge (2 mg total) by mouth as needed for smoking cessation.   nitroGLYCERIN 0.4 MG SL tablet Commonly known as: NITROSTAT Place 1 tablet (0.4 mg total) under the tongue every 5 (five) minutes x 3 doses as needed for chest pain. Do NOT take if you have taken Viagra  within the last 24 hours.   olmesartan -hydrochlorothiazide 20-12.5 MG tablet Commonly known as: BENICAR  HCT  Take 1 tablet by mouth daily.   rosuvastatin 40 MG tablet Commonly known as: CRESTOR Take 1 tablet (40 mg total) by mouth daily. Start taking on: July 12, 2024   sildenafil  50 MG tablet Commonly known as: Viagra  Take 0.5-1 tablets (25-50 mg total) by mouth daily as needed for erectile dysfunction.   ticagrelor 90 MG Tabs tablet Commonly known as: BRILINTA Take 1 tablet (90 mg total) by mouth 2 (two) times daily.         Outstanding Labs/Studies Repeat BMET at follow-up visit. Repeat lipid panel and LFTs in 6-8 weeks.  Duration of Discharge Encounter: APP Time: 40 minutes   Signed, Callie E Goodrich, PA-C 07/11/2024, 11:26 AM  Levi Johnston was seen by me today along with Callie Goodrich, PA-C. I have personally performed an evaluation on this patient.  My  findings are as follows: 58 y.o. male with h/o HTN and tobacco use admitted with chest pain and found to have a NSTEMI. Cardiac cath with severe LAD/Diagonal disease treated with complex PCI, stenting of the LAD and Diagonal. Echo with perserved LV systolic function with LVEF=60-65%. HK of mid and distal anterior septum and mid inferoseptal segment. He is doing well today. No chest pain.    Data: EKG(s) and pertinent labs, studies, etc were personally reviewed and interpreted by me:  Sinus, anterior T wave inversion Tele: sinus Labs reviewed by me Cath films and echo images reviewed by me Otherwise, I agree with data as outlined by the advanced practice provider.  Exam performed by me: Gen: NAD Neck: No JVD Cardiac: RRR without murmur Lungs: Clear bilaterally Extremities: No LE edema, Right wrist cath site without hematoma  My Assessment and Plan:  NSTEMI: No chest pain. Discharge home today on ASA/Brilinta for one year. Continue Crestor  HTN: BP controlled. Continue current therapy as above  HLD: Continue statin  Tobacco abuse: Smoking cessation is advised.   D/c home today.  I have spent 45 minutes on note review, cath film review, echo image review, EKG review, lab review, examination and plan formulation, note construction.    Signed,  Lonni Cash, MD  07/11/2024 11:29 AM

## 2024-07-11 NOTE — TOC CM/SW Note (Signed)
 Transition of Care [facility]) - Inpatient Brief Assessment   Patient Details  Name: Levi Johnston MRN: [id number] Date of Birth: May 21, 1966  Transition of Care [facility] CM/SW Contact:    Sudie Erminio Deems, RN Phone Number: 07/11/2024, 12:20 PM   Clinical Narrative: Patient presented for chest pain-Nstemi. PTA patient was from home with family support. Patient has Medicaid and benefits check submitted for Brilinta-cost is $4.00. Patient states he has PCP and hs transportation to appointments. No home needs identified at this time.     Transition of Care Asessment: Insurance and Status: Insurance coverage has been reviewed Patient has primary care physician: Yes Home environment has been reviewed: reviewed Prior level of function:: independent Prior/Current Home Services: No current home services Social Drivers of Health Review: SDOH reviewed no interventions necessary Readmission risk has been reviewed: Yes Transition of care needs: no transition of care needs at this time

## 2024-07-11 NOTE — Telephone Encounter (Signed)
 Pharmacy Patient Advocate Encounter  Insurance verification completed.    The patient is insured through [facility].    Ran test claim for Brand Brilinta 90mg  and the current 30 day co-pay is $4.00.   This test claim was processed through [facility]- copay amounts may vary at other pharmacies due to pharmacy/plan contracts, or as the patient moves through the different stages of their insurance plan.

## 2024-07-11 NOTE — Progress Notes (Signed)
 CARDIAC REHAB PHASE I   PRE:  Rate/Rhythm: 97 SR    BP: sitting 150/104    SpO2:   MODE:  Ambulation: 470 ft   POST:  Rate/Rhythm: 109 ST    BP: sitting 173/105     SpO2:    Pt tolerated ambulation well however his BP is high. He sts he was previously prescribed BP med that he took for 1 month, sts he did not know he should get a refill.  Discussed with pt MI, stents, restrictions, Brilinta, smoking cessation, diet, exercise, NTG, and CRPII. Pt receptive and asked appropriate questions. Feels he can make change but is very nervous about his ability to quit smoking. Resources and tips given. Will refer to [facility] CRPII.  [phone]  Aliene Aris BS, ACSM-CEP 07/11/2024 10:42 AM

## 2024-07-12 ENCOUNTER — Telehealth (HOSPITAL_COMMUNITY): Payer: Self-pay

## 2024-07-12 NOTE — Telephone Encounter (Signed)
 Phase II referral. Attempted to call patient regarding interest in cardiac rehab- no answer, left message. Sent MyChart message.  F/u 11/06.

## 2024-07-14 NOTE — Progress Notes (Unsigned)
 Cardiology Office Note:    Date:  07/21/2024   ID:  Levi Johnston, DOB Sep 10, 1966, MRN [id number]  PCP:  Levi Toribio POUR, MD  Cardiologist:  Levi Cash, MD     Referring MD: Levi Toribio POUR, MD   Chief Complaint: hospital follow-up of NSTEMI  History of Present Illness:    Levi Johnston is a 58 y.o. male with a history of CAD with recent NSTEMI in 06/2024 s/p DES to LAD and DES to 2nd Diag, hypertension, hyperlipidemia, prediabetes, and tobacco abuse who presents today for hospital follow-up of NSTEMI.  Patient was first seen by Cardiology during recent hospitalization. He was admitted from 07/08/2024 to 07/11/2024 for a NSTEMI after presenting with chest pain with associated shortness of breath and sweating. Echo showed LVEF of 60-65% with hypokinesis of the mid and distal anterior septum and mid inferoseptal segment, normal RV function, and mild chordal SAM with only trivial MR. LHC showed 95% stenosis of mid LAD and 70% stenosis of ostial D2 as well as mild disease of OM2 and RCA. He underwent complex PCI with DES to LAD and DES to D2. He was started on DAPT with Aspirin and Brilitna as well as a high-intensity statin.  He was readmitted from 07/15/2024 to 07/16/2024 for recurrent chest pain and dyspnea. He also reported feeling anxious since discharge which was likely contributing to symptoms.  EKG showed no acute ischemic changes. High-sensitivity troponin was significantly lower than recent admission. Brilinta was stopped and he was loaded with Effient. He was admitted overnight for observation and was feeling much better the next morning. BP was soft on admission and he was noted to be in AKI with creatinine peaking around 2.5 (baseline around 1.2). Therefore, Olmesartan -HCTZ was stopped and he was treated with IV fluids with some improvement. Creatinine was 2.03 on discharge.   Patient presents today for follow-up. Here with daughter.  He is doing well since most recent  discharge.  He denies any recurrent chest pain or shortness of breath since switching to Effient.  He has not required any sublingual Nitroglycerin. His only complaint today is fatigue.  He states he has never been a great sleeper but has not been sleeping well since his MI.  He is staying up late watching TV/movies and then sleeping during the day.  Daughter reports that he has a loud snorer but does not sound like he has had any apneic episodes.  He has never had a sleep study.  He continues to smoke but is trying to quit and has had a lot of progress so far.  He was previously smoking 2 packs/day prior to his MI and he is now down to at most 10 cigarettes/day.  EKGs/Labs/Other Studies Reviewed:    The following studies were reviewed:  Echocardiogram 07/09/2024: Impressions: 1. Left ventricular ejection fraction, by estimation, is 60 to 65%. The  left ventricle has normal function. The left ventricle demonstrates  regional wall motion abnormalities (see scoring diagram/findings for  description). There is mild asymmetric left  ventricular hypertrophy of the septal segment. Left ventricular diastolic  parameters are consistent with Grade I diastolic dysfunction (impaired  relaxation).   2. Right ventricular systolic function is normal. The right ventricular  size is normal. Tricuspid regurgitation signal is inadequate for assessing  PA pressure.   3. Mild chordal SAM. The mitral valve is normal in structure. Trivial  mitral valve regurgitation. No evidence of mitral stenosis.   4. The aortic valve is normal in structure. Aortic  valve regurgitation is  not visualized. No aortic stenosis is present.   5. The inferior vena cava is dilated in size with <50% respiratory  variability, suggesting right atrial pressure of 15 mmHg.  _______________   Left Cardiac Catheterization 07/10/2024: Conclusions: Severe single-vessel coronary artery disease with 95% mid LAD stenosis involving  moderate-caliber D2 branch with acute angulation.  There is also 50% proximal LAD disease as well as 30% OM2 and 40% RCA stenoses. Mildly elevated left ventricular filling pressure (LVEDP 20 mmHg). Complex PCI to proximal/mid LAD and D2 using crush technique (Onyx Frontier 3.0 x 34 mm LAD and 2.25 x 12 mm D2 drug-eluting stents) with 0% residual stenosis in the LAD and less than 10% residual stenosis in ostium of D2 with TIMI-3 flow throughout the left coronary artery.   Recommendations: Continue cangrelor infusion for 2 hours following administration of ticagrelor at the end of the intervention. Dual antiplatelet therapy with aspirin and ticagrelor for at least 12 months. Aggressive secondary prevention of coronary artery disease, including high intensity statin therapy and smoking cessation. Diagnostic Dominance: Right  Intervention         EKG:  EKG ordered today.   EKG Interpretation Date/Time:  Thursday July 21 2024 13:44:48 EST Ventricular Rate:  90 PR Interval:  162 QRS Duration:  102 QT Interval:  340 QTC Calculation: 415 R Axis:   -20  Text Interpretation: Normal sinus rhythm  Incomplete right bundle branch block   Q waves noted in lead III  T waves inversions in leads V2-V4  No significant changes compared to prior tracings. T wave inversions improved from EKG on 07/10/2024.      Confirmed by Levi Johnston [phone]) on 07/21/2024 2:01:23 PM    Recent Labs: 04/04/2024: TSH 3.150 07/08/2024: ALT 50 07/16/2024: BUN 38; Creatinine, Ser 2.03; Hemoglobin 16.6; Platelets 322; Potassium 4.5; Sodium 138  Recent Lipid Panel    Component Value Date/Time   CHOL 242 (H) 07/08/2024 2128   CHOL 221 (H) 09/26/2021 0820   TRIG 175 (H) 07/08/2024 2128   HDL 41 07/08/2024 2128   HDL 37 (L) 09/26/2021 0820   CHOLHDL 5.9 07/08/2024 2128   VLDL 35 07/08/2024 2128   LDLCALC 166 (H) 07/08/2024 2128   LDLCALC 143 (H) 09/26/2021 0820    Physical Exam:    Vital Signs: BP 124/80    Pulse 90   Ht 6' (1.829 m)   Wt 209 lb (94.8 kg)   SpO2 95%   BMI 28.35 kg/m     Wt Readings from Last 3 Encounters:  07/21/24 209 lb (94.8 kg)  07/15/24 208 lb 6.4 oz (94.5 kg)  07/08/24 205 lb (93 kg)     General: 58 y.o. Caucasian male in no acute distress. HEENT: Normocephalic and atraumatic. Sclera clear.  Neck: Supple. No carotid bruits. No JVD. Heart:  RRR. Distinct S1 and S2. No murmurs, gallops, or rubs.  Lungs: No increased work of breathing. Clear to ausculation bilaterally. No wheezes, rhonchi, or rales.  Extremities: No lower extremity edema. Right radial cath site soft with no signs of hematoma. Radial, posterior tibial, and distal pedal pulses 2+ and equal bilaterally. Skin: Warm and dry. Neuro: No focal deficits. Psych: Normal affect. Responds appropriately.  Assessment:    1. Coronary artery disease involving native coronary artery of native heart without angina pectoris   2. History of non-ST elevation myocardial infarction (NSTEMI)   3. Hypertension, unspecified type   4. Hyperlipidemia, unspecified hyperlipidemia type   5. Prediabetes  6. AKI (acute kidney injury)   7. Fatigue, unspecified type   8. Snoring     Plan:    CAD with Recent NSTEMI Patient was recently admitted for a NSTEMI in 06/2024 and underwent DES to LAD and DES to 2nd Diag.  - No recurrent chest pain. - Continue DAPT with Aspirin 81mg  daily and Effient 10mg  daily. .  - Continue Crestor 40mg  daily.  - Encouraged patient to slowly increase activity as tolerated. Ok to start Cardiac Rehab.  Hypertension BP well controlled.  - Previously on Olmesartan -HCTZ but this was stopped during recent admission due to soft BP and AKI. Doing well without any medications.   Hyperlipidemia Lipid panel in 06/2024 during recent admission: Total Cholesterol 242, Triglycerides 175, HDL 41, LDL 166. Lipoprotein (a) 32.7. LDL goal <70 given CAD.  - He was started on Crestor 40mg  daily during recent  admission. Continue.  - Will repeat lipid panel in 6-8 weeks.   Prediabetes Hemoglobin A1c 6.1% in 06/2024.  - Continue lifestyle modifications.   AKI Patient was noted to have AKI during recent admission with creatinine peaking at 2.50 (baseline around 2.03). His Olmesartan -HCTZ was stopped and he was treated with IV fluids. Creatinine improved to 2.03 on discharge.  - Will repeat BMET today.   Tobacco Abuse Patient is trying to quit. He was previously smoking 2 packs per day and is now down to at most 10 cigarettes per day.  - Congratulated patient on the progress he has made so far and encouraged him to continue to work towards complete cessation. He has Nicotine  patches at home and is very motivated to quit.   Fatigue Snoring Patient reports fatigue. He does describe poor sleep habits but his daughter also states that he snores loudly. TSH normal in 03/2024. - Will order [facility] home sleep study. - Discussed good sleep hygiene.   Disposition: Follow up in 3 months.   Signed, Aline FORBES Door, PA-C  07/21/2024 3:19 PM    [facility]

## 2024-07-15 ENCOUNTER — Observation Stay (HOSPITAL_COMMUNITY)
Admission: EM | Admit: 2024-07-15 | Discharge: 2024-07-16 | Disposition: A | Discharge status: Home / Self Care | Attending: Cardiovascular Disease | Admitting: Cardiovascular Disease

## 2024-07-15 ENCOUNTER — Emergency Department (HOSPITAL_COMMUNITY)

## 2024-07-15 ENCOUNTER — Encounter (HOSPITAL_COMMUNITY): Payer: Self-pay | Admitting: Cardiovascular Disease

## 2024-07-15 DIAGNOSIS — Z7982 Long term (current) use of aspirin: Secondary | ICD-10-CM | POA: Insufficient documentation

## 2024-07-15 DIAGNOSIS — I25118 Atherosclerotic heart disease of native coronary artery with other forms of angina pectoris: Secondary | ICD-10-CM | POA: Diagnosis present

## 2024-07-15 DIAGNOSIS — I25729 Atherosclerosis of autologous artery coronary artery bypass graft(s) with unspecified angina pectoris: Principal | ICD-10-CM | POA: Insufficient documentation

## 2024-07-15 DIAGNOSIS — R079 Chest pain, unspecified: Secondary | ICD-10-CM | POA: Diagnosis not present

## 2024-07-15 DIAGNOSIS — N179 Acute kidney failure, unspecified: Secondary | ICD-10-CM | POA: Diagnosis not present

## 2024-07-15 DIAGNOSIS — F17220 Nicotine dependence, chewing tobacco, uncomplicated: Secondary | ICD-10-CM | POA: Diagnosis not present

## 2024-07-15 DIAGNOSIS — I10 Essential (primary) hypertension: Secondary | ICD-10-CM | POA: Diagnosis not present

## 2024-07-15 DIAGNOSIS — F17209 Nicotine dependence, unspecified, with unspecified nicotine-induced disorders: Secondary | ICD-10-CM | POA: Diagnosis present

## 2024-07-15 DIAGNOSIS — Z79899 Other long term (current) drug therapy: Secondary | ICD-10-CM | POA: Diagnosis not present

## 2024-07-15 DIAGNOSIS — E782 Mixed hyperlipidemia: Secondary | ICD-10-CM | POA: Diagnosis present

## 2024-07-15 DIAGNOSIS — R7303 Prediabetes: Secondary | ICD-10-CM | POA: Diagnosis present

## 2024-07-15 DIAGNOSIS — R0789 Other chest pain: Secondary | ICD-10-CM | POA: Diagnosis not present

## 2024-07-15 HISTORY — DX: Atherosclerotic heart disease of native coronary artery without angina pectoris: I25.10

## 2024-07-15 LAB — I-STAT CHEM 8, ED
BUN: 36 mg/dL — ABNORMAL HIGH (ref 6–20)
Calcium, Ion: 1.16 mmol/L (ref 1.15–1.40)
Chloride: 104 mmol/L (ref 98–111)
Creatinine, Ser: 2.5 mg/dL — ABNORMAL HIGH (ref 0.61–1.24)
Glucose, Bld: 114 mg/dL — ABNORMAL HIGH (ref 70–99)
HCT: 54 % — ABNORMAL HIGH (ref 39.0–52.0)
Hemoglobin: 18.4 g/dL — ABNORMAL HIGH (ref 13.0–17.0)
Potassium: 4.5 mmol/L (ref 3.5–5.1)
Sodium: 139 mmol/L (ref 135–145)
TCO2: 23 mmol/L (ref 22–32)

## 2024-07-15 LAB — BASIC METABOLIC PANEL WITH GFR
Anion gap: 14 (ref 5–15)
BUN: 33 mg/dL — ABNORMAL HIGH (ref 6–20)
CO2: 22 mmol/L (ref 22–32)
Calcium: 9.3 mg/dL (ref 8.9–10.3)
Chloride: 100 mmol/L (ref 98–111)
Creatinine, Ser: 2.28 mg/dL — ABNORMAL HIGH (ref 0.61–1.24)
GFR, Estimated: 33 mL/min — ABNORMAL LOW (ref >60)
Glucose, Bld: 114 mg/dL — ABNORMAL HIGH (ref 70–99)
Potassium: 4.6 mmol/L (ref 3.5–5.1)
Sodium: 136 mmol/L (ref 135–145)

## 2024-07-15 LAB — CBC
HCT: 52.5 % — ABNORMAL HIGH (ref 39.0–52.0)
Hemoglobin: 17.6 g/dL — ABNORMAL HIGH (ref 13.0–17.0)
MCH: 29.1 pg (ref 26.0–34.0)
MCHC: 33.5 g/dL (ref 30.0–36.0)
MCV: 86.9 fL (ref 80.0–100.0)
Platelets: 308 K/uL (ref 150–400)
RBC: 6.04 MIL/uL — ABNORMAL HIGH (ref 4.22–5.81)
RDW: 14.5 % (ref 11.5–15.5)
WBC: 9.9 K/uL (ref 4.0–10.5)
nRBC: 0 % (ref 0.0–0.2)

## 2024-07-15 LAB — TROPONIN I (HIGH SENSITIVITY)
Troponin I (High Sensitivity): 90 ng/L — ABNORMAL HIGH (ref <18)
Troponin I (High Sensitivity): 87 ng/L — ABNORMAL HIGH (ref <18)

## 2024-07-15 MED ORDER — PRASUGREL HCL 10 MG PO TABS
10.0000 mg | ORAL_TABLET | Freq: Every day | ORAL | Status: DC
  Administered 2024-07-16: 10 mg via ORAL
  Filled 2024-07-15: qty 1

## 2024-07-15 MED ORDER — SODIUM CHLORIDE 0.9 % IV SOLN: INTRAVENOUS | Status: AC

## 2024-07-15 MED ORDER — MORPHINE SULFATE (PF) 4 MG/ML IV SOLN: 4.0000 mg | INTRAVENOUS | Status: DC | PRN

## 2024-07-15 MED ORDER — ROSUVASTATIN CALCIUM 20 MG PO TABS
40.0000 mg | ORAL_TABLET | Freq: Every day | ORAL | Status: DC
  Administered 2024-07-16: 40 mg via ORAL
  Filled 2024-07-15: qty 2

## 2024-07-15 MED ORDER — ACETAMINOPHEN 325 MG PO TABS: 650.0000 mg | ORAL_TABLET | Freq: Four times a day (QID) | ORAL | Status: DC | PRN

## 2024-07-15 MED ORDER — ASPIRIN 81 MG PO TBEC
81.0000 mg | DELAYED_RELEASE_TABLET | Freq: Every day | ORAL | Status: DC
Start: 2024-07-16 — End: 2024-07-16
  Administered 2024-07-16: 81 mg via ORAL
  Filled 2024-07-15: qty 1

## 2024-07-15 MED ORDER — LACTATED RINGERS IV BOLUS
500.0000 mL | Freq: Once | INTRAVENOUS | Status: AC
  Administered 2024-07-15: 500 mL via INTRAVENOUS

## 2024-07-15 MED ORDER — NICOTINE 14 MG/24HR TD PT24
14.0000 mg | MEDICATED_PATCH | Freq: Every day | TRANSDERMAL | Status: DC
Start: 2024-07-15 — End: 2024-07-16
  Administered 2024-07-15 – 2024-07-16 (×2): 14 mg via TRANSDERMAL
  Filled 2024-07-15 (×2): qty 1

## 2024-07-15 MED ORDER — NITROGLYCERIN 0.4 MG SL SUBL: 0.4000 mg | SUBLINGUAL_TABLET | SUBLINGUAL | Status: DC | PRN

## 2024-07-15 MED ORDER — PRASUGREL HCL 10 MG PO TABS
60.0000 mg | ORAL_TABLET | Freq: Once | ORAL | Status: AC
  Administered 2024-07-15: 60 mg via ORAL
  Filled 2024-07-15 (×2): qty 6

## 2024-07-15 MED ORDER — ACETAMINOPHEN 650 MG RE SUPP: 650.0000 mg | Freq: Four times a day (QID) | RECTAL | Status: DC | PRN

## 2024-07-15 MED ORDER — HEPARIN SODIUM (PORCINE) 5000 UNIT/ML IJ SOLN
5000.0000 [IU] | Freq: Three times a day (TID) | INTRAMUSCULAR | Status: DC
  Administered 2024-07-15 – 2024-07-16 (×2): 5000 [IU] via SUBCUTANEOUS
  Filled 2024-07-15 (×2): qty 1

## 2024-07-15 NOTE — ED Triage Notes (Addendum)
 PT here for centralized CP that began mildly 2/10 this AM.  Increased to a 5/10 around 1300. PT recently NSTEMI with cardiac cath and stent placement. States the cath and stent placement were complicated. PT endorses intermittent SOB.  No NV. Also endorses tightness in back of neck.

## 2024-07-15 NOTE — H&P (Signed)
 Cardiology Admission History and Physical   Patient ID: Levi Johnston MRN: [id number]; DOB: March 24, 1966   Admission date: 07/15/2024  PCP:  Chandra Toribio POUR, MD   [facility] Providers Cardiologist:  Lonni Cash, MD      Chief Complaint:  Chest pain, dyspnea   History of Present Illness:  Levi Johnston is a 58 yo male with history of CAD, HTN and tobacco abuse who is presenting to the ED with c/o chest pain and dyspnea. He was recently discharged on 07/11/24 after being admitted with a NSTEMI. Cardiac cath 07/10/24 with severe mid LAD stenosis involving a moderate caliber diagonal branch as well as moderate disease in the proximal LAD, OM2 and RCA. The mid LAD/Diagonal stenosis was treated with bifurcation stenting of the LAD/Diagonal. Echo 07/09/24 with LVEF=60-65%. No significant valve disease. He did well following his PCI and wa discharged on 07/11/24. He did have dyspnea following administration of Brilinta and was given instructions to drink caffeine. He is an active smoker.   He is now presenting to the ED with c/o chest and dyspnea today. This has been going on daily over the past 4 days. He notes being anxious after his recent discharge. He has cut back to 5 cigarettes per day (down from 2 ppd). He is currently pain free. EKG with no ischemic changes-Sinus with chronic subtle ST abnormality of the inferior leads. Troponin is pending. Creatinine is now up to 2.5 from 1.2.    Past Medical History:  Diagnosis Date   Anxiety    CAD (coronary artery disease)    Depression    Hypertension    Past Surgical History:  Procedure Laterality Date   CORONARY STENT INTERVENTION N/A 07/10/2024   Procedure: CORONARY STENT INTERVENTION;  Surgeon: Mady Lonni, MD;  Location: [facility];  Service: Cardiovascular;  Laterality: N/A;   EXCISION MASS NECK Left 05/18/2018   Procedure: EXCISION LEFT LEVEL TWO NECK MASS;  Surgeon: Arlana Arnt, MD;  Location: [facility] OR;   Service: ENT;  Laterality: Left;   LEFT HEART CATH AND CORONARY ANGIOGRAPHY N/A 07/10/2024   Procedure: LEFT HEART CATH AND CORONARY ANGIOGRAPHY;  Surgeon: Mady Lonni, MD;  Location: [facility];  Service: Cardiovascular;  Laterality: N/A;   TONSILLECTOMY  05/03/2012   Procedure: TONSILLECTOMY;  Surgeon: Ida Loader, MD;  Location: [facility];  Service: ENT;  Laterality: N/A;     Medications Prior to Admission: Prior to Admission medications   Medication Sig Start Date End Date Taking? Authorizing Provider  aspirin EC 81 MG tablet Take 1 tablet (81 mg total) by mouth daily. Swallow whole. 07/12/24   Goodrich, Callie E, PA-C  nicotine  (NICODERM CQ  - DOSED IN MG/24 HOURS) 14 mg/24hr patch Place 1 patch (14 mg total) onto the skin daily. 07/11/24   Goodrich, Callie E, PA-C  nicotine  polacrilex (COMMIT) 2 MG lozenge Take 1 lozenge (2 mg total) by mouth as needed for smoking cessation. 06/06/24   Chandra Toribio POUR, MD  nitroGLYCERIN (NITROSTAT) 0.4 MG SL tablet Place 1 tablet (0.4 mg total) under the tongue every 5 (five) minutes x 3 doses as needed for chest pain. Do NOT take if you have taken Viagra  within the last 24 hours. 07/11/24   Goodrich, Callie E, PA-C  olmesartan -hydrochlorothiazide (BENICAR  HCT) 20-12.5 MG tablet Take 1 tablet by mouth daily. 06/06/24   Chandra Toribio POUR, MD  rosuvastatin (CRESTOR) 40 MG tablet Take 1 tablet (40 mg total) by mouth daily. 07/12/24   Jadine,  Callie E, PA-C  sildenafil  (VIAGRA ) 50 MG tablet Take 0.5-1 tablets (25-50 mg total) by mouth daily as needed for erectile dysfunction. 03/30/24   Chandra Toribio POUR, MD  ticagrelor (BRILINTA) 90 MG TABS tablet Take 1 tablet (90 mg total) by mouth 2 (two) times daily. 07/11/24   Goodrich, Callie E, PA-C     Allergies:    Allergies  Allergen Reactions   Penicillins Other (See Comments)    Unknown childhood reaction  Has patient had a PCN reaction causing immediate rash, facial/tongue/throat  swelling, SOB or lightheadedness with hypotension: Unknown  Has patient had a PCN reaction causing severe rash involving mucus membranes or skin necrosis: Unknown  Has patient had a PCN reaction that required hospitalization: Unknown  Has patient had a PCN reaction occurring within the last 10 years: Unknown  If all of the above answers are NO, then may proceed with Cephalosporin use.  Other Reaction(s): Unknown  Unknown childhood reaction, Has patient had a PCN reaction causing immediate rash, facial/tongue/throat swelling, SOB or lightheadedness with hypotension: Unknown, Has patient had a PCN reaction causing severe rash involving mucus membranes or skin necrosis: Unknown, Has patient had a PCN reaction that required hospitalization: Unknown, Has patient had a PCN reaction occurring within the last 10 years: Unknown, If all of the above answers are NO, then may proceed with Cephalosporin use.    Social History:   Social History   Socioeconomic History   Marital status: Divorced    Spouse name: Not on file   Number of children: Not on file   Years of education: Not on file   Highest education level: GED or equivalent  Occupational History   Not on file  Tobacco Use   Smoking status: Every Day    Current packs/day: 1.00    Average packs/day: 1.2 packs/day for 50.0 years (60.0 ttl pk-yrs)    Types: Cigarettes   Smokeless tobacco: Never   Tobacco comments:    I would like to quit  Vaping Use   Vaping status: Never Used  Substance and Sexual Activity   Alcohol use: Not Currently    Comment: drinks very rare   Drug use: Not Currently    Types: Marijuana, Cocaine    Comment: nothing recently   Sexual activity: Not Currently    Comment: smokes  1 pack a day  Other Topics Concern   Not on file  Social History Narrative   Not on file   Social Drivers of Health   Financial Resource Strain: Medium Risk (06/06/2024)   Overall Financial Resource Strain (CARDIA)     Difficulty of Paying Living Expenses: Somewhat hard  Food Insecurity: No Food Insecurity (07/09/2024)   Hunger Vital Sign    Worried About Running Out of Food in the Last Year: Never true    Ran Out of Food in the Last Year: Never true  Transportation Needs: No Transportation Needs (07/09/2024)   PRAPARE - Administrator, Civil Service (Medical): No    Lack of Transportation (Non-Medical): No  Physical Activity: Insufficiently Active (06/06/2024)   Exercise Vital Sign    Days of Exercise per Week: 3 days    Minutes of Exercise per Session: 20 min  Stress: Stress Concern Present (06/06/2024)   Levi Johnston    Feeling of Stress: Very much  Social Connections: Socially Isolated (06/06/2024)   Social Connection and Isolation Panel    Frequency of Communication with Friends and Family:  More than three times a week    Frequency of Social Gatherings with Friends and Family: Once a week    Attends Religious Services: Never    Database Administrator or Organizations: No    Attends Engineer, Structural: Not on file    Marital Status: Divorced  Intimate Partner Violence: Not At Risk (07/09/2024)   Humiliation, Afraid, Rape, and Kick Johnston    Fear of Current or Ex-Partner: No    Emotionally Abused: No    Physically Abused: No    Sexually Abused: No     Family History:  The patient's family history includes Anxiety disorder in his father; Breast cancer in his mother; COPD in his mother; Cancer in his brother and mother; Depression in his brother and mother; Diabetes in his daughter; Stroke in his brother; Throat cancer in his brother.    ROS:  Please see the history of present illness.  All other ROS reviewed and negative.     Physical Exam/Data: Vitals:   07/15/24 1455 07/15/24 1506 07/15/24 1517 07/15/24 1622  BP: (!) 76/48 90/66 102/63 105/69  Pulse: (!) 105  98 89  Resp:    19  Temp:    97.6  F (36.4 C)  TempSrc:    Oral  SpO2: 99%  (!) 84% 96%   No intake or output data in the 24 hours ending 07/15/24 1717    07/08/2024    5:20 PM 07/08/2024    3:54 PM 06/06/2024    1:39 PM  Last 3 Weights  Weight (lbs) 205 lb 208 lb 5.4 oz 208 lb 6.4 oz  Weight (kg) 92.987 kg 94.5 kg 94.53 kg     There is no height or weight on file to calculate BMI.  General:  Well nourished, well developed, in no acute distress HEENT: normal Neck: no JVD Vascular: No carotid bruits; Distal pulses 2+ bilaterally   Cardiac:  normal S1, S2; RRR; no murmur  Lungs:  clear to auscultation bilaterally, no wheezing, rhonchi or rales  Abd: soft, nontender, no hepatomegaly  Ext: no edema Musculoskeletal:  No deformities, BUE and BLE strength normal and equal Skin: warm and dry  Neuro:  CNs 2-12 intact, no focal abnormalities noted Psych:  Normal affect   EKG:  The ECG that was done was personally reviewed and demonstrates sinus tachycardia, subtle inferior ST elevation-unchanged. Poor R wave progression precordial leads.   Laboratory Data: High Sensitivity Troponin:   Recent Labs  Lab 07/08/24 1912 07/08/24 2128  TROPONINIHS 594* 805*      Chemistry Recent Labs  Lab 07/10/24 0139 07/11/24 0412 07/15/24 1512  NA 136 138 139  K 3.8 3.7 4.5  CL 99 103 104  CO2 23 25  --   GLUCOSE 148* 171* 114*  BUN 23* 14 36*  CREATININE 1.28* 1.21 2.50*  CALCIUM 8.6* 9.0  --   GFRNONAA >60 >60  --   ANIONGAP 14 10  --     Recent Labs  Lab 07/08/24 1912  PROT 8.1  ALBUMIN 4.2  AST 37  ALT 50*  ALKPHOS 56  BILITOT 1.0   Lipids  Recent Labs  Lab 07/08/24 2128  CHOL 242*  TRIG 175*  HDL 41  LDLCALC 166*  CHOLHDL 5.9   Hematology Recent Labs  Lab 07/11/24 0412 07/15/24 1507 07/15/24 1512  WBC 10.1 9.9  --   RBC 5.01 6.04*  --   HGB 14.8 17.6* 18.4*  HCT 42.9 52.5* 54.0*  MCV  85.6 86.9  --   MCH 29.5 29.1  --   MCHC 34.5 33.5  --   RDW 14.6 14.5  --   PLT 259 308  --     Thyroid  No results for input(s): TSH, FREET4 in the last 168 hours. BNPNo results for input(s): BNP, PROBNP in the last 168 hours.  DDimer No results for input(s): DDIMER in the last 168 hours.  Radiology/Studies:  DG Chest 2 View Result Date: 07/15/2024 EXAM: 2 VIEW(S) XRAY OF THE CHEST 07/15/2024 04:00:00 PM COMPARISON: 07/08/2024 CLINICAL HISTORY: cp cp FINDINGS: LUNGS AND PLEURA: No focal pulmonary opacity. No pulmonary edema. No pleural effusion. No pneumothorax. HEART AND MEDIASTINUM: No acute abnormality of the cardiac and mediastinal silhouettes. BONES AND SOFT TISSUES: No acute osseous abnormality. IMPRESSION: 1. No acute cardiopulmonary disease. Electronically signed by: Lynwood Seip MD 07/15/2024 04:28 PM EDT RP Workstation: [id number]   Assessment and Plan:  CAD with angina: He is presenting with chest pain after recent complex stenting of the LAD/Diagonal bifurcation. EKG is unchanged. Troponin is pending. His dyspnea may be secondary to his Brilinta. He has no evidence of volume overload. Chest x-ray is clear.  -Will stop Brilinta and change to Effient (Effient 60 mg load tonight and then Effient 10 mg starting tomorrow).  -Continue ASA and Crestor -Cycle troponin. I would expect the troponin will be mildly abnormal and trending down from the elevation earlier this week.  If troponin becomes significantly elevated with upward trend, would start IV heparin -May need relook cath next week if he continues to have chest pain over the weekend  Acute renal failure: Unclear etiology. Will hold ARB/hydrochlorothiazide. Repeat BMET in am. Hydrate gently tonight.   Code Status: Full Code  Severity of Illness: The appropriate patient status for this patient is INPATIENT. Inpatient status is judged to be reasonable and necessary in order to provide the required intensity of service to ensure the patient's safety. The patient's presenting symptoms, physical exam findings, and  initial radiographic and laboratory data in the context of their chronic comorbidities is felt to place them at high risk for further clinical deterioration. Furthermore, it is not anticipated that the patient will be medically stable for discharge from the hospital within 2 midnights of admission.   * I certify that at the point of admission it is my clinical judgment that the patient will require inpatient hospital care spanning beyond 2 midnights from the point of admission due to high intensity of service, high risk for further deterioration and high frequency of surveillance required.*  For questions or updates, please contact [facility] Please consult [email or web address] for contact info under     Signed, Lonni Cash, MD  07/15/2024 5:17 PM

## 2024-07-15 NOTE — ED Provider Notes (Addendum)
 [place] EMERGENCY DEPARTMENT AT [facility] Provider Note   CSN: [id number] Arrival date & time: 07/15/24  1448     Patient presents with: Chest Pain   Levi Johnston is a 58 y.o. male.    Chest Pain Associated symptoms: shortness of breath      58 year old male with medical history significant for recent NSTEMI status post cardiac catheterization on 10/26 where he was found to have severe single-vessel CAD with 95% mid LAD stenosis involving a moderate caliber D2 branch with acute angulation, subsequently status post PCI to the proximal/mid LAD and D2, now on aspirin and Brilinta presenting to the emergency department with chest pain and shortness of breath.  The patient has had a dull ache since his catheterization, described as a 1-2 out of 10.  Today he developed worsening pain, not as severe as when he had his MI last week but persistent enough and severe enough to cause him concern resulting in his presentation to the emergency department.  Pain has been intermittent, described as substernal, dull ache in sensation, not radiating to the back, no ripping or tearing component.  He has had also mild shortness of breath as well.  He endorses a bilateral tightness in his neck, denies any nausea, vomiting, diaphoresis.  No fevers or chills.  He is currently pain-free.  Prior to Admission medications   Medication Sig Start Date End Date Taking? Authorizing Provider  aspirin EC 81 MG tablet Take 1 tablet (81 mg total) by mouth daily. Swallow whole. 07/12/24   Goodrich, Callie E, PA-C  nicotine  (NICODERM CQ  - DOSED IN MG/24 HOURS) 14 mg/24hr patch Place 1 patch (14 mg total) onto the skin daily. 07/11/24   Goodrich, Callie E, PA-C  nicotine  polacrilex (COMMIT) 2 MG lozenge Take 1 lozenge (2 mg total) by mouth as needed for smoking cessation. 06/06/24   Chandra Toribio POUR, MD  nitroGLYCERIN (NITROSTAT) 0.4 MG SL tablet Place 1 tablet (0.4 mg total) under the tongue every 5 (five)  minutes x 3 doses as needed for chest pain. Do NOT take if you have taken Viagra  within the last 24 hours. 07/11/24   Goodrich, Callie E, PA-C  olmesartan -hydrochlorothiazide (BENICAR  HCT) 20-12.5 MG tablet Take 1 tablet by mouth daily. 06/06/24   Chandra Toribio POUR, MD  rosuvastatin (CRESTOR) 40 MG tablet Take 1 tablet (40 mg total) by mouth daily. 07/12/24   Goodrich, Callie E, PA-C  sildenafil  (VIAGRA ) 50 MG tablet Take 0.5-1 tablets (25-50 mg total) by mouth daily as needed for erectile dysfunction. 03/30/24   Chandra Toribio POUR, MD  ticagrelor (BRILINTA) 90 MG TABS tablet Take 1 tablet (90 mg total) by mouth 2 (two) times daily. 07/11/24   Goodrich, Callie E, PA-C    Allergies: Penicillins    Review of Systems  Respiratory:  Positive for shortness of breath.   Cardiovascular:  Positive for chest pain.  All other systems reviewed and are negative.   Updated Vital Signs BP 105/69 (BP Location: Right Arm)   Pulse 89   Temp 97.6 F (36.4 C) (Oral)   Resp 19   SpO2 96%   Physical Exam Vitals and nursing note reviewed.  Constitutional:      General: He is not in acute distress.    Appearance: He is well-developed. He is not ill-appearing or diaphoretic.  HENT:     Head: Normocephalic and atraumatic.  Eyes:     Conjunctiva/sclera: Conjunctivae normal.  Cardiovascular:     Rate and Rhythm: Normal  rate and regular rhythm.     Pulses: Normal pulses.     Heart sounds: No murmur heard. Pulmonary:     Effort: Pulmonary effort is normal. No respiratory distress.     Breath sounds: Normal breath sounds.  Chest:     Chest wall: Tenderness present.     Comments: Mild left sided chest wall TTP Abdominal:     Palpations: Abdomen is soft.     Tenderness: There is no abdominal tenderness.  Musculoskeletal:        General: No swelling.     Cervical back: Neck supple.  Skin:    General: Skin is warm and dry.     Capillary Refill: Capillary refill takes less than 2 seconds.  Neurological:      Mental Status: He is alert.  Psychiatric:        Mood and Affect: Mood normal.     (all labs ordered are listed, but only abnormal results are displayed) Labs Reviewed  BASIC METABOLIC PANEL WITH GFR - Abnormal; Notable for the following components:      Result Value   Glucose, Bld 114 (*)    BUN 33 (*)    Creatinine, Ser 2.28 (*)    GFR, Estimated 33 (*)    All other components within normal limits  CBC - Abnormal; Notable for the following components:   RBC 6.04 (*)    Hemoglobin 17.6 (*)    HCT 52.5 (*)    All other components within normal limits  I-STAT CHEM 8, ED - Abnormal; Notable for the following components:   BUN 36 (*)    Creatinine, Ser 2.50 (*)    Glucose, Bld 114 (*)    Hemoglobin 18.4 (*)    HCT 54.0 (*)    All other components within normal limits  TROPONIN I (HIGH SENSITIVITY) - Abnormal; Notable for the following components:   Troponin I (High Sensitivity) 90 (*)    All other components within normal limits  CBC  BASIC METABOLIC PANEL WITH GFR  TROPONIN I (HIGH SENSITIVITY)    EKG: EKG Interpretation Date/Time:  Friday July 15 2024 14:52:07 EDT Ventricular Rate:  103 PR Interval:  154 QRS Duration:  92 QT Interval:  334 QTC Calculation: 437 R Axis:   83  Text Interpretation: Sinus tachycardia Possible Inferior infarct , age undetermined Anterior infarct , age undetermined Abnormal ECG When compared with ECG of 11-Jul-2024 04:48, PREVIOUS ECG IS PRESENT Confirmed by Jerrol Agent ([id number]) on 07/15/2024 3:20:12 PM  Radiology: DG Chest 2 View Result Date: 07/15/2024 EXAM: 2 VIEW(S) XRAY OF THE CHEST 07/15/2024 04:00:00 PM COMPARISON: 07/08/2024 CLINICAL HISTORY: cp cp FINDINGS: LUNGS AND PLEURA: No focal pulmonary opacity. No pulmonary edema. No pleural effusion. No pneumothorax. HEART AND MEDIASTINUM: No acute abnormality of the cardiac and mediastinal silhouettes. BONES AND SOFT TISSUES: No acute osseous abnormality. IMPRESSION: 1. No acute  cardiopulmonary disease. Electronically signed by: Agent Seip MD 07/15/2024 04:28 PM EDT RP Workstation: [id number]     Procedures   Medications Ordered in the ED  morphine (PF) 4 MG/ML injection 4 mg (has no administration in time range)  aspirin EC tablet 81 mg (has no administration in time range)  nicotine  (NICODERM CQ  - dosed in mg/24 hours) patch 14 mg (has no administration in time range)  nitroGLYCERIN (NITROSTAT) SL tablet 0.4 mg (has no administration in time range)  rosuvastatin (CRESTOR) tablet 40 mg (has no administration in time range)  prasugrel (EFFIENT) tablet 60 mg (has no administration  in time range)    Followed by  prasugrel (EFFIENT) tablet 10 mg (has no administration in time range)  0.9 %  sodium chloride  infusion (has no administration in time range)  heparin injection 5,000 Units (has no administration in time range)  acetaminophen  (TYLENOL ) tablet 650 mg (has no administration in time range)    Or  acetaminophen  (TYLENOL ) suppository 650 mg (has no administration in time range)  lactated ringers  bolus 500 mL (500 mLs Intravenous New Bag/Given 07/15/24 1533)                                    Medical Decision Making Amount and/or Complexity of Data Reviewed Labs: ordered. Radiology: ordered.  Risk Prescription drug management. Decision regarding hospitalization.     58 year old male with medical history significant for recent NSTEMI status post cardiac catheterization on 10/26 where he was found to have severe single-vessel CAD with 95% mid LAD stenosis involving a moderate caliber D2 branch with acute angulation, subsequently status post PCI to the proximal/mid LAD and D2, now on aspirin and Brilinta presenting to the emergency department with chest pain and shortness of breath.  The patient has had a dull ache since his catheterization, described as a 1-2 out of 10.  Today he developed worsening pain, not as severe as when he had his MI last week but  persistent enough and severe enough to cause him concern resulting in his presentation to the emergency department.  Pain has been intermittent, described as substernal, dull ache in sensation, not radiating to the back, no ripping or tearing component.  He has had also mild shortness of breath as well.  He endorses a bilateral tightness in his neck, denies any nausea, vomiting, diaphoresis.  No fevers or chills.  He is currently pain-free.  On arrival, the patient was afebrile, tachycardic heart rate 102, not tachypneic RR 20, BP initially 97/48, dropped to 76/48, patient was administered a 500 cc IV fluid bolus.  He was saturating 100% on room air.  Initial presentation of chest pain status post cardiac catheterization, symptoms have been intermittent, more severe today, now currently resolved on my evaluation.  EKG: Sinus tachycardia, ventricular rate 103, no acute ischemic changes,  Chest x-ray: No acute cardiopulmonary disease, no significant widening of the mediastinum or pleural effusion, pulmonary edema or enlarged heart.  Patient chest pain not ripping or tearing, low concern for aortic dissection at this time.  Patient dyspnea that is intermittent could be a side effect of his Brilinta prescription.  Labs: Serum creatinine 2.28, increased from baseline concerning for AKI, CBC without a leukocytosis, evidence of hemoconcentration with a hemoglobin of 17.6, initial cardiac troponin 90, repeat pending.  In the setting the patient's symptoms, cardiology was consulted, seen by Dr. Verlin and the patient was subsequently admitted to the cardiology service in stable condition.     Final diagnoses:  Nonspecific chest pain  AKI (acute kidney injury)    ED Discharge Orders     None          Jerrol Agent, MD 07/15/24 1754    Jerrol Agent, MD 07/15/24 1755

## 2024-07-15 NOTE — ED Provider Triage Note (Signed)
 Emergency Medicine Provider Triage Evaluation Note  Levi Johnston , a 58 y.o. male  was evaluated in triage.  Pt complains of chest pain since this morning.  Patient reports the chest pain feels like it starts in his throat and radiates down the center of his chest.  Patient had an NSTEMI last week seen at [facility].  Patient reports the pain feels similar to when he had a NSTEMI but not as severe.  Review of Systems  Positive: Chest pain, shortness of breath Negative: Dizziness, syncope, nausea, vomiting, abd pain  Physical Exam  BP 90/66   Pulse (!) 105   Temp 98.3 F (36.8 C) (Oral)   Resp 20   SpO2 99%  Gen:   Awake, no distress   Resp:  Normal effort lungs are clear to auscultation MSK:   Moves extremities without difficulty  Other:    Medical Decision Making  Medically screening exam initiated at 3:12 PM.  Appropriate orders placed.  Timothee Gali was informed that the remainder of the evaluation will be completed by another provider, this initial triage assessment does not replace that evaluation, and the importance of remaining in the ED until their evaluation is complete.  Patient sitting comfortably in triage room in no acute distress.  Patient reports chest pain increasing in severity since this morning.  Patient reports pain starts at his throat and radiates down the center of his chest.  Patient reports taking a nitro prior to arrival and all medication that was prescribed to him at discharge.  Patient recently had a cath due to NSTEMI.  Patient advises the pain feels similar to the pain he had with the NSTEMI but not as severe.   Myriam Fonda RAMAN, [place] 07/15/24 8485

## 2024-07-16 ENCOUNTER — Other Ambulatory Visit (HOSPITAL_COMMUNITY): Payer: Self-pay

## 2024-07-16 ENCOUNTER — Other Ambulatory Visit: Payer: Self-pay

## 2024-07-16 DIAGNOSIS — R072 Precordial pain: Secondary | ICD-10-CM

## 2024-07-16 DIAGNOSIS — N179 Acute kidney failure, unspecified: Secondary | ICD-10-CM | POA: Insufficient documentation

## 2024-07-16 LAB — CBC
HCT: 48.4 % (ref 39.0–52.0)
Hemoglobin: 16.6 g/dL (ref 13.0–17.0)
MCH: 29.2 pg (ref 26.0–34.0)
MCHC: 34.3 g/dL (ref 30.0–36.0)
MCV: 85.1 fL (ref 80.0–100.0)
Platelets: 322 K/uL (ref 150–400)
RBC: 5.69 MIL/uL (ref 4.22–5.81)
RDW: 14.4 % (ref 11.5–15.5)
WBC: 10.1 K/uL (ref 4.0–10.5)
nRBC: 0 % (ref 0.0–0.2)

## 2024-07-16 LAB — BASIC METABOLIC PANEL WITH GFR
Anion gap: 13 (ref 5–15)
BUN: 38 mg/dL — ABNORMAL HIGH (ref 6–20)
CO2: 24 mmol/L (ref 22–32)
Calcium: 9.1 mg/dL (ref 8.9–10.3)
Chloride: 101 mmol/L (ref 98–111)
Creatinine, Ser: 2.03 mg/dL — ABNORMAL HIGH (ref 0.61–1.24)
GFR, Estimated: 38 mL/min — ABNORMAL LOW (ref >60)
Glucose, Bld: 110 mg/dL — ABNORMAL HIGH (ref 70–99)
Potassium: 4.5 mmol/L (ref 3.5–5.1)
Sodium: 138 mmol/L (ref 135–145)

## 2024-07-16 MED ORDER — PRASUGREL HCL 10 MG PO TABS
10.0000 mg | ORAL_TABLET | Freq: Every day | ORAL | 11 refills | Status: DC
  Filled 2024-07-16: qty 30, 30d supply, fill #0

## 2024-07-16 NOTE — Plan of Care (Signed)

## 2024-07-16 NOTE — Progress Notes (Signed)
  Progress Note  Patient Name: Levi Johnston Date of Encounter: 07/16/2024 [facility] Cardiologist: Lonni Cash, MD   Interval Summary    Feeling better this morning.  Had long discussion with him.  Had intermittent twinges of chest discomfort.  Now improved. Grew up in [place] .  Hard-working.  Vital Signs Vitals:   07/15/24 2223 07/15/24 2236 07/16/24 0512 07/16/24 0844  BP:  (!) 119/96 96/64 114/74  Pulse:  (!) 106 90 82  Resp:  16 18 16   Temp:  98.5 F (36.9 C) 98.9 F (37.2 C) 97.8 F (36.6 C)  TempSrc:  Oral Oral Oral  SpO2:  99% 96%   Weight: 94.5 kg     Height: 6' (1.829 m)       Intake/Output Summary (Last 24 hours) at 07/16/2024 1135 Last data filed at 07/15/2024 2320 Gross per 24 hour  Intake 740 ml  Output --  Net 740 ml      07/15/2024   10:23 PM 07/08/2024    5:20 PM 07/08/2024    3:54 PM  Last 3 Weights  Weight (lbs) 208 lb 6.4 oz 205 lb 208 lb 5.4 oz  Weight (kg) 94.53 kg 92.987 kg 94.5 kg      Telemetry/ECG  Sinus rhythm no adverse arrhythmias- Personally Reviewed  Physical Exam  GEN: No acute distress.   Neck: No JVD Cardiac: Right radial cath site normal, RRR, no murmurs, rubs, or gallops.  Respiratory: Clear to auscultation bilaterally. GI: Soft, nontender, non-distended  MS: No edema  Assessment & Plan   58 year old with recent cardiac catheterization on 07/10/2024 with severe mid LAD stenosis involving a moderate caliber diagonal branch treated with bifurcation stenting with EF 65% here secondary to chest pain and dyspnea.  Over the past 4 days has been feeling this.  Anxious after his discharge.  Cut back to 5 cigarettes a day down from 2 packs a day.  EKG showed subtle ST abnormality in the inferior leads  Creatinine on arrival 2.5 up from 1.2  CAD with angina - Stopped Brilinta, changed to Effient 10 mg once a day. -Continuing with aspirin and Crestor as well. -Troponin 90 down to 87.  Expected  downtrending.  No evidence of stent thrombosis.  Hemoglobin 16.6 creatinine today 2.03 down from 2.5 after IV fluids.  Avoiding nephrotoxic agents.  No ARB.  Continue to encourage p.o. intake.  Recheck basic metabolic profile as outpatient. -I am comfortable with discharge.  There is no evidence of stent thrombosis.  Troponin is trending downward as to be expected.  He understands that there may be some mild myocardial inflammation present/irritation that can cause transient discomfort.  We discussed at length.  Encouraged cardiac rehab for him.    Hyperlipidemia - Continue with rosuvastatin 40 mg once a day.  Tobacco use - Continue to encourage cessation.  Nicotine  patch.  Okay with discharge.     For questions or updates, please contact [facility] Please consult [email or web address] for contact info under         Signed, Oneil Parchment, MD

## 2024-07-16 NOTE — Discharge Summary (Cosign Needed)
 Discharge Summary   Patient ID: Levi Johnston MRN: [id number]; DOB: Jun 22, 1966  Admit date: 07/15/2024 Discharge date: 07/16/2024  PCP:  Chandra Toribio POUR, MD   [facility] Providers Cardiologist:  Lonni Cash, MD       Discharge Diagnoses  Principal Problem:   Coronary artery disease with stable angina pectoris Active Problems:   Mixed hyperlipidemia   Hypertension   Prediabetes   Tobacco use disorder, continuous   AKI (acute kidney injury)   Diagnostic Studies/Procedures  None _____________   History of Present Illness   Levi Johnston is a 58 y.o. male with a CAD with recent NSTEMI in 06/2024 s/p DES to LAD and DES to 2nd Diag, hypertension, hyperlipidemia, prediabetes, and tobacco abuse.   He was recently admitted from 07/08/2024 to 07/11/2024 for a NSTEMI after presenting with chest pain with associated shortness of breath and sweating. Echo showed LVEF of 60-65% with hypokinesis of the mid and distal anterior septum and mid inferoseptal segment, normal RV function, and mild chordal SAM with only trivial MR. LHC showed 95% stenosis of mid LAD and 70% stenosis of ostial D2 as well as mild disease of OM2 and RCA. He underwent complex PCI with DES to LAD and DES to D2. He was started on DAPT with Aspirin and Brilitna as well as a high-intensity statin.  He presented back to the ED on 07/15/2024 for recurrent chest pain and dyspnea for the past 4 days since discharge. EKG showed chronic subtle ST abnormalities in the inferior leads but no acute ischemic changes. High-sensitivity troponin was mildly elevated at 90 >> 87 (down from 805 during recent admission). WBC 9.9, Hgb 17.6, Plts 308. Na 136, K 4.6, Glucose 114, BUN 33, Cr 2.28. Chest x-ray showed no acute findings. He was treated with IV fluids for AKI and was admitted for further monitoring/ observation.   Hospital Course   Consultants: None   Chest Pain CAD with Recent NSTEMI Patient was recently admitted  for a NSTEMI and underwent DES to LAD and DES to 2nd Diag on 07/10/2024. He presented back to the ED on 07/15/2024 with chest pain and dyspnea. EKG showed no acute ischemic changes. High-sensitivity troponin was only mildly elevated at 90 >> 87 (down from 805 during recent admission). Brilinta was stopped and he was loaded with Effient instead with improvement. He was feeling much better on morning of discharge. He reported feeling anxious since recent discharge which is likely contributing to symptoms. He is felt to be stable for discharge. Will continue DAPT with Aspirin 81mg  daily and Effient 10mg  daily. Continue Crestor 40mg  daily. He already has close follow-up scheduled for next week.   Hypertension BP was soft on admission and he was noted to have AKI. Home Olmesartan -HCTZ was held and he was treated with IV fluids. Will discontinue Olmesartan -HCTZ at discharge. Advised patient to monitor BP at home and let us  know if BP consistently >140/100.   Hyperlipidemia Lipid panel during recent admission: Total Cholesterol 242, Triglycerides 175, HDL 41, LDL 166. Lipoprotein (a) 32.7. LDL goal <70 given CAD. Continue Crestor 40mg  daily. Will need repeat lipid panel and LFTs in 6-8 weeks.  Prediabetes Hemoglobin A1c 6.1% in 06/2024. Continue lifestyle modifications.   AKI Creatinine 2.28 on admission and peaked at 2.50. Baseline around 1.2. His Olmesartan -HCTZ was held and he was treated with IV fluids. Creatinine improved to 2.03 on day of discharge. Olmesartan -HCTZ discontinued at discharge. Will need repeat BMET at follow-up visit next week.   Tobacco Abuse  He is trying to quit after recent admission for NSTEMI. He is down from 2 packs per day to 5 cigarrettes per day which is a huge improvement. Complete cessation was again encouraged.   Patient was seen and examined by Dr. Jeffrie and determined to be stable for discharge. Outpatient follow-up arranged. Medications as below.      _____________  Discharge Vitals Blood pressure (!) 116/100, pulse 82, temperature 98.4 F (36.9 C), temperature source Oral, resp. rate 18, height 6' (1.829 m), weight 94.5 kg, SpO2 96%.  Filed Weights   07/15/24 2223  Weight: 94.5 kg    Labs & Radiologic Studies  CBC Recent Labs    07/15/24 1507 07/15/24 1512 07/16/24 0448  WBC 9.9  --  10.1  HGB 17.6* 18.4* 16.6  HCT 52.5* 54.0* 48.4  MCV 86.9  --  85.1  PLT 308  --  322   Basic Metabolic Panel Recent Labs    89/68/74 1507 07/15/24 1512 07/16/24 0448  NA 136 139 138  K 4.6 4.5 4.5  CL 100 104 101  CO2 22  --  24  GLUCOSE 114* 114* 110*  BUN 33* 36* 38*  CREATININE 2.28* 2.50* 2.03*  CALCIUM 9.3  --  9.1   Liver Function Tests No results for input(s): AST, ALT, ALKPHOS, BILITOT, PROT, ALBUMIN in the last 72 hours. No results for input(s): LIPASE, AMYLASE in the last 72 hours. High Sensitivity Troponin:   Recent Labs  Lab 07/08/24 1912 07/08/24 2128 07/15/24 1507 07/15/24 1719  TROPONINIHS 594* 805* 90* 87*    No results for input(s): TRNPT in the last 720 hours.  BNP Invalid input(s): POCBNP No results for input(s): PROBNP in the last 72 hours.  No results for input(s): BNP in the last 72 hours.  D-Dimer No results for input(s): DDIMER in the last 72 hours. Hemoglobin A1C No results for input(s): HGBA1C in the last 72 hours. Fasting Lipid Panel No results for input(s): CHOL, HDL, LDLCALC, TRIG, CHOLHDL, LDLDIRECT in the last 72 hours. Lipoprotein (a)  Date/Time Value Ref Range Status  07/09/2024 05:00 AM 32.7 (H) <75.0 nmol/L Final    Comment:    (NOTE) This test was developed and its performance characteristics determined by [facility]. It has not been cleared or approved by the Food and Drug Administration. Note:  Values greater than or equal to 75.0 nmol/L may       indicate an independent risk factor for CHD,       but must be evaluated with caution  when applied       to non-Caucasian populations due to the       influence of genetic factors on Lp(a) across       ethnicities. Performed At: [facility] [address] [place], [place] [id number] Jennette Shorter MD Ey:[id number]     Thyroid  Function Tests No results for input(s): TSH, T4TOTAL, T3FREE, THYROIDAB in the last 72 hours.  Invalid input(s): FREET3 _____________  DG Chest 2 View Result Date: 07/15/2024 EXAM: 2 VIEW(S) XRAY OF THE CHEST 07/15/2024 04:00:00 PM COMPARISON: 07/08/2024 CLINICAL HISTORY: cp cp FINDINGS: LUNGS AND PLEURA: No focal pulmonary opacity. No pulmonary edema. No pleural effusion. No pneumothorax. HEART AND MEDIASTINUM: No acute abnormality of the cardiac and mediastinal silhouettes. BONES AND SOFT TISSUES: No acute osseous abnormality. IMPRESSION: 1. No acute cardiopulmonary disease. Electronically signed by: Lynwood Seip MD 07/15/2024 04:28 PM EDT RP Workstation: [id number]   CARDIAC CATHETERIZATION Result Date: 07/10/2024 Conclusions: Severe single-vessel coronary artery disease  with 95% mid LAD stenosis involving moderate-caliber D2 branch with acute angulation.  There is also 50% proximal LAD disease as well as 30% OM2 and 40% RCA stenoses. Mildly elevated left ventricular filling pressure (LVEDP 20 mmHg). Complex PCI to proximal/mid LAD and D2 using crush technique (Onyx Frontier 3.0 x 34 mm LAD and 2.25 x 12 mm D2 drug-eluting stents) with 0% residual stenosis in the LAD and less than 10% residual stenosis in ostium of D2 with TIMI-3 flow throughout the left coronary artery. Recommendations: Continue cangrelor infusion for 2 hours following administration of ticagrelor at the end of the intervention. Dual antiplatelet therapy with aspirin and ticagrelor for at least 12 months. Aggressive secondary prevention of coronary artery disease, including high intensity statin therapy and smoking cessation. Lonni Hanson, MD [facility]  ECHOCARDIOGRAM COMPLETE Result Date: 07/09/2024    ECHOCARDIOGRAM REPORT   Patient Name:   GEORGIOS KINA Date of Exam: 07/09/2024 Medical Rec #:  [id number]     Height:       72.0 in Accession #:    [id number]    Weight:       205.0 lb Date of Birth:  1966-01-28     BSA:          2.153 m Patient Age:    58 years      BP:           138/93 mmHg Patient Gender: M             HR:           78 bpm. Exam Location:  Inpatient Procedure: 2D Echo (Both Spectral and Color Flow Doppler were utilized during            procedure). Indications:    acute MI  History:        Patient has no prior history of Echocardiogram examinations.                 Risk Factors:Dyslipidemia and Current Smoker.  Sonographer:    Tinnie Barefoot RDCS Referring Phys: [id number] VINCENT A DELGADO  Sonographer Comments: Global longitudinal strain was attempted. IMPRESSIONS  1. Left ventricular ejection fraction, by estimation, is 60 to 65%. The left ventricle has normal function. The left ventricle demonstrates regional wall motion abnormalities (see scoring diagram/findings for description). There is mild asymmetric left ventricular hypertrophy of the septal segment. Left ventricular diastolic parameters are consistent with Grade I diastolic dysfunction (impaired relaxation).  2. Right ventricular systolic function is normal. The right ventricular size is normal. Tricuspid regurgitation signal is inadequate for assessing PA pressure.  3. Mild chordal SAM. The mitral valve is normal in structure. Trivial mitral valve regurgitation. No evidence of mitral stenosis.  4. The aortic valve is normal in structure. Aortic valve regurgitation is not visualized. No aortic stenosis is present.  5. The inferior vena cava is dilated in size with <50% respiratory variability, suggesting right atrial pressure of 15 mmHg. FINDINGS  Left Ventricle: Left ventricular ejection fraction, by estimation, is 60 to 65%. The left ventricle has normal function. The  left ventricle demonstrates regional wall motion abnormalities. The left ventricular internal cavity size was normal in size. There is mild asymmetric left ventricular hypertrophy of the septal segment. Left ventricular diastolic parameters are consistent with Grade I diastolic dysfunction (impaired relaxation).  LV Wall Scoring: The mid and distal anterior septum and mid inferoseptal segment are hypokinetic. Right Ventricle: The right ventricular size is normal. No increase in right ventricular wall thickness.  Right ventricular systolic function is normal. Tricuspid regurgitation signal is inadequate for assessing PA pressure. Left Atrium: Left atrial size was normal in size. Right Atrium: Right atrial size was normal in size. Pericardium: Trivial pericardial effusion is present. Mitral Valve: Mild chordal SAM. The mitral valve is normal in structure. Trivial mitral valve regurgitation. No evidence of mitral valve stenosis. Tricuspid Valve: The tricuspid valve is normal in structure. Tricuspid valve regurgitation is trivial. No evidence of tricuspid stenosis. Aortic Valve: The aortic valve is normal in structure. Aortic valve regurgitation is not visualized. No aortic stenosis is present. Pulmonic Valve: The pulmonic valve was normal in structure. Pulmonic valve regurgitation is not visualized. No evidence of pulmonic stenosis. Aorta: The aortic root is normal in size and structure. Venous: The inferior vena cava is dilated in size with less than 50% respiratory variability, suggesting right atrial pressure of 15 mmHg. IAS/Shunts: No atrial level shunt detected by color flow Doppler.  LEFT VENTRICLE PLAX 2D LVIDd:         4.20 cm   Diastology LVIDs:         2.50 cm   LV e' medial:    6.42 cm/s LV PW:         1.00 cm   LV E/e' medial:  12.3 LV IVS:        1.20 cm   LV e' lateral:   6.20 cm/s LVOT diam:     2.00 cm   LV E/e' lateral: 12.8 LV SV:         89 LV SV Index:   41 LVOT Area:     3.14 cm  RIGHT VENTRICLE              IVC RV Basal diam:  2.50 cm     IVC diam: 2.25 cm RV S prime:     14.30 cm/s TAPSE (M-mode): 2.6 cm LEFT ATRIUM             Index        RIGHT ATRIUM           Index LA diam:        3.40 cm 1.58 cm/m   RA Area:     10.40 cm LA Vol (A2C):   28.8 ml 13.38 ml/m  RA Volume:   21.80 ml  10.13 ml/m LA Vol (A4C):   35.5 ml 16.49 ml/m LA Biplane Vol: 32.5 ml 15.09 ml/m  AORTIC VALVE LVOT Vmax:   152.00 cm/s LVOT Vmean:  105.000 cm/s LVOT VTI:    0.282 m  AORTA Ao Root diam: 3.30 cm Ao Asc diam:  3.50 cm MITRAL VALVE MV Area (PHT): 3.99 cm    SHUNTS MV Decel Time: 190 msec    Systemic VTI:  0.28 m MV E velocity: 79.20 cm/s  Systemic Diam: 2.00 cm MV A velocity: 96.70 cm/s MV E/A ratio:  0.82 Soyla Merck MD Electronically signed by Soyla Merck MD Signature Date/Time: 07/09/2024/10:16:58 PM    Final    CT Angio Chest PE W and/or Wo Contrast Result Date: 07/08/2024 CLINICAL DATA:  Pain with deep inspiration EXAM: CT ANGIOGRAPHY CHEST WITH CONTRAST TECHNIQUE: Multidetector CT imaging of the chest was performed using the standard protocol during bolus administration of intravenous contrast. Multiplanar CT image reconstructions and MIPs were obtained to evaluate the vascular anatomy. RADIATION DOSE REDUCTION: This exam was performed according to the departmental dose-optimization program which includes automated exposure control, adjustment of the mA and/or kV according to patient size and/or  use of iterative reconstruction technique. CONTRAST:  80mL OMNIPAQUE  IOHEXOL  350 MG/ML SOLN COMPARISON:  Chest x-ray 07/08/2024, chest CT 06/20/2024 FINDINGS: Cardiovascular: Satisfactory opacification of the pulmonary arteries to the segmental level. No evidence of pulmonary embolism. Normal heart size. No pericardial effusion. Nonaneurysmal aorta. No dissection. Mediastinum/Nodes: No enlarged mediastinal, hilar, or axillary lymph nodes. Thyroid  gland, trachea, and esophagus demonstrate no significant findings.  Lungs/Pleura: Lungs are clear. No pleural effusion or pneumothorax. Upper Abdomen: No acute finding. Musculoskeletal: No chest wall abnormality. No acute or significant osseous findings. Review of the MIP images confirms the above findings. IMPRESSION: Negative. No CT evidence for acute pulmonary embolus or aortic dissection. Clear lung fields. Electronically Signed   By: Luke Bun M.D.   On: 07/08/2024 22:27   DG Chest 2 View Result Date: 07/08/2024 CLINICAL DATA:  Chest pain EXAM: DG CHEST 2V COMPARISON:  CT 06/20/2024 FINDINGS: The heart size and mediastinal contours are within normal limits. Both lungs are clear. Mild degenerative changes of the spine. IMPRESSION: No active cardiopulmonary disease. Electronically Signed   By: Luke Bun M.D.   On: 07/08/2024 19:33   CT CHEST LUNG CA SCREEN LOW DOSE W/O CM Result Date: 06/23/2024 CLINICAL DATA:  Current smoker with 53 pack-year smoking history. Lung cancer screening. EXAM: CT CHEST WITHOUT CONTRAST LOW-DOSE FOR LUNG CANCER SCREENING TECHNIQUE: Multidetector CT imaging of the chest was performed following the standard protocol without IV contrast. RADIATION DOSE REDUCTION: This exam was performed according to the departmental dose-optimization program which includes automated exposure control, adjustment of the mA and/or kV according to patient size and/or use of iterative reconstruction technique. COMPARISON:  CT chest dated 09/17/2021 FINDINGS: Cardiovascular: Normal heart size. No significant pericardial fluid/thickening. Great vessels are normal in course and caliber. Coronary artery calcification. Mediastinum/Nodes: Imaged thyroid  gland without nodules meeting criteria for imaging follow-up by size. Normal esophagus. No pathologically enlarged axillary, supraclavicular, mediastinal, or hilar lymph nodes. Lungs/Pleura: The central airways are patent. Trace secretions within the trachea. Mild centrilobular emphysema. Scattered pulmonary nodules  measuring up to 4.3 mm peripheral right lower lobe (9:122). No pneumothorax. No pleural effusion. Upper abdomen: Diffuse parenchymal hypoattenuation can be seen with hepatic steatosis. Musculoskeletal: No acute or abnormal lytic or blastic osseous lesions. IMPRESSION: 1. Lung-RADS 2, benign appearance or behavior. Continue annual screening with low-dose chest CT without contrast in 12 months. 2. Hepatic steatosis. 3. Emphysema (ICD10-J43.9). Coronary artery calcifications. Assessment for potential risk factor modification, dietary therapy or pharmacologic therapy may be warranted, if clinically indicated. Electronically Signed   By: Limin  Xu M.D.   On: 06/23/2024 12:22    Disposition Pt is being discharged home today in good condition.  Follow-up Plans & Appointments  Follow-up Information     Goodrich, Callie E, PA-C Follow up.   Specialty: Cardiology Why: Hospital follow-up with Cardiology scheduled for 07/21/2024 at 1:55pm. Please arrive 20 minutes early for check-in. If this date/ time does not work for you, please call our office to reschedule. Contact information: [address] [place] [id number] [phone]                Discharge Instructions     Diet - low sodium heart healthy   Complete by: As directed    Increase activity slowly   Complete by: As directed        Discharge Medications Allergies as of 07/16/2024       Reactions   Penicillins Other (See Comments)   Unknown childhood reaction Has patient had a  PCN reaction causing immediate rash, facial/tongue/throat swelling, SOB or lightheadedness with hypotension: Unknown Has patient had a PCN reaction causing severe rash involving mucus membranes or skin necrosis: Unknown Has patient had a PCN reaction that required hospitalization: Unknown Has patient had a PCN reaction occurring within the last 10 years: Unknown If all of the above answers are NO, then may proceed with Cephalosporin use. Other  Reaction(s): Unknown Unknown childhood reaction, Has patient had a PCN reaction causing immediate rash, facial/tongue/throat swelling, SOB or lightheadedness with hypotension: Unknown, Has patient had a PCN reaction causing severe rash involving mucus membranes or skin necrosis: Unknown, Has patient had a PCN reaction that required hospitalization: Unknown, Has patient had a PCN reaction occurring within the last 10 years: Unknown, If all of the above answers are NO, then may proceed with Cephalosporin use.        Medication List     STOP taking these medications    Brilinta 90 MG Tabs tablet Generic drug: ticagrelor   olmesartan -hydrochlorothiazide 20-12.5 MG tablet Commonly known as: BENICAR  HCT       TAKE these medications    aspirin EC 81 MG tablet Take 1 tablet (81 mg total) by mouth daily. Swallow whole.   nicotine  14 mg/24hr patch Commonly known as: NICODERM CQ  - dosed in mg/24 hours Place 1 patch (14 mg total) onto the skin daily.   nicotine  polacrilex 2 MG lozenge Commonly known as: COMMIT Take 1 lozenge (2 mg total) by mouth as needed for smoking cessation.   nitroGLYCERIN 0.4 MG SL tablet Commonly known as: NITROSTAT Place 1 tablet (0.4 mg total) under the tongue every 5 (five) minutes x 3 doses as needed for chest pain. Do NOT take if you have taken Viagra  within the last 24 hours.   prasugrel 10 MG Tabs tablet Commonly known as: EFFIENT Take 1 tablet (10 mg total) by mouth daily. Start taking on: July 17, 2024   rosuvastatin 40 MG tablet Commonly known as: CRESTOR Take 1 tablet (40 mg total) by mouth daily.   sildenafil  50 MG tablet Commonly known as: Viagra  Take 0.5-1 tablets (25-50 mg total) by mouth daily as needed for erectile dysfunction.         Outstanding Labs/Studies  Repeat BMET at follow-up visit.   Duration of Discharge Encounter: APP Time: 20 minutes   Signed, Callie E Goodrich, PA-C 07/16/2024, 2:14 PM  Personally seen  and examined. Agree with above.  58 year old with recent cardiac catheterization on 07/10/2024 with severe mid LAD stenosis involving a moderate caliber diagonal branch treated with bifurcation stenting with EF 65% here secondary to chest pain and dyspnea.  Diagnostic Dominance: Right  Intervention       Over the past 4 days has been feeling this.  Anxious after his discharge.  Cut back to 5 cigarettes a day down from 2 packs a day.   EKG showed subtle ST abnormality in the inferior leads   Creatinine on arrival 2.5 up from 1.2   CAD with angina (EF 65%) - Stopped Brilinta, changed to Effient 10 mg once a day. -Continuing with aspirin and Crestor as well. -Troponin 90 down to 87.  Expected downtrending.  No evidence of stent thrombosis.  Hemoglobin 16.6 creatinine today 2.03 down from 2.5 after IV fluids.  Avoiding nephrotoxic agents.  No ARB.  Continue to encourage p.o. intake.  Recheck basic metabolic profile as outpatient. -I am comfortable with discharge.  There is no evidence of stent thrombosis.  Troponin is trending  downward as to be expected.  He understands that there may be some mild myocardial inflammation present/irritation that can cause transient discomfort.  We discussed at length.  Encouraged cardiac rehab for him.    Hyperlipidemia - Continue with rosuvastatin 40 mg once a day.   Tobacco use - Continue to encourage cessation.  Nicotine  patch.   Okay with discharge. Time: 30 minutes spent with patient review of cardiac catheterization, EKGs, lab work discussion/questions answered  Oneil Parchment, MD

## 2024-07-16 NOTE — Discharge Instructions (Signed)
 Medication Changes:- - STOP Brilinta and START Prasugrel (Effient) 10mg  once daily. CONTINUE the Aspirin 81mg  once daily.  - STOP Olmesartan -Hydrochlorothiazide.  Please continue to monitor your blood pressure at home and let us  know if your BP is consistently >140/100.

## 2024-07-21 ENCOUNTER — Other Ambulatory Visit: Payer: Self-pay

## 2024-07-21 ENCOUNTER — Ambulatory Visit: Attending: Student | Admitting: Student

## 2024-07-21 ENCOUNTER — Encounter: Payer: Self-pay | Admitting: Student

## 2024-07-21 VITALS — BP 124/80 | HR 90 | Ht 72.0 in | Wt 209.0 lb

## 2024-07-21 DIAGNOSIS — R5383 Other fatigue: Secondary | ICD-10-CM | POA: Diagnosis not present

## 2024-07-21 DIAGNOSIS — I252 Old myocardial infarction: Secondary | ICD-10-CM | POA: Insufficient documentation

## 2024-07-21 DIAGNOSIS — N179 Acute kidney failure, unspecified: Secondary | ICD-10-CM | POA: Insufficient documentation

## 2024-07-21 DIAGNOSIS — I10 Essential (primary) hypertension: Secondary | ICD-10-CM | POA: Diagnosis not present

## 2024-07-21 DIAGNOSIS — R0683 Snoring: Secondary | ICD-10-CM

## 2024-07-21 DIAGNOSIS — I2510 Atherosclerotic heart disease of native coronary artery without angina pectoris: Secondary | ICD-10-CM

## 2024-07-21 DIAGNOSIS — R7303 Prediabetes: Secondary | ICD-10-CM

## 2024-07-21 DIAGNOSIS — E785 Hyperlipidemia, unspecified: Secondary | ICD-10-CM | POA: Diagnosis not present

## 2024-07-21 NOTE — Patient Instructions (Signed)
 Medication Instructions:  Your physician recommends that you continue on your current medications as directed. Please refer to the Current Medication list given to you today. *If you need a refill on your cardiac medications before your next appointment, please call your pharmacy*  Lab Work: TODAY-BMET  2 MONTHS FASTING LIPIDS & LFTS If you have labs (blood work) drawn today and your tests are completely normal, you will receive your results only by: MyChart Message (if you have MyChart) OR A paper copy in the mail If you have any lab test that is abnormal or we need to change your treatment, we will call you to review the results.  Testing/Procedures: [place] Sleep Study   Follow-Up: At [facility], you and your health needs are our priority.  As part of our continuing mission to provide you with exceptional heart care, our providers are all part of one team.  This team includes your primary Cardiologist (physician) and Advanced Practice Providers or APPs (Physician Assistants and Nurse Practitioners) who all work together to provide you with the care you need, when you need it.  Your next appointment:   3 month(s)  Provider:   Lonni Cash, MD  or Aline Door, PA  We recommend signing up for the patient portal called MyChart.  Sign up information is provided on this After Visit Summary.  MyChart is used to connect with patients for Virtual Visits (Telemedicine).  Patients are able to view lab/test results, encounter notes, upcoming appointments, etc.  Non-urgent messages can be sent to your provider as well.   To learn more about what you can do with MyChart, go to [email or web address].   Other Instructions

## 2024-07-22 ENCOUNTER — Ambulatory Visit: Payer: Self-pay | Admitting: Student

## 2024-07-22 LAB — BASIC METABOLIC PANEL WITH GFR
BUN/Creatinine Ratio: 18 (ref 9–20)
BUN: 21 mg/dL (ref 6–24)
CO2: 23 mmol/L (ref 20–29)
Calcium: 9.6 mg/dL (ref 8.7–10.2)
Chloride: 100 mmol/L (ref 96–106)
Creatinine, Ser: 1.18 mg/dL (ref 0.76–1.27)
Glucose: 83 mg/dL (ref 70–99)
Potassium: 4.5 mmol/L (ref 3.5–5.2)
Sodium: 138 mmol/L (ref 134–144)
eGFR: 72 mL/min/1.73 (ref >59)

## 2024-08-02 ENCOUNTER — Telehealth (HOSPITAL_COMMUNITY): Payer: Self-pay

## 2024-08-02 NOTE — Telephone Encounter (Signed)
 Attempted to call patient regarding scheduling cardiac rehab- no answer, left message. Confirmed patient read previous MyChart message.

## 2024-08-02 NOTE — Telephone Encounter (Signed)
 Pt insurance is active and benefits verified through [facility]. Co-pay $0, DED $0/$0 met, out of pocket $0/$0 met, co-insurance 0%. No pre-authorization required. 08/02/2024 @ 12:48pm, spoke with Chesley, REF# [id number].  TCR/ICR? TCR 36 Visit(date of service)limitation? No Can multiple codes be used on the same date of service/visit?(IF ITS A LIMIT) Yes  Is this a lifetime maximum or an annual maximum? Annual Has the member used any of these services to date? No Is there a time limit (weeks/months) on start of program and/or program completion? 6 months from 07/08/24

## 2024-08-03 ENCOUNTER — Ambulatory Visit: Admitting: Family Medicine

## 2024-08-08 ENCOUNTER — Telehealth (HOSPITAL_COMMUNITY): Payer: Self-pay

## 2024-08-08 NOTE — Telephone Encounter (Signed)
 Patient's daughter called back to get patient scheduled in the Cardiac Rehab Program. Patient will come in for orientation on 12/16 and will attend the 12:30 exercise class.  Pensions consultant.

## 2024-08-15 ENCOUNTER — Other Ambulatory Visit: Payer: Self-pay

## 2024-08-15 MED ORDER — PRASUGREL HCL 10 MG PO TABS: 10.0000 mg | ORAL_TABLET | Freq: Every day | ORAL | 11 refills | Status: AC

## 2024-08-17 ENCOUNTER — Encounter: Payer: Self-pay | Admitting: Family Medicine

## 2024-08-17 ENCOUNTER — Ambulatory Visit: Admitting: Family Medicine

## 2024-08-17 VITALS — BP 131/83 | HR 82 | Ht 72.0 in | Wt 212.0 lb

## 2024-08-17 DIAGNOSIS — I10 Essential (primary) hypertension: Secondary | ICD-10-CM | POA: Diagnosis not present

## 2024-08-17 DIAGNOSIS — F5101 Primary insomnia: Secondary | ICD-10-CM

## 2024-08-17 DIAGNOSIS — I2510 Atherosclerotic heart disease of native coronary artery without angina pectoris: Secondary | ICD-10-CM

## 2024-08-17 DIAGNOSIS — F17209 Nicotine dependence, unspecified, with unspecified nicotine-induced disorders: Secondary | ICD-10-CM | POA: Diagnosis not present

## 2024-08-17 MED ORDER — ASPIRIN 81 MG PO TBEC: 81.0000 mg | DELAYED_RELEASE_TABLET | Freq: Every day | ORAL | 3 refills | Status: AC

## 2024-08-17 MED ORDER — ROSUVASTATIN CALCIUM 40 MG PO TABS: 40.0000 mg | ORAL_TABLET | Freq: Every day | ORAL | 3 refills | Status: AC

## 2024-08-17 MED ORDER — DOXEPIN HCL 10 MG PO CAPS: 10.0000 mg | ORAL_CAPSULE | Freq: Every day | ORAL | 3 refills | Status: AC

## 2024-08-17 MED ORDER — NICOTINE POLACRILEX 4 MG MT LOZG: 4.0000 mg | LOZENGE | OROMUCOSAL | 3 refills | Status: AC | PRN

## 2024-08-17 NOTE — Progress Notes (Signed)
 Established Patient Office Visit  Subjective   Patient ID: Levi Johnston, male    DOB: 06-Apr-1966  Age: 58 y.o. MRN: [id number]  Chief Complaint  Patient presents with   Hospitalization Follow-up    HPI  Subjective - Follow up after recent cardiac event with stent placement. Patient reports a scare and wants to be more focused on his health. - Previously experienced minor chest pains and shortness of breath, which he attributed to medication side effects (Brilinta  and a blood pressure pill). These symptoms resolved after the medications were stopped in the hospital. - Reports occasional, very minor, fleeting chest sensations, not painful enough to warrant using nitroglycerin . Describes them as less than a 3-4/10 pain. - Reports longstanding sleep problems, same as usual. Has difficulty falling asleep, often staying up until midnight or 1:00 AM and waking at 5:00 or 6:00 AM. His mind races at night. Denies trying any over-the-counter or prescription sleep aids in the past. - Reports reducing smoking from 1.5-2 packs per day to approximately 0.5 packs per day since his heart attack. He is proud of this reduction and motivated to quit. He used nicotine  patches for a short time after hospitalization but discontinued them. Has not tried nicotine  lozenges. Reduction was achieved by being more mindful before smoking. - Reports significant reduction in fast food consumption since hospitalization.  Medications Current medications include baby aspirin , prasugrel  (Effient ), rosuvastatin  (Crestor ), and sublingual nitroglycerin  PRN. Was previously on Brilinta  and an olmesartan /hydrochlorothiazide combination pill, which were stopped. He was counseled on the use of nitroglycerin  for chest pain. He has a supply of nitroglycerin  and does not need a refill at this time. He was counseled on proper storage to maintain potency.  PMH, PSH, FH, Social Hx PMHx: Recent myocardial infarction treated with two  coronary stents. History of hypertension. PSH: Coronary stent placement. FH: A family member had a poor reaction to bupropion  (Wellbutrin ). Social Hx: Reports smoking cigarettes, has cut down from 1.5-2 packs/day to 0.5 packs/day. Denies alcohol use, except for occasional drinks on vacation. Lives alone. Daughter visits daily.  ROS Cardiovascular: Reports resolution of chest pain and shortness of breath after medication change. Denies recent significant chest pain. Denies nitroglycerin  use. Neurological/Psychiatric: Reports chronic insomnia with racing thoughts.      The ASCVD Risk score (Arnett DK, et al., 2019) failed to calculate for the following reasons:   Risk score cannot be calculated because patient has a medical history suggesting prior/existing ASCVD  Health Maintenance Due  Topic Date Due   Hepatitis C Screening  Never done   Pneumococcal Vaccine: 50+ Years (1 of 2 - PCV) Never done   Hepatitis B Vaccines 19-59 Average Risk (1 of 3 - 19+ 3-dose series) Never done   COVID-19 Vaccine (1 - 2025-26 season) Never done      Objective:     BP 131/83   Pulse 82   Ht 6' (1.829 m)   Wt 212 lb (96.2 kg)   SpO2 97%   BMI 28.75 kg/m    Physical Exam Gen: alert, oriented Pulm: no respiratory distress Psych: pleasant affect   No results found for any visits on 08/17/24.      Assessment & Plan:   Coronary artery disease involving native coronary artery of native heart, unspecified whether angina present Assessment & Plan: Status post-myocardial infarction with stent placement. Chest pain and dyspnea symptoms resolved after discontinuing Brilinta  and olmesartan /HCTZ. Now on aspirin , Effient , and Crestor . - Continue current cardiac medications. - Cardiac rehab screening  is scheduled for the 16th.   Primary insomnia Assessment & Plan: Reports chronic difficulty with sleep initiation secondary to racing thoughts. Has not tried previous treatments. He is interested  in trying a non-habit-forming medication and wants to avoid anything that feels too strong or causes significant daytime sedation. Discussed OTC options (melatonin, L-theanine, L-glycine), Remelteon, and doxepin . Bupropion  was discussed but he has concerns due to a family member's negative reaction. - Start doxepin  10 mg at bedtime for sleep. Prescription sent to [facility]. - Counseled that it is most effective when taken nightly but can be tried as needed. - Counseled to monitor for daytime grogginess.   Primary hypertension Assessment & Plan: History of HTN. BP medications were stopped in the hospital due to hypotension (70s/30s) and associated acute kidney injury, which has since resolved. BP today was 131/83 on recheck. Goal BP is <130/80. - Will not restart antihypertensive medication at this time. - Patient to monitor BP at home twice daily for 2 weeks (once in AM, once in PM) and bring log to follow-up.   Tobacco use disorder, continuous Assessment & Plan: Patient has successfully reduced smoking from 1.5-2 packs/day to 0.5 packs/day since his MI. Motivated to quit. Chantix was previously discussed with cardiology but held due to medication interactions. Discussed nicotine  replacement therapy. - Sent prescription for nicotine  lozenges to pharmacy. - Counseled on use of nicotine  gum versus lozenges. - Continue efforts to cut down and quit.   Other orders -     Aspirin ; Take 1 tablet (81 mg total) by mouth daily. Swallow whole.  Dispense: 90 tablet; Refill: 3 -     Rosuvastatin  Calcium ; Take 1 tablet (40 mg total) by mouth daily.  Dispense: 90 tablet; Refill: 3 -     Nicotine  Polacrilex; Take 1 lozenge (4 mg total) by mouth as needed for smoking cessation.  Dispense: 100 tablet; Refill: 3 -     Doxepin  HCl; Take 1 capsule (10 mg total) by mouth at bedtime.  Dispense: 30 capsule; Refill: 3   I spent 34 min in the mgmt of this patient  Return in about 6 weeks (around 09/28/2024) for  htn, sleep.    Toribio MARLA Slain, MD

## 2024-08-17 NOTE — Patient Instructions (Signed)
 It was nice to see you today,  We addressed the following topics today: - I would like you to check your blood pressure twice a day for the next couple of weeks, once in the morning and once in the afternoon or evening. Please use the log sheet I provided to record the readings, date, and time. - You can try taking the doxepin 30-60 minutes before you plan to go to sleep. You can use it every night. If you feel groggy or hungover the next day, please let me know. - I have sent the prescription for nicotine  lozenges. You can use these to help you continue to cut down and eventually quit smoking. - Please come back to see me in about six weeks, around the middle of January, to check on your blood pressure, sleep, and smoking cessation progress.  Have a great day,  Rolan Slain, MD

## 2024-08-21 DIAGNOSIS — F5101 Primary insomnia: Secondary | ICD-10-CM | POA: Insufficient documentation

## 2024-08-21 NOTE — Assessment & Plan Note (Signed)
 History of HTN. BP medications were stopped in the hospital due to hypotension (70s/30s) and associated acute kidney injury, which has since resolved. BP today was 131/83 on recheck. Goal BP is <130/80. - Will not restart antihypertensive medication at this time. - Patient to monitor BP at home twice daily for 2 weeks (once in AM, once in PM) and bring log to follow-up.

## 2024-08-21 NOTE — Assessment & Plan Note (Signed)
 Reports chronic difficulty with sleep initiation secondary to racing thoughts. Has not tried previous treatments. He is interested in trying a non-habit-forming medication and wants to avoid anything that feels too strong or causes significant daytime sedation. Discussed OTC options (melatonin, L-theanine, L-glycine), Remelteon, and doxepin . Bupropion  was discussed but he has concerns due to a family member's negative reaction. - Start doxepin  10 mg at bedtime for sleep. Prescription sent to [facility]. - Counseled that it is most effective when taken nightly but can be tried as needed. - Counseled to monitor for daytime grogginess.

## 2024-08-21 NOTE — Assessment & Plan Note (Signed)
 Status post-myocardial infarction with stent placement. Chest pain and dyspnea symptoms resolved after discontinuing Brilinta  and olmesartan /HCTZ. Now on aspirin , Effient , and Crestor . - Continue current cardiac medications. - Cardiac rehab screening is scheduled for the 16th.

## 2024-08-21 NOTE — Assessment & Plan Note (Signed)
 Patient has successfully reduced smoking from 1.5-2 packs/day to 0.5 packs/day since his MI. Motivated to quit. Chantix was previously discussed with cardiology but held due to medication interactions. Discussed nicotine  replacement therapy. - Sent prescription for nicotine  lozenges to pharmacy. - Counseled on use of nicotine  gum versus lozenges. - Continue efforts to cut down and quit.

## 2024-08-26 ENCOUNTER — Telehealth (HOSPITAL_COMMUNITY): Payer: Self-pay

## 2024-08-26 NOTE — Telephone Encounter (Signed)
 Attempted to confirm cardiac rehab orientation date of 08/30/24 @ 1030.  Location, what to bring, footwear, and approximate length of orientation given in message.

## 2024-08-30 ENCOUNTER — Other Ambulatory Visit: Payer: Self-pay | Admitting: Physician Assistant

## 2024-08-30 ENCOUNTER — Encounter (HOSPITAL_COMMUNITY)
Admission: RE | Admit: 2024-08-30 | Discharge: 2024-08-30 | Disposition: A | Discharge status: Home / Self Care | Source: Ambulatory Visit | Attending: Cardiovascular Disease | Admitting: Cardiovascular Disease

## 2024-08-30 ENCOUNTER — Encounter (HOSPITAL_COMMUNITY): Payer: Self-pay

## 2024-08-30 VITALS — BP 152/80 | HR 97 | Ht 72.0 in | Wt 208.6 lb

## 2024-08-30 DIAGNOSIS — Z955 Presence of coronary angioplasty implant and graft: Secondary | ICD-10-CM | POA: Insufficient documentation

## 2024-08-30 DIAGNOSIS — I214 Non-ST elevation (NSTEMI) myocardial infarction: Secondary | ICD-10-CM | POA: Diagnosis present

## 2024-08-30 NOTE — Progress Notes (Addendum)
 Patient here for cardiac rehab orientation. Vital signs are as follows. Initial blood pressure 154/102 heart rate 97. Oxygen saturation 97% on room air. Medications reviewed taking as prescribed. Recheck BP 152/80. Patient is not taking an antihypertensive at this time and has not been checking his blood pressure on a regular basis. Will notify onsite provider Levi Johnston [facility]. Consulted with onsite provider Levi Johnston [facility] told the patient to restart his Olmesartan -hydrochlorothiazide 1/2 table once a day. Per Levi Johnston PAC. have him track BP daily an hour after taking medication. Patient okay to proceed with 6 minute walk test per Levi Johnston PAC.  track to 2 weeks then send cards a mychart with his log  if SBP < 100 or > 145 give us  a phone call   Patient aware and says he will record his blood pressures daily and send them to my chart. Patient reports that he has not had an eye exam in over 20 years. Levi Johnston says he sometimes has difficulty seeing things. Encouraged Levi Johnston to get an eye exam he will talk to his primary care provider for recommendations of an eye doctor.  Will forward today's vital signs from 6 minute walk test to Callie Goodrich [facility] and patient's PCP, Dr Chandra.Hadassah Elpidio Quan RN BSN

## 2024-08-30 NOTE — Progress Notes (Signed)
° °  Patient Name: Levi Johnston  DOB: April 11, 1966 MRN: [id number]  Primary Cardiologist: Lonni Cash, MD  I spoke to the patient today during his cardiac rehab evaluation.  Blood pressure was elevated 152/100 initially then down to 152/82.  Patient is not currently on any blood pressure medication.  He was recently in the hospital and had a reaction to his Brilinta  (shortness of breath).  Switched to Effient  without any further issues.  We have asked him to reinitiate his Benicar  HCT 10/6.25 mg daily (half of his original dose) .  We have asked him to take his blood pressure an hour after morning medications and record for the next 2 weeks.  Please give our office a call if you notice your top number of your blood pressure under 100 mmHg consecutively or the top number over 145 mmHg consecutively.   Next appointment with us  is February 11.  He may call and follow-up if needed before then.  Continue with walk test today and continue with cardiac rehab as scheduled.   Orren LOISE Fabry, PA-C 08/30/2024, 11:51 AM

## 2024-08-30 NOTE — Progress Notes (Signed)
 Cardiac Rehab Medication Review by a Nurse  Does the patient  feel that his/her medications are working for him/her?  yes  Has the patient been experiencing any side effects to the medications prescribed?  no  Does the patient measure his/her own blood pressure or blood glucose at home?  no   Does the patient have any problems obtaining medications due to transportation or finances?   no  Understanding of regimen: good Understanding of indications: good Potential of compliance: good    Nurse comments: Levi Johnston is taking his medications as prescribed and has a good understanding of what his medications are for. Levi Johnston has not started taking doxepin  because he was worried about being dependant on taking the medication. Levi Johnston says he may try to take the medication as he is only sleeping 3 hours a day. Levi Johnston has a blood pressure cuff. Levi Johnston has not been checking his blood pressures on a regular basis. Please see previous documentation as Levi Johnston's blood pressure was elevated today.     Hadassah Gaw Charitie Hinote RN 08/30/2024 10:50 AM

## 2024-08-30 NOTE — Progress Notes (Signed)
 Cardiac Individual Treatment Plan  Patient Details  Name: Levi Johnston MRN: [id number] Date of Birth: 12/01/1965 Referring Provider:   Flowsheet Row CARDIAC REHAB PHASE II ORIENTATION from 08/30/2024 in [facility] [place]  Referring Provider Lonni Cash, MD    Initial Encounter Date:  Flowsheet Row CARDIAC REHAB PHASE II ORIENTATION from 08/30/2024 in [facility]  Date 08/30/24    Visit Diagnosis: 07/08/24 NSTEMI (non-ST elevated myocardial infarction) (HCC)  07/10/24 Status post coronary artery stent placement LAD x 2  Patient's Home Medications on Admission: Current Medications[1]  Past Medical History: Past Medical History:  Diagnosis Date   Anxiety    CAD (coronary artery disease)    Depression    Hyperlipidemia    Hypertension     Tobacco Use: Tobacco Use History[2]  Labs: Review Flowsheet  More data may exist      Latest Ref Rng & Units 09/26/2021 04/04/2024 07/08/2024 07/09/2024 07/15/2024  Labs for ITP Cardiac and Pulmonary Rehab  Cholestrol 0 - 200 mg/dL 778  - 757  - -  LDL (calc) 0 - 99 mg/dL 856  - 833  - -  HDL-C >40 mg/dL 37  - 41  - -  Trlycerides <150 mg/dL 774  - 824  - -  Hemoglobin A1c 4.8 - 5.6 % 6.2  6.3  - 6.1  -  TCO2 22 - 32 mmol/L - - - - 23     Capillary Blood Glucose: No results found for: GLUCAP   Exercise Target Goals: Exercise Program Goal: Individual exercise prescription set using results from initial 6 min walk test and THRR while considering  patients activity barriers and safety.   Exercise Prescription Goal: Initial exercise prescription builds to 30-45 minutes a day of aerobic activity, 2-3 days per week.  Home exercise guidelines will be given to patient during program as part of exercise prescription that the participant will acknowledge.  Activity Barriers & Risk Stratification:  Activity Barriers  & Cardiac Risk Stratification - 08/30/24 1405       Activity Barriers & Cardiac Risk Stratification   Activity Barriers Back Problems;Neck/Spine Problems;Muscular Weakness;Joint Problems;Shortness of Breath    Cardiac Risk Stratification High          6 Minute Walk:  6 Minute Walk     Row Name 08/30/24 1255         6 Minute Walk   Phase Initial     Distance 1560 feet     Walk Time 6 minutes     # of Rest Breaks 0     MPH 3     METS 4.3     RPE 9     Perceived Dyspnea  0     VO2 Peak 15.1     Symptoms No     Resting HR 95 bpm     Resting BP 152/80     Resting Oxygen Saturation  97 %     Exercise Oxygen Saturation  during 6 min walk 99 %     Max Ex. HR 110 bpm     Max Ex. BP 146/80     2 Minute Post BP 150/86        Oxygen Initial Assessment:   Oxygen Re-Evaluation:   Oxygen Discharge (Final Oxygen Re-Evaluation):   Initial Exercise Prescription:  Initial Exercise Prescription - 08/30/24 1100       Date of Initial Exercise RX  and Referring Provider   Date 08/30/24    Referring Provider Lonni Cash, MD    Expected Discharge Date 10/31/24      Recumbant Bike   Level 2    RPM 60    Watts 69    Minutes 15    METs 4.3      Recumbant Elliptical   Level 2    RPM 60    Watts 90    Minutes 15    METs 4.3      Prescription Details   Frequency (times per week) 2    Duration Progress to 30 minutes of continuous aerobic without signs/symptoms of physical distress      Intensity   THRR 40-80% of Max Heartrate 57-131    Ratings of Perceived Exertion 11-13    Perceived Dyspnea 0-4      Progression   Progression Continue progressive overload as per policy without signs/symptoms or physical distress.      Resistance Training   Training Prescription Yes    Reps 10-15          Perform Capillary Blood Glucose checks as needed.  Exercise Prescription Changes:   Exercise Comments:   Exercise Goals and Review:   Exercise Goals      Row Name 08/30/24 1058             Exercise Goals   Increase Physical Activity Yes       Intervention Provide advice, education, support and counseling about physical activity/exercise needs.;Develop an individualized exercise prescription for aerobic and resistive training based on initial evaluation findings, risk stratification, comorbidities and participant's personal goals.       Expected Outcomes Short Term: Attend rehab on a regular basis to increase amount of physical activity.;Long Term: Exercising regularly at least 3-5 days a week.;Long Term: Add in home exercise to make exercise part of routine and to increase amount of physical activity.       Increase Strength and Stamina Yes       Intervention Provide advice, education, support and counseling about physical activity/exercise needs.;Develop an individualized exercise prescription for aerobic and resistive training based on initial evaluation findings, risk stratification, comorbidities and participant's personal goals.       Expected Outcomes Short Term: Increase workloads from initial exercise prescription for resistance, speed, and METs.;Short Term: Perform resistance training exercises routinely during rehab and add in resistance training at home;Long Term: Improve cardiorespiratory fitness, muscular endurance and strength as measured by increased METs and functional capacity ( )       Able to understand and use rate of perceived exertion (RPE) scale Yes       Intervention Provide education and explanation on how to use RPE scale       Expected Outcomes Short Term: Able to use RPE daily in rehab to express subjective intensity level;Long Term:  Able to use RPE to guide intensity level when exercising independently       Knowledge and understanding of Target Heart Rate Range (THRR) Yes       Intervention Provide education and explanation of THRR including how the numbers were predicted and where they are located for reference        Expected Outcomes Short Term: Able to state/look up THRR;Long Term: Able to use THRR to govern intensity when exercising independently;Short Term: Able to use daily as guideline for intensity in rehab       Understanding of Exercise Prescription Yes       Intervention Provide  education, explanation, and written materials on patient's individual exercise prescription       Expected Outcomes Short Term: Able to explain program exercise prescription;Long Term: Able to explain home exercise prescription to exercise independently          Exercise Goals Re-Evaluation :   Discharge Exercise Prescription (Final Exercise Prescription Changes):   Nutrition:  Target Goals: Understanding of nutrition guidelines, daily intake of sodium 1500mg , cholesterol 200mg , calories 30% from fat and 7% or less from saturated fats, daily to have 5 or more servings of fruits and vegetables.  Biometrics:  Pre Biometrics - 08/30/24 1130       Pre Biometrics   Waist Circumference 43 inches    Hip Circumference 41 inches    Waist to Hip Ratio 1.05 %    Triceps Skinfold 5 mm    % Body Fat 24.1 %    Grip Strength 40 kg    Flexibility 15.5 in    Single Leg Stand 30 seconds           Nutrition Therapy Plan and Nutrition Goals:   Nutrition Assessments:  MEDIFICTS Score Key: >=70 Need to make dietary changes  40-70 Heart Healthy Diet <= 40 Therapeutic Level Cholesterol Diet    Picture Your Plate Scores: <59 Unhealthy dietary pattern with much room for improvement. 41-50 Dietary pattern unlikely to meet recommendations for good health and room for improvement. 51-60 More healthful dietary pattern, with some room for improvement.  >60 Healthy dietary pattern, although there may be some specific behaviors that could be improved.    Nutrition Goals Re-Evaluation:   Nutrition Goals Re-Evaluation:   Nutrition Goals Discharge (Final Nutrition Goals Re-Evaluation):   Psychosocial: Target  Goals: Acknowledge presence or absence of significant depression and/or stress, maximize coping skills, provide positive support system. Participant is able to verbalize types and ability to use techniques and skills needed for reducing stress and depression.  Initial Review & Psychosocial Screening:  Initial Psych Review & Screening - 08/30/24 1304       Initial Review   Current issues with Current Stress Concerns;Current Sleep Concerns;History of Depression;Current Depression;Current Anxiety/Panic    Source of Stress Concerns Chronic Illness;Family    Comments Levi Johnston says that he has raised his daughter as a single parent since she was an infant. Levi Johnston is also taking care of her elderly father who lives nearby. Levi Johnston says he has experienced some anxiety and depression  from his recent MI/ Stenting. Levi Johnston says he has been anxious about exercising. Levi Johnston says that he is worriend that he might have another heart attack. Levi Johnston reports that he has difficulty sleeping and has only been sleeping 3 hours a night. Levi Johnston has not started taking doxepin  because he was worried about being dependant on taking the medication. Levi Johnston says he is not interested in counseling at this time. Levi Johnston says he sometimes feels anxious.      Family Dynamics   Good Support System? Yes   Levi Johnston has his daughter and his father for support.     Barriers   Psychosocial barriers to participate in program The patient should benefit from training in stress management and relaxation.      Screening Interventions   Interventions Encouraged to exercise;To provide support and resources with identified psychosocial needs    Expected Outcomes Long Term Goal: Stressors or current issues are controlled or eliminated.;Short Term goal: Identification and review with participant of any Quality of Life or Depression concerns found by scoring the questionnaire.  Quality of Life Scores:  Quality of Life - 08/30/24 1408       Quality  of Life   Select Quality of Life      Quality of Life Scores   Health/Function Pre 23 %    Socioeconomic Pre 26.5 %    Psych/Spiritual Pre 23.64 %    Family Pre 25.13 %    GLOBAL Pre 24.06 %         Scores of 19 and below usually indicate a poorer quality of life in these areas.  A difference of  2-3 points is a clinically meaningful difference.  A difference of 2-3 points in the total score of the Quality of Life Index has been associated with significant improvement in overall quality of life, self-image, physical symptoms, and general health in studies assessing change in quality of life.  PHQ-9: Review Flowsheet  More data exists      08/30/2024 08/17/2024 06/06/2024 03/30/2024 10/03/2021  Depression screen PHQ 2/9  Decreased Interest 1 0 2 1 0  Down, Depressed, Hopeless 1 1 2 1  0  PHQ - 2 Score 2 1 4 2  0  Altered sleeping 1 3 3 3 2   Tired, decreased energy 1 0 2 2 0  Change in appetite 1 2 1 2 1   Feeling bad or failure about yourself  0 0 0 0 0  Trouble concentrating 0 0 1 1 0  Moving slowly or fidgety/restless 0 0 0 1 0  Suicidal thoughts 0 0 0 0 0  PHQ-9 Score 5 6 11  11  3    Difficult doing work/chores Somewhat difficult Somewhat difficult Somewhat difficult Somewhat difficult -    Details       Data saved with a previous flowsheet row definition        Interpretation of Total Score  Total Score Depression Severity:  1-4 = Minimal depression, 5-9 = Mild depression, 10-14 = Moderate depression, 15-19 = Moderately severe depression, 20-27 = Severe depression   Psychosocial Evaluation and Intervention:   Psychosocial Re-Evaluation:   Psychosocial Discharge (Final Psychosocial Re-Evaluation):   Vocational Rehabilitation: Provide vocational rehab assistance to qualifying candidates.   Vocational Rehab Evaluation & Intervention:  Vocational Rehab - 08/30/24 1314       Initial Vocational Rehab Evaluation & Intervention   Assessment shows need for Vocational  Rehabilitation No   Kamryn is semi-retired and does not need vocational rehab at this time.         Education: Education Goals: Education classes will be provided on a weekly basis, covering required topics. Participant will state understanding/return demonstration of topics presented.     Core Videos: Exercise    Move It!  Clinical staff conducted group or individual video education with verbal and written material and guidebook.  Patient learns the recommended Pritikin exercise program. Exercise with the goal of living a long, healthy life. Some of the health benefits of exercise include controlled diabetes, healthier blood pressure levels, improved cholesterol levels, improved heart and lung capacity, improved sleep, and better body composition. Everyone should speak with their doctor before starting or changing an exercise routine.  Biomechanical Limitations Clinical staff conducted group or individual video education with verbal and written material and guidebook.  Patient learns how biomechanical limitations can impact exercise and how we can mitigate and possibly overcome limitations to have an impactful and balanced exercise routine.  Body Composition Clinical staff conducted group or individual video education with verbal and written material and guidebook.  Patient  learns that body composition (ratio of muscle mass to fat mass) is a key component to assessing overall fitness, rather than body weight alone. Increased fat mass, especially visceral belly fat, can put us  at increased risk for metabolic syndrome, type 2 diabetes, heart disease, and even death. It is recommended to combine diet and exercise (cardiovascular and resistance training) to improve your body composition. Seek guidance from your physician and exercise physiologist before implementing an exercise routine.  Exercise Action Plan Clinical staff conducted group or individual video education with verbal and written  material and guidebook.  Patient learns the recommended strategies to achieve and enjoy long-term exercise adherence, including variety, self-motivation, self-efficacy, and positive decision making. Benefits of exercise include fitness, good health, weight management, more energy, better sleep, less stress, and overall well-being.  Medical   Heart Disease Risk Reduction Clinical staff conducted group or individual video education with verbal and written material and guidebook.  Patient learns our heart is our most vital organ as it circulates oxygen, nutrients, white blood cells, and hormones throughout the entire body, and carries waste away. Data supports a plant-based eating plan like the Pritikin Program for its effectiveness in slowing progression of and reversing heart disease. The video provides a number of recommendations to address heart disease.   Metabolic Syndrome and Belly Fat  Clinical staff conducted group or individual video education with verbal and written material and guidebook.  Patient learns what metabolic syndrome is, how it leads to heart disease, and how one can reverse it and keep it from coming back. You have metabolic syndrome if you have 3 of the following 5 criteria: abdominal obesity, high blood pressure, high triglycerides, low HDL cholesterol, and high blood sugar.  Hypertension and Heart Disease Clinical staff conducted group or individual video education with verbal and written material and guidebook.  Patient learns that high blood pressure, or hypertension, is very common in the [place] . Hypertension is largely due to excessive salt intake, but other important risk factors include being overweight, physical inactivity, drinking too much alcohol, smoking, and not eating enough potassium from fruits and vegetables. High blood pressure is a leading risk factor for heart attack, stroke, congestive heart failure, dementia, kidney failure, and premature death.  Long-term effects of excessive salt intake include stiffening of the arteries and thickening of heart muscle and organ damage. Recommendations include ways to reduce hypertension and the risk of heart disease.  Diseases of Our Time - Focusing on Diabetes Clinical staff conducted group or individual video education with verbal and written material and guidebook.  Patient learns why the best way to stop diseases of our time is prevention, through food and other lifestyle changes. Medicine (such as prescription pills and surgeries) is often only a Band-Aid on the problem, not a long-term solution. Most common diseases of our time include obesity, type 2 diabetes, hypertension, heart disease, and cancer. The Pritikin Program is recommended and has been proven to help reduce, reverse, and/or prevent the damaging effects of metabolic syndrome.  Nutrition   Overview of the Pritikin Eating Plan  Clinical staff conducted group or individual video education with verbal and written material and guidebook.  Patient learns about the Pritikin Eating Plan for disease risk reduction. The Pritikin Eating Plan emphasizes a wide variety of unrefined, minimally-processed carbohydrates, like fruits, vegetables, whole grains, and legumes. Go, Caution, and Stop food choices are explained. Plant-based and lean animal proteins are emphasized. Rationale provided for low sodium intake for blood pressure control, low  added sugars for blood sugar stabilization, and low added fats and oils for coronary artery disease risk reduction and weight management.  Calorie Density  Clinical staff conducted group or individual video education with verbal and written material and guidebook.  Patient learns about calorie density and how it impacts the Pritikin Eating Plan. Knowing the characteristics of the food you choose will help you decide whether those foods will lead to weight gain or weight loss, and whether you want to consume more or  less of them. Weight loss is usually a side effect of the Pritikin Eating Plan because of its focus on low calorie-dense foods.  Label Reading  Clinical staff conducted group or individual video education with verbal and written material and guidebook.  Patient learns about the Pritikin recommended label reading guidelines and corresponding recommendations regarding calorie density, added sugars, sodium content, and whole grains.  Dining Out - Part 1  Clinical staff conducted group or individual video education with verbal and written material and guidebook.  Patient learns that restaurant meals can be sabotaging because they can be so high in calories, fat, sodium, and/or sugar. Patient learns recommended strategies on how to positively address this and avoid unhealthy pitfalls.  Facts on Fats  Clinical staff conducted group or individual video education with verbal and written material and guidebook.  Patient learns that lifestyle modifications can be just as effective, if not more so, as many medications for lowering your risk of heart disease. A Pritikin lifestyle can help to reduce your risk of inflammation and atherosclerosis (cholesterol build-up, or plaque, in the artery walls). Lifestyle interventions such as dietary choices and physical activity address the cause of atherosclerosis. A review of the types of fats and their impact on blood cholesterol levels, along with dietary recommendations to reduce fat intake is also included.  Nutrition Action Plan  Clinical staff conducted group or individual video education with verbal and written material and guidebook.  Patient learns how to incorporate Pritikin recommendations into their lifestyle. Recommendations include planning and keeping personal health goals in mind as an important part of their success.  Healthy Mind-Set    Healthy Minds, Bodies, Hearts  Clinical staff conducted group or individual video education with verbal and written  material and guidebook.  Patient learns how to identify when they are stressed. Video will discuss the impact of that stress, as well as the many benefits of stress management. Patient will also be introduced to stress management techniques. The way we think, act, and feel has an impact on our hearts.  How Our Thoughts Can Heal Our Hearts  Clinical staff conducted group or individual video education with verbal and written material and guidebook.  Patient learns that negative thoughts can cause depression and anxiety. This can result in negative lifestyle behavior and serious health problems. Cognitive behavioral therapy is an effective method to help control our thoughts in order to change and improve our emotional outlook.  Additional Videos:  Exercise    Improving Performance  Clinical staff conducted group or individual video education with verbal and written material and guidebook.  Patient learns to use a non-linear approach by alternating intensity levels and lengths of time spent exercising to help burn more calories and lose more body fat. Cardiovascular exercise helps improve heart health, metabolism, hormonal balance, blood sugar control, and recovery from fatigue. Resistance training improves strength, endurance, balance, coordination, reaction time, metabolism, and muscle mass. Flexibility exercise improves circulation, posture, and balance. Seek guidance from your physician and  exercise physiologist before implementing an exercise routine and learn your capabilities and proper form for all exercise.  Introduction to Yoga  Clinical staff conducted group or individual video education with verbal and written material and guidebook.  Patient learns about yoga, a discipline of the coming together of mind, breath, and body. The benefits of yoga include improved flexibility, improved range of motion, better posture and core strength, increased lung function, weight loss, and positive  self-image. Yogas heart health benefits include lowered blood pressure, healthier heart rate, decreased cholesterol and triglyceride levels, improved immune function, and reduced stress. Seek guidance from your physician and exercise physiologist before implementing an exercise routine and learn your capabilities and proper form for all exercise.  Medical   Aging: Enhancing Your Quality of Life  Clinical staff conducted group or individual video education with verbal and written material and guidebook.  Patient learns key strategies and recommendations to stay in good physical health and enhance quality of life, such as prevention strategies, having an advocate, securing a Health Care Proxy and Power of Attorney, and keeping a list of medications and system for tracking them. It also discusses how to avoid risk for bone loss.  Biology of Weight Control  Clinical staff conducted group or individual video education with verbal and written material and guidebook.  Patient learns that weight gain occurs because we consume more calories than we burn (eating more, moving less). Even if your body weight is normal, you may have higher ratios of fat compared to muscle mass. Too much body fat puts you at increased risk for cardiovascular disease, heart attack, stroke, type 2 diabetes, and obesity-related cancers. In addition to exercise, following the Pritikin Eating Plan can help reduce your risk.  Decoding Lab Results  Clinical staff conducted group or individual video education with verbal and written material and guidebook.  Patient learns that lab test reflects one measurement whose values change over time and are influenced by many factors, including medication, stress, sleep, exercise, food, hydration, pre-existing medical conditions, and more. It is recommended to use the knowledge from this video to become more involved with your lab results and evaluate your numbers to speak with your  doctor.   Diseases of Our Time - Overview  Clinical staff conducted group or individual video education with verbal and written material and guidebook.  Patient learns that according to the CDC, 50% to 70% of chronic diseases (such as obesity, type 2 diabetes, elevated lipids, hypertension, and heart disease) are avoidable through lifestyle improvements including healthier food choices, listening to satiety cues, and increased physical activity.  Sleep Disorders Clinical staff conducted group or individual video education with verbal and written material and guidebook.  Patient learns how good quality and duration of sleep are important to overall health and well-being. Patient also learns about sleep disorders and how they impact health along with recommendations to address them, including discussing with a physician.  Nutrition  Dining Out - Part 2 Clinical staff conducted group or individual video education with verbal and written material and guidebook.  Patient learns how to plan ahead and communicate in order to maximize their dining experience in a healthy and nutritious manner. Included are recommended food choices based on the type of restaurant the patient is visiting.   Fueling a Banker conducted group or individual video education with verbal and written material and guidebook.  There is a strong connection between our food choices and our health. Diseases like obesity and  type 2 diabetes are very prevalent and are in large-part due to lifestyle choices. The Pritikin Eating Plan provides plenty of food and hunger-curbing satisfaction. It is easy to follow, affordable, and helps reduce health risks.  Menu Workshop  Clinical staff conducted group or individual video education with verbal and written material and guidebook.  Patient learns that restaurant meals can sabotage health goals because they are often packed with calories, fat, sodium, and sugar.  Recommendations include strategies to plan ahead and to communicate with the manager, chef, or server to help order a healthier meal.  Planning Your Eating Strategy  Clinical staff conducted group or individual video education with verbal and written material and guidebook.  Patient learns about the Pritikin Eating Plan and its benefit of reducing the risk of disease. The Pritikin Eating Plan does not focus on calories. Instead, it emphasizes high-quality, nutrient-rich foods. By knowing the characteristics of the foods, we choose, we can determine their calorie density and make informed decisions.  Targeting Your Nutrition Priorities  Clinical staff conducted group or individual video education with verbal and written material and guidebook.  Patient learns that lifestyle habits have a tremendous impact on disease risk and progression. This video provides eating and physical activity recommendations based on your personal health goals, such as reducing LDL cholesterol, losing weight, preventing or controlling type 2 diabetes, and reducing high blood pressure.  Vitamins and Minerals  Clinical staff conducted group or individual video education with verbal and written material and guidebook.  Patient learns different ways to obtain key vitamins and minerals, including through a recommended healthy diet. It is important to discuss all supplements you take with your doctor.   Healthy Mind-Set    Smoking Cessation  Clinical staff conducted group or individual video education with verbal and written material and guidebook.  Patient learns that cigarette smoking and tobacco addiction pose a serious health risk which affects millions of people. Stopping smoking will significantly reduce the risk of heart disease, lung disease, and many forms of cancer. Recommended strategies for quitting are covered, including working with your doctor to develop a successful plan.  Culinary   Becoming a Corporate Investment Banker conducted group or individual video education with verbal and written material and guidebook.  Patient learns that cooking at home can be healthy, cost-effective, quick, and puts them in control. Keys to cooking healthy recipes will include looking at your recipe, assessing your equipment needs, planning ahead, making it simple, choosing cost-effective seasonal ingredients, and limiting the use of added fats, salts, and sugars.  Cooking - Breakfast and Snacks  Clinical staff conducted group or individual video education with verbal and written material and guidebook.  Patient learns how important breakfast is to satiety and nutrition through the entire day. Recommendations include key foods to eat during breakfast to help stabilize blood sugar levels and to prevent overeating at meals later in the day. Planning ahead is also a key component.  Cooking - Educational Psychologist conducted group or individual video education with verbal and written material and guidebook.  Patient learns eating strategies to improve overall health, including an approach to cook more at home. Recommendations include thinking of animal protein as a side on your plate rather than center stage and focusing instead on lower calorie dense options like vegetables, fruits, whole grains, and plant-based proteins, such as beans. Making sauces in large quantities to freeze for later and leaving the skin on your vegetables are also  recommended to maximize your experience.  Cooking - Healthy Salads and Dressing Clinical staff conducted group or individual video education with verbal and written material and guidebook.  Patient learns that vegetables, fruits, whole grains, and legumes are the foundations of the Pritikin Eating Plan. Recommendations include how to incorporate each of these in flavorful and healthy salads, and how to create homemade salad dressings. Proper handling of ingredients is also covered.  Cooking - Soups and [employer] - Soups and Desserts Clinical staff conducted group or individual video education with verbal and written material and guidebook.  Patient learns that Pritikin soups and desserts make for easy, nutritious, and delicious snacks and meal components that are low in sodium, fat, sugar, and calorie density, while high in vitamins, minerals, and filling fiber. Recommendations include simple and healthy ideas for soups and desserts.   Overview     The Pritikin Solution Program Overview Clinical staff conducted group or individual video education with verbal and written material and guidebook.  Patient learns that the results of the Pritikin Program have been documented in more than 100 articles published in peer-reviewed journals, and the benefits include reducing risk factors for (and, in some cases, even reversing) high cholesterol, high blood pressure, type 2 diabetes, obesity, and more! An overview of the three key pillars of the Pritikin Program will be covered: eating well, doing regular exercise, and having a healthy mind-set.  WORKSHOPS  Exercise: Exercise Basics: Building Your Action Plan Clinical staff led group instruction and group discussion with PowerPoint presentation and patient guidebook. To enhance the learning environment the use of posters, models and videos may be added. At the conclusion of this workshop, patients will comprehend the difference between physical activity and exercise, as well as the benefits of incorporating both, into their routine. Patients will understand the FITT (Frequency, Intensity, Time, and Type) principle and how to use it to build an exercise action plan. In addition, safety concerns and other considerations for exercise and cardiac rehab will be addressed by the presenter. The purpose of this lesson is to promote a comprehensive and effective weekly exercise routine in order to improve patients overall level of  fitness.   Managing Heart Disease: Your Path to a Healthier Heart Clinical staff led group instruction and group discussion with PowerPoint presentation and patient guidebook. To enhance the learning environment the use of posters, models and videos may be added.At the conclusion of this workshop, patients will understand the anatomy and physiology of the heart. Additionally, they will understand how Pritikins three pillars impact the risk factors, the progression, and the management of heart disease.  The purpose of this lesson is to provide a high-level overview of the heart, heart disease, and how the Pritikin lifestyle positively impacts risk factors.  Exercise Biomechanics Clinical staff led group instruction and group discussion with PowerPoint presentation and patient guidebook. To enhance the learning environment the use of posters, models and videos may be added. Patients will learn how the structural parts of their bodies function and how these functions impact their daily activities, movement, and exercise. Patients will learn how to promote a neutral spine, learn how to manage pain, and identify ways to improve their physical movement in order to promote healthy living. The purpose of this lesson is to expose patients to common physical limitations that impact physical activity. Participants will learn practical ways to adapt and manage aches and pains, and to minimize their effect on regular exercise. Patients will learn how to maintain  good posture while sitting, walking, and lifting.  Balance Training and Fall Prevention  Clinical staff led group instruction and group discussion with PowerPoint presentation and patient guidebook. To enhance the learning environment the use of posters, models and videos may be added. At the conclusion of this workshop, patients will understand the importance of their sensorimotor skills (vision, proprioception, and the vestibular system)  in maintaining their ability to balance as they age. Patients will apply a variety of balancing exercises that are appropriate for their current level of function. Patients will understand the common causes for poor balance, possible solutions to these problems, and ways to modify their physical environment in order to minimize their fall risk. The purpose of this lesson is to teach patients about the importance of maintaining balance as they age and ways to minimize their risk of falling.  WORKSHOPS   Nutrition:  Fueling a Ship Broker led group instruction and group discussion with PowerPoint presentation and patient guidebook. To enhance the learning environment the use of posters, models and videos may be added. Patients will review the foundational principles of the Pritikin Eating Plan and understand what constitutes a serving size in each of the food groups. Patients will also learn Pritikin-friendly foods that are better choices when away from home and review make-ahead meal and snack options. Calorie density will be reviewed and applied to three nutrition priorities: weight maintenance, weight loss, and weight gain. The purpose of this lesson is to reinforce (in a group setting) the key concepts around what patients are recommended to eat and how to apply these guidelines when away from home by planning and selecting Pritikin-friendly options. Patients will understand how calorie density may be adjusted for different weight management goals.  Mindful Eating  Clinical staff led group instruction and group discussion with PowerPoint presentation and patient guidebook. To enhance the learning environment the use of posters, models and videos may be added. Patients will briefly review the concepts of the Pritikin Eating Plan and the importance of low-calorie dense foods. The concept of mindful eating will be introduced as well as the importance of paying attention to internal hunger  signals. Triggers for non-hunger eating and techniques for dealing with triggers will be explored. The purpose of this lesson is to provide patients with the opportunity to review the basic principles of the Pritikin Eating Plan, discuss the value of eating mindfully and how to measure internal cues of hunger and fullness using the Hunger Scale. Patients will also discuss reasons for non-hunger eating and learn strategies to use for controlling emotional eating.  Targeting Your Nutrition Priorities Clinical staff led group instruction and group discussion with PowerPoint presentation and patient guidebook. To enhance the learning environment the use of posters, models and videos may be added. Patients will learn how to determine their genetic susceptibility to disease by reviewing their family history. Patients will gain insight into the importance of diet as part of an overall healthy lifestyle in mitigating the impact of genetics and other environmental insults. The purpose of this lesson is to provide patients with the opportunity to assess their personal nutrition priorities by looking at their family history, their own health history and current risk factors. Patients will also be able to discuss ways of prioritizing and modifying the Pritikin Eating Plan for their highest risk areas  Menu  Clinical staff led group instruction and group discussion with PowerPoint presentation and patient guidebook. To enhance the learning environment the use of posters, models and  videos may be added. Using menus brought in from [employer], or printed from [employer], patients will apply the Pritikin dining out guidelines that were presented in the [employer] video. Patients will also be able to practice these guidelines in a variety of provided scenarios. The purpose of this lesson is to provide patients with the opportunity to practice hands-on learning of the Pritikin Dining Out guidelines  with actual menus and practice scenarios.  Label Reading Clinical staff led group instruction and group discussion with PowerPoint presentation and patient guidebook. To enhance the learning environment the use of posters, models and videos may be added. Patients will review and discuss the Pritikin label reading guidelines presented in Pritikins Label Reading Educational series video. Using fool labels brought in from local grocery stores and markets, patients will apply the label reading guidelines and determine if the packaged food meet the Pritikin guidelines. The purpose of this lesson is to provide patients with the opportunity to review, discuss, and practice hands-on learning of the Pritikin Label Reading guidelines with actual packaged food labels. Cooking School  Pritikins [employer] are designed to teach patients ways to prepare quick, simple, and affordable recipes at home. The importance of nutritions role in chronic disease risk reduction is reflected in its emphasis in the overall Pritikin program. By learning how to prepare essential core Pritikin Eating Plan recipes, patients will increase control over what they eat; be able to customize the flavor of foods without the use of added salt, sugar, or fat; and improve the quality of the food they consume. By learning a set of core recipes which are easily assembled, quickly prepared, and affordable, patients are more likely to prepare more healthy foods at home. These workshops focus on convenient breakfasts, simple entres, side dishes, and desserts which can be prepared with minimal effort and are consistent with nutrition recommendations for cardiovascular risk reduction. Cooking [employer] are taught by a armed forces logistics/support/administrative officer (RD) who has been trained by the [employer]. The chef or RD has a clear understanding of the importance of minimizing - if not completely eliminating - added fat, sugar, and  sodium in recipes. Throughout the series of Cooking School Workshop sessions, patients will learn about healthy ingredients and efficient methods of cooking to build confidence in their capability to prepare    Cooking School weekly topics:  Adding Flavor- Sodium-Free  Fast and Healthy Breakfasts  Powerhouse Plant-Based Proteins  Satisfying Salads and Dressings  Simple Sides and Sauces  International Cuisine-Spotlight on the [employer] Zones  Delicious Desserts  Savory Soups  [employer] - Meals in a Astronomer Appetizers and Snacks  Comforting Weekend Breakfasts  One-Pot Wonders   Fast Evening Meals  Landscape Architect Your Pritikin Plate  WORKSHOPS   Healthy Mindset (Psychosocial):  Focused Goals, Sustainable Changes Clinical staff led group instruction and group discussion with PowerPoint presentation and patient guidebook. To enhance the learning environment the use of posters, models and videos may be added. Patients will be able to apply effective goal setting strategies to establish at least one personal goal, and then take consistent, meaningful action toward that goal. They will learn to identify common barriers to achieving personal goals and develop strategies to overcome them. Patients will also gain an understanding of how our mind-set can impact our ability to achieve goals and the importance of cultivating a positive and growth-oriented mind-set. The purpose of this lesson is to provide patients  with a deeper understanding of how to set and achieve personal goals, as well as the tools and strategies needed to overcome common obstacles which may arise along the way.  From Head to Heart: The Power of a Healthy Outlook  Clinical staff led group instruction and group discussion with PowerPoint presentation and patient guidebook. To enhance the learning environment the use of posters, models and videos may be added. Patients will be able to recognize and  describe the impact of emotions and mood on physical health. They will discover the importance of self-care and explore self-care practices which may work for them. Patients will also learn how to utilize the 4 Cs to cultivate a healthier outlook and better manage stress and challenges. The purpose of this lesson is to demonstrate to patients how a healthy outlook is an essential part of maintaining good health, especially as they continue their cardiac rehab journey.  Healthy Sleep for a Healthy Heart Clinical staff led group instruction and group discussion with PowerPoint presentation and patient guidebook. To enhance the learning environment the use of posters, models and videos may be added. At the conclusion of this workshop, patients will be able to demonstrate knowledge of the importance of sleep to overall health, well-being, and quality of life. They will understand the symptoms of, and treatments for, common sleep disorders. Patients will also be able to identify daytime and nighttime behaviors which impact sleep, and they will be able to apply these tools to help manage sleep-related challenges. The purpose of this lesson is to provide patients with a general overview of sleep and outline the importance of quality sleep. Patients will learn about a few of the most common sleep disorders. Patients will also be introduced to the concept of sleep hygiene, and discover ways to self-manage certain sleeping problems through simple daily behavior changes. Finally, the workshop will motivate patients by clarifying the links between quality sleep and their goals of heart-healthy living.   Recognizing and Reducing Stress Clinical staff led group instruction and group discussion with PowerPoint presentation and patient guidebook. To enhance the learning environment the use of posters, models and videos may be added. At the conclusion of this workshop, patients will be able to understand the types of stress  reactions, differentiate between acute and chronic stress, and recognize the impact that chronic stress has on their health. They will also be able to apply different coping mechanisms, such as reframing negative self-talk. Patients will have the opportunity to practice a variety of stress management techniques, such as deep abdominal breathing, progressive muscle relaxation, and/or guided imagery.  The purpose of this lesson is to educate patients on the role of stress in their lives and to provide healthy techniques for coping with it.  Learning Barriers/Preferences:  Learning Barriers/Preferences - 08/30/24 1408       Learning Barriers/Preferences   Learning Barriers Sight    Learning Preferences Skilled Demonstration;Individual Instruction;Group Instruction          Education Topics:  Knowledge Questionnaire Score:  Knowledge Questionnaire Score - 08/30/24 1424       Knowledge Questionnaire Score   Pre Score 25/28          Core Components/Risk Factors/Patient Goals at Admission:  Personal Goals and Risk Factors at Admission - 08/30/24 1409       Core Components/Risk Factors/Patient Goals on Admission    Weight Management Yes;Weight Loss    Intervention Weight Management: Develop a combined nutrition and exercise program designed to reach desired  caloric intake, while maintaining appropriate intake of nutrient and fiber, sodium and fats, and appropriate energy expenditure required for the weight goal.;Weight Management: Provide education and appropriate resources to help participant work on and attain dietary goals.;Weight Management/Obesity: Establish reasonable short term and long term weight goals.    Admit Weight 208 lb 8 oz (94.6 kg)    Goal Weight: Long Term 190 lb (86.2 kg)    Expected Outcomes Short Term: Continue to assess and modify interventions until short term weight is achieved;Long Term: Adherence to nutrition and physical activity/exercise program aimed toward  attainment of established weight goal;Weight Loss: Understanding of general recommendations for a balanced deficit meal plan, which promotes 1-2 lb weight loss per week and includes a negative energy balance of [phone] kcal/d;Understanding recommendations for meals to include 15-35% energy as protein, 25-35% energy from fat, 35-60% energy from carbohydrates, less than 200mg  of dietary cholesterol, 20-35 gm of total fiber daily;Understanding of distribution of calorie intake throughout the day with the consumption of 4-5 meals/snacks    Tobacco Cessation Yes    Number of packs per day .5    Intervention Assist the participant in steps to quit. Provide individualized education and counseling about committing to Tobacco Cessation, relapse prevention, and pharmacological support that can be provided by physician.;Education officer, environmental, assist with locating and accessing local/national Quit Smoking programs, and support quit date choice.    Expected Outcomes Short Term: Will demonstrate readiness to quit, by selecting a quit date.;Short Term: Will quit all tobacco product use, adhering to prevention of relapse plan.;Long Term: Complete abstinence from all tobacco products for at least 12 months from quit date.    Hypertension Yes    Intervention Provide education on lifestyle modifcations including regular physical activity/exercise, weight management, moderate sodium restriction and increased consumption of fresh fruit, vegetables, and low fat dairy, alcohol moderation, and smoking cessation.;Monitor prescription use compliance.    Expected Outcomes Short Term: Continued assessment and intervention until BP is < 140/82mm HG in hypertensive participants. < 130/62mm HG in hypertensive participants with diabetes, heart failure or chronic kidney disease.;Long Term: Maintenance of blood pressure at goal levels.    Lipids Yes    Intervention Provide education and support for participant on nutrition &  aerobic/resistive exercise along with prescribed medications to achieve LDL 70mg , HDL >40mg .    Expected Outcomes Short Term: Participant states understanding of desired cholesterol values and is compliant with medications prescribed. Participant is following exercise prescription and nutrition guidelines.;Long Term: Cholesterol controlled with medications as prescribed, with individualized exercise RX and with personalized nutrition plan. Value goals: LDL < 70mg , HDL > 40 mg.    Stress Yes    Intervention Offer individual and/or small group education and counseling on adjustment to heart disease, stress management and health-related lifestyle change. Teach and support self-help strategies.;Refer participants experiencing significant psychosocial distress to appropriate mental health specialists for further evaluation and treatment. When possible, include family members and significant others in education/counseling sessions.    Expected Outcomes Short Term: Participant demonstrates changes in health-related behavior, relaxation and other stress management skills, ability to obtain effective social support, and compliance with psychotropic medications if prescribed.;Long Term: Emotional wellbeing is indicated by absence of clinically significant psychosocial distress or social isolation.          Core Components/Risk Factors/Patient Goals Review:    Core Components/Risk Factors/Patient Goals at Discharge (Final Review):    ITP Comments:  ITP Comments     Row Name 08/30/24 1057  ITP Comments Levi Bihari, MD: Medical Director. Intorduction to the [facility] / Intensive Cardiac Rehab. Initial orientation packet reviewed with the patient          Comments:Levi Johnston attended orientation for the cardiac rehabilitation program on  08/30/2024  to perform initial intake and exercise walk test. Patient introduced to the [facility] education and orientation packet was  reviewed. Completed 6-minute walk test, measurements, initial ITP, and exercise prescription. Vital signs systolic  BP's were in the 140's to 150's. please see previous documentation as onsite provider was notified of initial BP elevation. Patient will restart his antihypertensive medication at 1/2 dose. Telemetry-normal sinus rhythm, asymptomatic.Hadassah Elpidio Quan RN BSN   Service time was from 1000 to 1310.         [1]  Current Outpatient Medications:    aspirin  EC 81 MG tablet, Take 1 tablet (81 mg total) by mouth daily. Swallow whole., Disp: 90 tablet, Rfl: 3   nitroGLYCERIN  (NITROSTAT ) 0.4 MG SL tablet, Place 1 tablet (0.4 mg total) under the tongue every 5 (five) minutes x 3 doses as needed for chest pain. Do NOT take if you have taken Viagra  within the last 24 hours., Disp: 25 tablet, Rfl: 2   prasugrel  (EFFIENT ) 10 MG TABS tablet, Take 1 tablet (10 mg total) by mouth daily., Disp: 30 tablet, Rfl: 11   rosuvastatin  (CRESTOR ) 40 MG tablet, Take 1 tablet (40 mg total) by mouth daily., Disp: 90 tablet, Rfl: 3   doxepin  (SINEQUAN ) 10 MG capsule, Take 1 capsule (10 mg total) by mouth at bedtime. (Patient not taking: Reported on 08/30/2024), Disp: 30 capsule, Rfl: 3   nicotine  (NICODERM CQ  - DOSED IN MG/24 HOURS) 14 mg/24hr patch, Place 1 patch (14 mg total) onto the skin daily. (Patient not taking: Reported on 08/30/2024), Disp: 28 patch, Rfl: 1   nicotine  polacrilex (COMMIT) 4 MG lozenge, Take 1 lozenge (4 mg total) by mouth as needed for smoking cessation. (Patient not taking: Reported on 08/30/2024), Disp: 100 tablet, Rfl: 3   olmesartan -hydrochlorothiazide (BENICAR  HCT) 20-12.5 MG tablet, Take 0.5 tablets by mouth daily., Disp: , Rfl:    sildenafil  (VIAGRA ) 50 MG tablet, Take 0.5-1 tablets (25-50 mg total) by mouth daily as needed for erectile dysfunction. (Patient not taking: Reported on 08/30/2024), Disp: 10 tablet, Rfl: 1 [2]  Social History Tobacco Use  Smoking Status Every Day    Current packs/day: 0.50   Average packs/day: 1.2 packs/day for 50.1 years (60.1 ttl pk-yrs)   Types: Cigarettes   Start date: 07/10/2024  Smokeless Tobacco Never  Tobacco Comments   Patient given phone number to the [place] quitline.

## 2024-08-31 ENCOUNTER — Telehealth (HOSPITAL_COMMUNITY): Payer: Self-pay | Admitting: *Deleted

## 2024-08-31 NOTE — Telephone Encounter (Signed)
-----   Message from Callie E Goodrich sent at 08/30/2024  4:20 PM EST ----- Thank you. I would also recommend we repeat a BMET in 1 week because Olmesartan -HCTZ was previously stopped due to soft BP and AKI. Anitra, can you please help with this and notify patient?  Thank you! Callie ----- Message ----- From: Cyrus Hadassah ORN, RN Sent: 08/30/2024   3:03 PM EST To: Orren LOISE Fabry, PA-C; Toribio MARLA Slain, MD; Cal#  Good afternoon Callie and Dr Slain,  Mr [facility] attended cardiac rehab orientation this morning. I consulted our onsite provider Orren Fabry [facility] who restarted his olmesartan  hydrochlorothiazide at 1/2 the dose daily and told the patient to keep a BP log to turn in 2 weeks. Please review attached media and Tessa's note. Mr Pangborn will follow up with Callie in February. I want to bring this to your attention! Sincerely, Hadassah Cyrus RN Cardiac Rehab

## 2024-09-05 ENCOUNTER — Telehealth: Payer: Self-pay | Admitting: Cardiovascular Disease

## 2024-09-05 ENCOUNTER — Encounter (HOSPITAL_COMMUNITY): Admission: RE | Admit: 2024-09-05 | Discharge: 2024-09-05 | Attending: Cardiovascular Disease

## 2024-09-05 DIAGNOSIS — I214 Non-ST elevation (NSTEMI) myocardial infarction: Secondary | ICD-10-CM

## 2024-09-05 DIAGNOSIS — Z955 Presence of coronary angioplasty implant and graft: Secondary | ICD-10-CM

## 2024-09-05 NOTE — Telephone Encounter (Signed)
 Patient viewed mychart message  ?

## 2024-09-05 NOTE — Progress Notes (Signed)
 Called pt, vm regarding lab work needed in one wk due to stopping medicaiton

## 2024-09-05 NOTE — Telephone Encounter (Signed)
 Left message to call office. Will send message to patient through my chart.

## 2024-09-05 NOTE — Telephone Encounter (Signed)
 Pt returning call. Did not see any messages left in patient chart, did not know who caller was. Please advise.

## 2024-09-05 NOTE — Progress Notes (Signed)
 Daily Session Note  Patient Details  Name: Levi Johnston MRN: [id number] Date of Birth: [birth year] Referring Provider:   Flowsheet Row CARDIAC REHAB PHASE II ORIENTATION from 08/30/2024 in [facility]  Referring Provider Lonni Cash, MD    Encounter Date: 09/05/2024  Check In:  Session Check In - 09/05/24 1252       Check-In   Supervising physician immediately available to respond to emergencies [facility] MD immediately available    Physician(s) Barnie Hila, NP    Location [facility]    Staff Present Hadassah Quan, RN, Mallory Parkins, MS, ACSM-CEP, CCRP, Exercise Physiologist;Jetta Vannie HECKLE, ACSM-CEP, Exercise Physiologist;Gunda Maqueda Lennon, RN, Avonne Gal, MS, ACSM-CEP, Exercise Physiologist    Virtual Visit No    Medication changes reported     No    Fall or balance concerns reported    No    Tobacco Cessation No Change    Warm-up and Cool-down Performed as group-led instruction    Resistance Training Performed Yes    VAD Patient? No    PAD/SET Patient? No      Pain Assessment   Currently in Pain? No/denies    Pain Score 0-No pain    Multiple Pain Sites No          Capillary Blood Glucose: No results found for this or any previous visit (from the past 24 hours).    Tobacco Use History[1]  Goals Met:  Exercise tolerated well No report of concerns or symptoms today Strength training completed today  Goals Unmet:  Not Applicable  Comments: Pt started cardiac rehab today.  Pt tolerated light exercise without difficulty. VSS, telemetry-NSR, asymptomatic.  Medication list reconciled. Pt denies barriers to medication compliance.  PSYCHOSOCIAL ASSESSMENT:  PHQ-5. Pt exhibits positive coping skills, hopeful outlook with supportive family. No psychosocial needs identified at this time, no psychosocial interventions necessary.   Pt oriented to exercise equipment and routine.     Understanding verbalized.     Dr. Wilbert Bihari is Medical Director for Cardiac Rehab at [facility].    [1]  Social History Tobacco Use  Smoking Status Every Day   Current packs/day: 0.50   Average packs/day: 1.2 packs/day for 50.2 years (60.1 ttl pk-yrs)   Types: Cigarettes   Start date: 07/10/2024  Smokeless Tobacco Never  Tobacco Comments   Patient given phone number to the [place] quitline.

## 2024-09-07 ENCOUNTER — Encounter (HOSPITAL_COMMUNITY)
Admission: RE | Admit: 2024-09-07 | Discharge: 2024-09-07 | Disposition: A | Source: Ambulatory Visit | Attending: Cardiovascular Disease

## 2024-09-07 DIAGNOSIS — I214 Non-ST elevation (NSTEMI) myocardial infarction: Secondary | ICD-10-CM | POA: Diagnosis not present

## 2024-09-07 DIAGNOSIS — Z955 Presence of coronary angioplasty implant and graft: Secondary | ICD-10-CM

## 2024-09-12 ENCOUNTER — Encounter (HOSPITAL_COMMUNITY)
Admission: RE | Admit: 2024-09-12 | Discharge: 2024-09-12 | Disposition: A | Discharge status: Home / Self Care | Source: Ambulatory Visit | Attending: Cardiovascular Disease | Admitting: Cardiovascular Disease

## 2024-09-12 DIAGNOSIS — I214 Non-ST elevation (NSTEMI) myocardial infarction: Secondary | ICD-10-CM | POA: Diagnosis not present

## 2024-09-12 DIAGNOSIS — Z955 Presence of coronary angioplasty implant and graft: Secondary | ICD-10-CM

## 2024-09-13 NOTE — Progress Notes (Signed)
 Cardiac Individual Treatment Plan  Patient Details  Name: Levi Johnston MRN: [id number] Date of Birth: 08-12-1966 Referring Provider:   Flowsheet Row CARDIAC REHAB PHASE II ORIENTATION from 08/30/2024 in [facility]  Referring Provider Lonni Cash, MD    Initial Encounter Date:  Flowsheet Row CARDIAC REHAB PHASE II ORIENTATION from 08/30/2024 in [facility]  Date 08/30/24    Visit Diagnosis: 07/08/24 NSTEMI (non-ST elevated myocardial infarction) (HCC)  07/10/24 Status post coronary artery stent placement LAD x 2  Patient's Home Medications on Admission: Current Medications[1]  Past Medical History: Past Medical History:  Diagnosis Date   Anxiety    CAD (coronary artery disease)    Depression    Hyperlipidemia    Hypertension     Tobacco Use: Tobacco Use History[2]  Labs: Review Flowsheet  More data may exist      Latest Ref Rng & Units 09/26/2021 04/04/2024 07/08/2024 07/09/2024 07/15/2024  Labs for ITP Cardiac and Pulmonary Rehab  Cholestrol 0 - 200 mg/dL 778  - 757  - -  LDL (calc) 0 - 99 mg/dL 856  - 833  - -  HDL-C >40 mg/dL 37  - 41  - -  Trlycerides <150 mg/dL 774  - 824  - -  Hemoglobin A1c 4.8 - 5.6 % 6.2  6.3  - 6.1  -  TCO2 22 - 32 mmol/L - - - - 23     Capillary Blood Glucose: No results found for: GLUCAP   Exercise Target Goals: Exercise Program Goal: Individual exercise prescription set using results from initial 6 min walk test and THRR while considering  patients activity barriers and safety.   Exercise Prescription Goal: Initial exercise prescription builds to 30-45 minutes a day of aerobic activity, 2-3 days per week.  Home exercise guidelines will be given to patient during program as part of exercise prescription that the participant will acknowledge.  Activity Barriers & Risk Stratification:  Activity Barriers  & Cardiac Risk Stratification - 08/30/24 1405       Activity Barriers & Cardiac Risk Stratification   Activity Barriers Back Problems;Neck/Spine Problems;Muscular Weakness;Joint Problems;Shortness of Breath    Cardiac Risk Stratification High          6 Minute Walk:  6 Minute Walk     Row Name 08/30/24 1255         6 Minute Walk   Phase Initial     Distance 1560 feet     Walk Time 6 minutes     # of Rest Breaks 0     MPH 3     METS 4.3     RPE 9     Perceived Dyspnea  0     VO2 Peak 15.1     Symptoms No     Resting HR 95 bpm     Resting BP 152/80     Resting Oxygen Saturation  97 %     Exercise Oxygen Saturation  during 6 min walk 99 %     Max Ex. HR 110 bpm     Max Ex. BP 146/80     2 Minute Post BP 150/86        Oxygen Initial Assessment:   Oxygen Re-Evaluation:   Oxygen Discharge (Final Oxygen Re-Evaluation):   Initial Exercise Prescription:  Initial Exercise Prescription - 08/30/24 1100       Date of Initial Exercise RX  and Referring Provider   Date 08/30/24    Referring Provider Lonni Cash, MD    Expected Discharge Date 10/31/24      Recumbant Bike   Level 2    RPM 60    Watts 69    Minutes 15    METs 4.3      Recumbant Elliptical   Level 2    RPM 60    Watts 90    Minutes 15    METs 4.3      Prescription Details   Frequency (times per week) 2    Duration Progress to 30 minutes of continuous aerobic without signs/symptoms of physical distress      Intensity   THRR 40-80% of Max Heartrate 57-131    Ratings of Perceived Exertion 11-13    Perceived Dyspnea 0-4      Progression   Progression Continue progressive overload as per policy without signs/symptoms or physical distress.      Resistance Training   Training Prescription Yes    Reps 10-15          Perform Capillary Blood Glucose checks as needed.  Exercise Prescription Changes:   Exercise Prescription Changes     Row Name 09/05/24 1600              Response to Exercise   Blood Pressure (Admit) 132/90       Blood Pressure (Exercise) 130/80       Blood Pressure (Exit) 122/86       Heart Rate (Admit) 95 bpm       Heart Rate (Exercise) 125 bpm       Heart Rate (Exit) 109 bpm       Rating of Perceived Exertion (Exercise) 9       Symptoms None       Comments Pt's first day in the CRP2 program       Duration Continue with 30 min of aerobic exercise without signs/symptoms of physical distress.       Intensity THRR unchanged         Progression   Progression Continue to progress workloads to maintain intensity without signs/symptoms of physical distress.       Average METs 2.75         Resistance Training   Weight 4lbs       Reps 10-15       Time 5 Minutes         Interval Training   Interval Training No         Recumbant Bike   Level 2       RPM 79       Watts 16       Minutes 15       METs 2.1         Recumbant Elliptical   Level 2       RPM 64       Watts 89       Minutes 15       METs 3.4          Exercise Comments:   Exercise Comments     Row Name 09/05/24 1653           Exercise Comments Pt exercised on his first day in the CRP2 program without complaints. Tolerated initial session well.          Exercise Goals and Review:   Exercise Goals     Row Name 08/30/24 1058  Exercise Goals   Increase Physical Activity Yes       Intervention Provide advice, education, support and counseling about physical activity/exercise needs.;Develop an individualized exercise prescription for aerobic and resistive training based on initial evaluation findings, risk stratification, comorbidities and participant's personal goals.       Expected Outcomes Short Term: Attend rehab on a regular basis to increase amount of physical activity.;Long Term: Exercising regularly at least 3-5 days a week.;Long Term: Add in home exercise to make exercise part of routine and to increase amount of physical activity.        Increase Strength and Stamina Yes       Intervention Provide advice, education, support and counseling about physical activity/exercise needs.;Develop an individualized exercise prescription for aerobic and resistive training based on initial evaluation findings, risk stratification, comorbidities and participant's personal goals.       Expected Outcomes Short Term: Increase workloads from initial exercise prescription for resistance, speed, and METs.;Short Term: Perform resistance training exercises routinely during rehab and add in resistance training at home;Long Term: Improve cardiorespiratory fitness, muscular endurance and strength as measured by increased METs and functional capacity ( )       Able to understand and use rate of perceived exertion (RPE) scale Yes       Intervention Provide education and explanation on how to use RPE scale       Expected Outcomes Short Term: Able to use RPE daily in rehab to express subjective intensity level;Long Term:  Able to use RPE to guide intensity level when exercising independently       Knowledge and understanding of Target Heart Rate Range (THRR) Yes       Intervention Provide education and explanation of THRR including how the numbers were predicted and where they are located for reference       Expected Outcomes Short Term: Able to state/look up THRR;Long Term: Able to use THRR to govern intensity when exercising independently;Short Term: Able to use daily as guideline for intensity in rehab       Understanding of Exercise Prescription Yes       Intervention Provide education, explanation, and written materials on patient's individual exercise prescription       Expected Outcomes Short Term: Able to explain program exercise prescription;Long Term: Able to explain home exercise prescription to exercise independently          Exercise Goals Re-Evaluation :  Exercise Goals Re-Evaluation     Row Name 09/05/24 1651             Exercise Goal  Re-Evaluation   Exercise Goals Review Increase Physical Activity;Understanding of Exercise Prescription;Increase Strength and Stamina;Knowledge and understanding of Target Heart Rate Range (THRR);Able to understand and use rate of perceived exertion (RPE) scale       Comments Pt's first day in the CRP2 program. Pt understands the exercise Rx, THRR and RPE scale.       Expected Outcomes Will continue to monitor patient and progress exercise workloads as tolerated.          Discharge Exercise Prescription (Final Exercise Prescription Changes):  Exercise Prescription Changes - 09/05/24 1600       Response to Exercise   Blood Pressure (Admit) 132/90    Blood Pressure (Exercise) 130/80    Blood Pressure (Exit) 122/86    Heart Rate (Admit) 95 bpm    Heart Rate (Exercise) 125 bpm    Heart Rate (Exit) 109 bpm    Rating of Perceived Exertion (  Exercise) 9    Symptoms None    Comments Pt's first day in the CRP2 program    Duration Continue with 30 min of aerobic exercise without signs/symptoms of physical distress.    Intensity THRR unchanged      Progression   Progression Continue to progress workloads to maintain intensity without signs/symptoms of physical distress.    Average METs 2.75      Resistance Training   Weight 4lbs    Reps 10-15    Time 5 Minutes      Interval Training   Interval Training No      Recumbant Bike   Level 2    RPM 79    Watts 16    Minutes 15    METs 2.1      Recumbant Elliptical   Level 2    RPM 64    Watts 89    Minutes 15    METs 3.4          Nutrition:  Target Goals: Understanding of nutrition guidelines, daily intake of sodium 1500mg , cholesterol 200mg , calories 30% from fat and 7% or less from saturated fats, daily to have 5 or more servings of fruits and vegetables.  Biometrics:  Pre Biometrics - 08/30/24 1130       Pre Biometrics   Waist Circumference 43 inches    Hip Circumference 41 inches    Waist to Hip Ratio 1.05 %     Triceps Skinfold 5 mm    % Body Fat 24.1 %    Grip Strength 40 kg    Flexibility 15.5 in    Single Leg Stand 30 seconds           Nutrition Therapy Plan and Nutrition Goals:  Nutrition Therapy & Goals - 09/05/24 1408       Nutrition Therapy   Diet Low Sodium, Heart Healthy    Drug/Food Interactions --      Personal Nutrition Goals   Nutrition Goal Patient to improve diet quality by using the plate method as a guide for meal planning to include lean protein/plant protein, fruits, vegetables, whole grains, nonfat dairy as part of a well-balanced diet.    Personal Goal #2 Patient to limit sodium intake to <2300 mg per day.    Personal Goal #3 Patient to identify strategies for weight loss with goal of 0.5-2 # per week of weight loss.    Comments Patient with history of CAD with recent NSTEMI in 06/2024 s/p DES to LAD and DES to 2nd Diag, hypertension, hyperlipidemia, prediabetes, and tobacco abuse. Current BMI in overweight category; pt reports increased weight in abdominal area. Nutrition pertinent labs include A1c 6.1%, Total Cholesterol 242, triglycerides 175, LDL 166 (06/2024). Pt reports making significant dietary changes since discharge from hospital such as decreasing intake of fast food and increasing intake of lean animal protein and fruit/vegetables. Previously consumed large portions of sugary foods such as pies, cakes; now consumes couple of [employer] when desires dessert. Pt reports limited cooking experience and inquiring about meal delivery services. RD provided suggestions for convenience foods such as no salt added/low sodium canned vegetables/beans, frozen brown rice. RD discussed higher sodium content of pre-made meals; will look into lower sodium options. Patient will benefit from participation in cardiac rehab for nutrition education, exercise, and lifestyle modification.      Intervention Plan   Intervention Prescribe, educate and counsel regarding individualized  specific dietary modifications aiming towards targeted core components such as weight, hypertension,  lipid management, diabetes, heart failure and other comorbidities.    Expected Outcomes Short Term Goal: Understand basic principles of dietary content, such as calories, fat, sodium, cholesterol and nutrients.;Long Term Goal: Adherence to prescribed nutrition plan.          Nutrition Assessments:  MEDIFICTS Score Key: >=70 Need to make dietary changes  40-70 Heart Healthy Diet <= 40 Therapeutic Level Cholesterol Diet    Picture Your Plate Scores: <59 Unhealthy dietary pattern with much room for improvement. 41-50 Dietary pattern unlikely to meet recommendations for good health and room for improvement. 51-60 More healthful dietary pattern, with some room for improvement.  >60 Healthy dietary pattern, although there may be some specific behaviors that could be improved.    Nutrition Goals Re-Evaluation:   Nutrition Goals Re-Evaluation:   Nutrition Goals Discharge (Final Nutrition Goals Re-Evaluation):   Psychosocial: Target Goals: Acknowledge presence or absence of significant depression and/or stress, maximize coping skills, provide positive support system. Participant is able to verbalize types and ability to use techniques and skills needed for reducing stress and depression.  Initial Review & Psychosocial Screening:  Initial Psych Review & Screening - 08/30/24 1304       Initial Review   Current issues with Current Stress Concerns;Current Sleep Concerns;History of Depression;Current Depression;Current Anxiety/Panic    Source of Stress Concerns Chronic Illness;Family    Comments Esley says that he has raised his daughter as a single parent since she was an infant. Jamaury is also taking care of her elderly father who lives nearby. Ariq says he has experienced some anxiety and depression  from his recent MI/ Stenting. Jeremiah says he has been anxious about exercising.  Douglas says that he is worriend that he might have another heart attack. Charlena reports that he has difficulty sleeping and has only been sleeping 3 hours a night. Charlena has not started taking doxepin  because he was worried about being dependant on taking the medication. Charlena says he is not interested in counseling at this time. Charlena says he sometimes feels anxious.      Family Dynamics   Good Support System? Yes   Charlena has his daughter and his father for support.     Barriers   Psychosocial barriers to participate in program The patient should benefit from training in stress management and relaxation.      Screening Interventions   Interventions Encouraged to exercise;To provide support and resources with identified psychosocial needs    Expected Outcomes Long Term Goal: Stressors or current issues are controlled or eliminated.;Short Term goal: Identification and review with participant of any Quality of Life or Depression concerns found by scoring the questionnaire.          Quality of Life Scores:  Quality of Life - 08/30/24 1408       Quality of Life   Select Quality of Life      Quality of Life Scores   Health/Function Pre 23 %    Socioeconomic Pre 26.5 %    Psych/Spiritual Pre 23.64 %    Family Pre 25.13 %    GLOBAL Pre 24.06 %         Scores of 19 and below usually indicate a poorer quality of life in these areas.  A difference of  2-3 points is a clinically meaningful difference.  A difference of 2-3 points in the total score of the Quality of Life Index has been associated with significant improvement in overall quality of life, self-image, physical symptoms, and  general health in studies assessing change in quality of life.  PHQ-9: Review Flowsheet  More data exists      08/30/2024 08/17/2024 06/06/2024 03/30/2024 10/03/2021  Depression screen PHQ 2/9  Decreased Interest 1 0 2 1 0  Down, Depressed, Hopeless 1 1 2 1  0  PHQ - 2 Score 2 1 4 2  0  Altered sleeping 1 3 3 3 2    Tired, decreased energy 1 0 2 2 0  Change in appetite 1 2 1 2 1   Feeling bad or failure about yourself  0 0 0 0 0  Trouble concentrating 0 0 1 1 0  Moving slowly or fidgety/restless 0 0 0 1 0  Suicidal thoughts 0 0 0 0 0  PHQ-9 Score 5 6 11  11  3    Difficult doing work/chores Somewhat difficult Somewhat difficult Somewhat difficult Somewhat difficult -    Details       Data saved with a previous flowsheet row definition        Interpretation of Total Score  Total Score Depression Severity:  1-4 = Minimal depression, 5-9 = Mild depression, 10-14 = Moderate depression, 15-19 = Moderately severe depression, 20-27 = Severe depression   Psychosocial Evaluation and Intervention:   Psychosocial Re-Evaluation:  Psychosocial Re-Evaluation     Row Name 09/13/24 0935             Psychosocial Re-Evaluation   Current issues with Current Depression;History of Depression;Current Sleep Concerns;Current Stress Concerns;Current Anxiety/Panic       Comments Charlena has not voiced any additional psychosocial concerns or stressors during exercise at cardiac rehab       Expected Outcomes Charlena will experience controlled or decreased psychosocial concerns or stressors by completion of cardiac rehab       Interventions Stress management education;Encouraged to attend Cardiac Rehabilitation for the exercise;Relaxation education       Continue Psychosocial Services  Follow up required by staff          Psychosocial Discharge (Final Psychosocial Re-Evaluation):  Psychosocial Re-Evaluation - 09/13/24 0935       Psychosocial Re-Evaluation   Current issues with Current Depression;History of Depression;Current Sleep Concerns;Current Stress Concerns;Current Anxiety/Panic    Comments Charlena has not voiced any additional psychosocial concerns or stressors during exercise at cardiac rehab    Expected Outcomes Charlena will experience controlled or decreased psychosocial concerns or stressors by completion of  cardiac rehab    Interventions Stress management education;Encouraged to attend Cardiac Rehabilitation for the exercise;Relaxation education    Continue Psychosocial Services  Follow up required by staff          Vocational Rehabilitation: Provide vocational rehab assistance to qualifying candidates.   Vocational Rehab Evaluation & Intervention:  Vocational Rehab - 08/30/24 1314       Initial Vocational Rehab Evaluation & Intervention   Assessment shows need for Vocational Rehabilitation No   Saahir is semi-retired and does not need vocational rehab at this time.         Education: Education Goals: Education classes will be provided on a weekly basis, covering required topics. Participant will state understanding/return demonstration of topics presented.     Core Videos: Exercise    Move It!  Clinical staff conducted group or individual video education with verbal and written material and guidebook.  Patient learns the recommended Pritikin exercise program. Exercise with the goal of living a long, healthy life. Some of the health benefits of exercise include controlled diabetes, healthier blood pressure levels, improved  cholesterol levels, improved heart and lung capacity, improved sleep, and better body composition. Everyone should speak with their doctor before starting or changing an exercise routine.  Biomechanical Limitations Clinical staff conducted group or individual video education with verbal and written material and guidebook.  Patient learns how biomechanical limitations can impact exercise and how we can mitigate and possibly overcome limitations to have an impactful and balanced exercise routine.  Body Composition Clinical staff conducted group or individual video education with verbal and written material and guidebook.  Patient learns that body composition (ratio of muscle mass to fat mass) is a key component to assessing overall fitness, rather than body weight  alone. Increased fat mass, especially visceral belly fat, can put us  at increased risk for metabolic syndrome, type 2 diabetes, heart disease, and even death. It is recommended to combine diet and exercise (cardiovascular and resistance training) to improve your body composition. Seek guidance from your physician and exercise physiologist before implementing an exercise routine.  Exercise Action Plan Clinical staff conducted group or individual video education with verbal and written material and guidebook.  Patient learns the recommended strategies to achieve and enjoy long-term exercise adherence, including variety, self-motivation, self-efficacy, and positive decision making. Benefits of exercise include fitness, good health, weight management, more energy, better sleep, less stress, and overall well-being.  Medical   Heart Disease Risk Reduction Clinical staff conducted group or individual video education with verbal and written material and guidebook.  Patient learns our heart is our most vital organ as it circulates oxygen, nutrients, white blood cells, and hormones throughout the entire body, and carries waste away. Data supports a plant-based eating plan like the Pritikin Program for its effectiveness in slowing progression of and reversing heart disease. The video provides a number of recommendations to address heart disease.   Metabolic Syndrome and Belly Fat  Clinical staff conducted group or individual video education with verbal and written material and guidebook.  Patient learns what metabolic syndrome is, how it leads to heart disease, and how one can reverse it and keep it from coming back. You have metabolic syndrome if you have 3 of the following 5 criteria: abdominal obesity, high blood pressure, high triglycerides, low HDL cholesterol, and high blood sugar.  Hypertension and Heart Disease Clinical staff conducted group or individual video education with verbal and written material  and guidebook.  Patient learns that high blood pressure, or hypertension, is very common in the [place] . Hypertension is largely due to excessive salt intake, but other important risk factors include being overweight, physical inactivity, drinking too much alcohol, smoking, and not eating enough potassium from fruits and vegetables. High blood pressure is a leading risk factor for heart attack, stroke, congestive heart failure, dementia, kidney failure, and premature death. Long-term effects of excessive salt intake include stiffening of the arteries and thickening of heart muscle and organ damage. Recommendations include ways to reduce hypertension and the risk of heart disease.  Diseases of Our Time - Focusing on Diabetes Clinical staff conducted group or individual video education with verbal and written material and guidebook.  Patient learns why the best way to stop diseases of our time is prevention, through food and other lifestyle changes. Medicine (such as prescription pills and surgeries) is often only a Band-Aid on the problem, not a long-term solution. Most common diseases of our time include obesity, type 2 diabetes, hypertension, heart disease, and cancer. The Pritikin Program is recommended and has been proven to help reduce, reverse, and/or  prevent the damaging effects of metabolic syndrome.  Nutrition   Overview of the Pritikin Eating Plan  Clinical staff conducted group or individual video education with verbal and written material and guidebook.  Patient learns about the Pritikin Eating Plan for disease risk reduction. The Pritikin Eating Plan emphasizes a wide variety of unrefined, minimally-processed carbohydrates, like fruits, vegetables, whole grains, and legumes. Go, Caution, and Stop food choices are explained. Plant-based and lean animal proteins are emphasized. Rationale provided for low sodium intake for blood pressure control, low added sugars for blood sugar  stabilization, and low added fats and oils for coronary artery disease risk reduction and weight management.  Calorie Density  Clinical staff conducted group or individual video education with verbal and written material and guidebook.  Patient learns about calorie density and how it impacts the Pritikin Eating Plan. Knowing the characteristics of the food you choose will help you decide whether those foods will lead to weight gain or weight loss, and whether you want to consume more or less of them. Weight loss is usually a side effect of the Pritikin Eating Plan because of its focus on low calorie-dense foods.  Label Reading  Clinical staff conducted group or individual video education with verbal and written material and guidebook.  Patient learns about the Pritikin recommended label reading guidelines and corresponding recommendations regarding calorie density, added sugars, sodium content, and whole grains.  Dining Out - Part 1  Clinical staff conducted group or individual video education with verbal and written material and guidebook.  Patient learns that restaurant meals can be sabotaging because they can be so high in calories, fat, sodium, and/or sugar. Patient learns recommended strategies on how to positively address this and avoid unhealthy pitfalls.  Facts on Fats  Clinical staff conducted group or individual video education with verbal and written material and guidebook.  Patient learns that lifestyle modifications can be just as effective, if not more so, as many medications for lowering your risk of heart disease. A Pritikin lifestyle can help to reduce your risk of inflammation and atherosclerosis (cholesterol build-up, or plaque, in the artery walls). Lifestyle interventions such as dietary choices and physical activity address the cause of atherosclerosis. A review of the types of fats and their impact on blood cholesterol levels, along with dietary recommendations to reduce fat  intake is also included.  Nutrition Action Plan  Clinical staff conducted group or individual video education with verbal and written material and guidebook.  Patient learns how to incorporate Pritikin recommendations into their lifestyle. Recommendations include planning and keeping personal health goals in mind as an important part of their success.  Healthy Mind-Set    Healthy Minds, Bodies, Hearts  Clinical staff conducted group or individual video education with verbal and written material and guidebook.  Patient learns how to identify when they are stressed. Video will discuss the impact of that stress, as well as the many benefits of stress management. Patient will also be introduced to stress management techniques. The way we think, act, and feel has an impact on our hearts.  How Our Thoughts Can Heal Our Hearts  Clinical staff conducted group or individual video education with verbal and written material and guidebook.  Patient learns that negative thoughts can cause depression and anxiety. This can result in negative lifestyle behavior and serious health problems. Cognitive behavioral therapy is an effective method to help control our thoughts in order to change and improve our emotional outlook.  Additional Videos:  Exercise  Improving Performance  Clinical staff conducted group or individual video education with verbal and written material and guidebook.  Patient learns to use a non-linear approach by alternating intensity levels and lengths of time spent exercising to help burn more calories and lose more body fat. Cardiovascular exercise helps improve heart health, metabolism, hormonal balance, blood sugar control, and recovery from fatigue. Resistance training improves strength, endurance, balance, coordination, reaction time, metabolism, and muscle mass. Flexibility exercise improves circulation, posture, and balance. Seek guidance from your physician and exercise physiologist  before implementing an exercise routine and learn your capabilities and proper form for all exercise.  Introduction to Yoga  Clinical staff conducted group or individual video education with verbal and written material and guidebook.  Patient learns about yoga, a discipline of the coming together of mind, breath, and body. The benefits of yoga include improved flexibility, improved range of motion, better posture and core strength, increased lung function, weight loss, and positive self-image. Yogas heart health benefits include lowered blood pressure, healthier heart rate, decreased cholesterol and triglyceride levels, improved immune function, and reduced stress. Seek guidance from your physician and exercise physiologist before implementing an exercise routine and learn your capabilities and proper form for all exercise.  Medical   Aging: Enhancing Your Quality of Life  Clinical staff conducted group or individual video education with verbal and written material and guidebook.  Patient learns key strategies and recommendations to stay in good physical health and enhance quality of life, such as prevention strategies, having an advocate, securing a Health Care Proxy and Power of Attorney, and keeping a list of medications and system for tracking them. It also discusses how to avoid risk for bone loss.  Biology of Weight Control  Clinical staff conducted group or individual video education with verbal and written material and guidebook.  Patient learns that weight gain occurs because we consume more calories than we burn (eating more, moving less). Even if your body weight is normal, you may have higher ratios of fat compared to muscle mass. Too much body fat puts you at increased risk for cardiovascular disease, heart attack, stroke, type 2 diabetes, and obesity-related cancers. In addition to exercise, following the Pritikin Eating Plan can help reduce your risk.  Decoding Lab Results  Clinical  staff conducted group or individual video education with verbal and written material and guidebook.  Patient learns that lab test reflects one measurement whose values change over time and are influenced by many factors, including medication, stress, sleep, exercise, food, hydration, pre-existing medical conditions, and more. It is recommended to use the knowledge from this video to become more involved with your lab results and evaluate your numbers to speak with your doctor.   Diseases of Our Time - Overview  Clinical staff conducted group or individual video education with verbal and written material and guidebook.  Patient learns that according to the CDC, 50% to 70% of chronic diseases (such as obesity, type 2 diabetes, elevated lipids, hypertension, and heart disease) are avoidable through lifestyle improvements including healthier food choices, listening to satiety cues, and increased physical activity.  Sleep Disorders Clinical staff conducted group or individual video education with verbal and written material and guidebook.  Patient learns how good quality and duration of sleep are important to overall health and well-being. Patient also learns about sleep disorders and how they impact health along with recommendations to address them, including discussing with a physician.  Nutrition  Dining Out - Part 2 Clinical staff conducted  group or individual video education with verbal and written material and guidebook.  Patient learns how to plan ahead and communicate in order to maximize their dining experience in a healthy and nutritious manner. Included are recommended food choices based on the type of restaurant the patient is visiting.   Fueling a Banker conducted group or individual video education with verbal and written material and guidebook.  There is a strong connection between our food choices and our health. Diseases like obesity and type 2 diabetes are very  prevalent and are in large-part due to lifestyle choices. The Pritikin Eating Plan provides plenty of food and hunger-curbing satisfaction. It is easy to follow, affordable, and helps reduce health risks.  Menu Workshop  Clinical staff conducted group or individual video education with verbal and written material and guidebook.  Patient learns that restaurant meals can sabotage health goals because they are often packed with calories, fat, sodium, and sugar. Recommendations include strategies to plan ahead and to communicate with the manager, chef, or server to help order a healthier meal.  Planning Your Eating Strategy  Clinical staff conducted group or individual video education with verbal and written material and guidebook.  Patient learns about the Pritikin Eating Plan and its benefit of reducing the risk of disease. The Pritikin Eating Plan does not focus on calories. Instead, it emphasizes high-quality, nutrient-rich foods. By knowing the characteristics of the foods, we choose, we can determine their calorie density and make informed decisions.  Targeting Your Nutrition Priorities  Clinical staff conducted group or individual video education with verbal and written material and guidebook.  Patient learns that lifestyle habits have a tremendous impact on disease risk and progression. This video provides eating and physical activity recommendations based on your personal health goals, such as reducing LDL cholesterol, losing weight, preventing or controlling type 2 diabetes, and reducing high blood pressure.  Vitamins and Minerals  Clinical staff conducted group or individual video education with verbal and written material and guidebook.  Patient learns different ways to obtain key vitamins and minerals, including through a recommended healthy diet. It is important to discuss all supplements you take with your doctor.   Healthy Mind-Set    Smoking Cessation  Clinical staff conducted group  or individual video education with verbal and written material and guidebook.  Patient learns that cigarette smoking and tobacco addiction pose a serious health risk which affects millions of people. Stopping smoking will significantly reduce the risk of heart disease, lung disease, and many forms of cancer. Recommended strategies for quitting are covered, including working with your doctor to develop a successful plan.  Culinary   Becoming a Set Designer conducted group or individual video education with verbal and written material and guidebook.  Patient learns that cooking at home can be healthy, cost-effective, quick, and puts them in control. Keys to cooking healthy recipes will include looking at your recipe, assessing your equipment needs, planning ahead, making it simple, choosing cost-effective seasonal ingredients, and limiting the use of added fats, salts, and sugars.  Cooking - Breakfast and Snacks  Clinical staff conducted group or individual video education with verbal and written material and guidebook.  Patient learns how important breakfast is to satiety and nutrition through the entire day. Recommendations include key foods to eat during breakfast to help stabilize blood sugar levels and to prevent overeating at meals later in the day. Planning ahead is also a key component.  Cooking - Futures Trader  and Sides  Clinical staff conducted group or individual video education with verbal and written material and guidebook.  Patient learns eating strategies to improve overall health, including an approach to cook more at home. Recommendations include thinking of animal protein as a side on your plate rather than center stage and focusing instead on lower calorie dense options like vegetables, fruits, whole grains, and plant-based proteins, such as beans. Making sauces in large quantities to freeze for later and leaving the skin on your vegetables are also recommended to maximize  your experience.  Cooking - Healthy Salads and Dressing Clinical staff conducted group or individual video education with verbal and written material and guidebook.  Patient learns that vegetables, fruits, whole grains, and legumes are the foundations of the Pritikin Eating Plan. Recommendations include how to incorporate each of these in flavorful and healthy salads, and how to create homemade salad dressings. Proper handling of ingredients is also covered. Cooking - Soups and [employer] - Soups and Desserts Clinical staff conducted group or individual video education with verbal and written material and guidebook.  Patient learns that Pritikin soups and desserts make for easy, nutritious, and delicious snacks and meal components that are low in sodium, fat, sugar, and calorie density, while high in vitamins, minerals, and filling fiber. Recommendations include simple and healthy ideas for soups and desserts.   Overview     The Pritikin Solution Program Overview Clinical staff conducted group or individual video education with verbal and written material and guidebook.  Patient learns that the results of the Pritikin Program have been documented in more than 100 articles published in peer-reviewed journals, and the benefits include reducing risk factors for (and, in some cases, even reversing) high cholesterol, high blood pressure, type 2 diabetes, obesity, and more! An overview of the three key pillars of the Pritikin Program will be covered: eating well, doing regular exercise, and having a healthy mind-set.  WORKSHOPS  Exercise: Exercise Basics: Building Your Action Plan Clinical staff led group instruction and group discussion with PowerPoint presentation and patient guidebook. To enhance the learning environment the use of posters, models and videos may be added. At the conclusion of this workshop, patients will comprehend the difference between physical activity and exercise, as well  as the benefits of incorporating both, into their routine. Patients will understand the FITT (Frequency, Intensity, Time, and Type) principle and how to use it to build an exercise action plan. In addition, safety concerns and other considerations for exercise and cardiac rehab will be addressed by the presenter. The purpose of this lesson is to promote a comprehensive and effective weekly exercise routine in order to improve patients overall level of fitness.   Managing Heart Disease: Your Path to a Healthier Heart Clinical staff led group instruction and group discussion with PowerPoint presentation and patient guidebook. To enhance the learning environment the use of posters, models and videos may be added.At the conclusion of this workshop, patients will understand the anatomy and physiology of the heart. Additionally, they will understand how Pritikins three pillars impact the risk factors, the progression, and the management of heart disease.  The purpose of this lesson is to provide a high-level overview of the heart, heart disease, and how the Pritikin lifestyle positively impacts risk factors.  Exercise Biomechanics Clinical staff led group instruction and group discussion with PowerPoint presentation and patient guidebook. To enhance the learning environment the use of posters, models and videos may be added. Patients will learn how the  structural parts of their bodies function and how these functions impact their daily activities, movement, and exercise. Patients will learn how to promote a neutral spine, learn how to manage pain, and identify ways to improve their physical movement in order to promote healthy living. The purpose of this lesson is to expose patients to common physical limitations that impact physical activity. Participants will learn practical ways to adapt and manage aches and pains, and to minimize their effect on regular exercise. Patients will learn how to  maintain good posture while sitting, walking, and lifting.  Balance Training and Fall Prevention  Clinical staff led group instruction and group discussion with PowerPoint presentation and patient guidebook. To enhance the learning environment the use of posters, models and videos may be added. At the conclusion of this workshop, patients will understand the importance of their sensorimotor skills (vision, proprioception, and the vestibular system) in maintaining their ability to balance as they age. Patients will apply a variety of balancing exercises that are appropriate for their current level of function. Patients will understand the common causes for poor balance, possible solutions to these problems, and ways to modify their physical environment in order to minimize their fall risk. The purpose of this lesson is to teach patients about the importance of maintaining balance as they age and ways to minimize their risk of falling.  WORKSHOPS   Nutrition:  Fueling a Ship Broker led group instruction and group discussion with PowerPoint presentation and patient guidebook. To enhance the learning environment the use of posters, models and videos may be added. Patients will review the foundational principles of the Pritikin Eating Plan and understand what constitutes a serving size in each of the food groups. Patients will also learn Pritikin-friendly foods that are better choices when away from home and review make-ahead meal and snack options. Calorie density will be reviewed and applied to three nutrition priorities: weight maintenance, weight loss, and weight gain. The purpose of this lesson is to reinforce (in a group setting) the key concepts around what patients are recommended to eat and how to apply these guidelines when away from home by planning and selecting Pritikin-friendly options. Patients will understand how calorie density may be adjusted for different weight management  goals.  Mindful Eating  Clinical staff led group instruction and group discussion with PowerPoint presentation and patient guidebook. To enhance the learning environment the use of posters, models and videos may be added. Patients will briefly review the concepts of the Pritikin Eating Plan and the importance of low-calorie dense foods. The concept of mindful eating will be introduced as well as the importance of paying attention to internal hunger signals. Triggers for non-hunger eating and techniques for dealing with triggers will be explored. The purpose of this lesson is to provide patients with the opportunity to review the basic principles of the Pritikin Eating Plan, discuss the value of eating mindfully and how to measure internal cues of hunger and fullness using the Hunger Scale. Patients will also discuss reasons for non-hunger eating and learn strategies to use for controlling emotional eating.  Targeting Your Nutrition Priorities Clinical staff led group instruction and group discussion with PowerPoint presentation and patient guidebook. To enhance the learning environment the use of posters, models and videos may be added. Patients will learn how to determine their genetic susceptibility to disease by reviewing their family history. Patients will gain insight into the importance of diet as part of an overall healthy lifestyle in mitigating the  impact of genetics and other environmental insults. The purpose of this lesson is to provide patients with the opportunity to assess their personal nutrition priorities by looking at their family history, their own health history and current risk factors. Patients will also be able to discuss ways of prioritizing and modifying the Pritikin Eating Plan for their highest risk areas  Menu  Clinical staff led group instruction and group discussion with PowerPoint presentation and patient guidebook. To enhance the learning environment the use of posters,  models and videos may be added. Using menus brought in from [employer], or printed from [employer], patients will apply the Pritikin dining out guidelines that were presented in the [employer] video. Patients will also be able to practice these guidelines in a variety of provided scenarios. The purpose of this lesson is to provide patients with the opportunity to practice hands-on learning of the Pritikin Dining Out guidelines with actual menus and practice scenarios.  Label Reading Clinical staff led group instruction and group discussion with PowerPoint presentation and patient guidebook. To enhance the learning environment the use of posters, models and videos may be added. Patients will review and discuss the Pritikin label reading guidelines presented in Pritikins Label Reading Educational series video. Using fool labels brought in from local grocery stores and markets, patients will apply the label reading guidelines and determine if the packaged food meet the Pritikin guidelines. The purpose of this lesson is to provide patients with the opportunity to review, discuss, and practice hands-on learning of the Pritikin Label Reading guidelines with actual packaged food labels. Cooking School  Pritikins [employer] are designed to teach patients ways to prepare quick, simple, and affordable recipes at home. The importance of nutritions role in chronic disease risk reduction is reflected in its emphasis in the overall Pritikin program. By learning how to prepare essential core Pritikin Eating Plan recipes, patients will increase control over what they eat; be able to customize the flavor of foods without the use of added salt, sugar, or fat; and improve the quality of the food they consume. By learning a set of core recipes which are easily assembled, quickly prepared, and affordable, patients are more likely to prepare more healthy foods at home. These workshops  focus on convenient breakfasts, simple entres, side dishes, and desserts which can be prepared with minimal effort and are consistent with nutrition recommendations for cardiovascular risk reduction. Cooking [employer] are taught by a armed forces logistics/support/administrative officer (RD) who has been trained by the [employer]. The chef or RD has a clear understanding of the importance of minimizing - if not completely eliminating - added fat, sugar, and sodium in recipes. Throughout the series of Cooking School Workshop sessions, patients will learn about healthy ingredients and efficient methods of cooking to build confidence in their capability to prepare    Cooking School weekly topics:  Adding Flavor- Sodium-Free  Fast and Healthy Breakfasts  Powerhouse Plant-Based Proteins  Satisfying Salads and Dressings  Simple Sides and Sauces  International Cuisine-Spotlight on the [employer] Zones  Delicious Desserts  Savory Soups  [employer] - Meals in a Astronomer Appetizers and Snacks  Comforting Weekend Breakfasts  One-Pot Wonders   Fast Evening Meals  Landscape Architect Your Pritikin Plate  WORKSHOPS   Healthy Mindset (Psychosocial):  Focused Goals, Sustainable Changes Clinical staff led group instruction and group discussion with PowerPoint presentation and patient guidebook. To enhance the learning environment the  use of posters, models and videos may be added. Patients will be able to apply effective goal setting strategies to establish at least one personal goal, and then take consistent, meaningful action toward that goal. They will learn to identify common barriers to achieving personal goals and develop strategies to overcome them. Patients will also gain an understanding of how our mind-set can impact our ability to achieve goals and the importance of cultivating a positive and growth-oriented mind-set. The purpose of this lesson is to provide patients with a deeper  understanding of how to set and achieve personal goals, as well as the tools and strategies needed to overcome common obstacles which may arise along the way.  From Head to Heart: The Power of a Healthy Outlook  Clinical staff led group instruction and group discussion with PowerPoint presentation and patient guidebook. To enhance the learning environment the use of posters, models and videos may be added. Patients will be able to recognize and describe the impact of emotions and mood on physical health. They will discover the importance of self-care and explore self-care practices which may work for them. Patients will also learn how to utilize the 4 Cs to cultivate a healthier outlook and better manage stress and challenges. The purpose of this lesson is to demonstrate to patients how a healthy outlook is an essential part of maintaining good health, especially as they continue their cardiac rehab journey.  Healthy Sleep for a Healthy Heart Clinical staff led group instruction and group discussion with PowerPoint presentation and patient guidebook. To enhance the learning environment the use of posters, models and videos may be added. At the conclusion of this workshop, patients will be able to demonstrate knowledge of the importance of sleep to overall health, well-being, and quality of life. They will understand the symptoms of, and treatments for, common sleep disorders. Patients will also be able to identify daytime and nighttime behaviors which impact sleep, and they will be able to apply these tools to help manage sleep-related challenges. The purpose of this lesson is to provide patients with a general overview of sleep and outline the importance of quality sleep. Patients will learn about a few of the most common sleep disorders. Patients will also be introduced to the concept of sleep hygiene, and discover ways to self-manage certain sleeping problems through simple daily behavior changes.  Finally, the workshop will motivate patients by clarifying the links between quality sleep and their goals of heart-healthy living.   Recognizing and Reducing Stress Clinical staff led group instruction and group discussion with PowerPoint presentation and patient guidebook. To enhance the learning environment the use of posters, models and videos may be added. At the conclusion of this workshop, patients will be able to understand the types of stress reactions, differentiate between acute and chronic stress, and recognize the impact that chronic stress has on their health. They will also be able to apply different coping mechanisms, such as reframing negative self-talk. Patients will have the opportunity to practice a variety of stress management techniques, such as deep abdominal breathing, progressive muscle relaxation, and/or guided imagery.  The purpose of this lesson is to educate patients on the role of stress in their lives and to provide healthy techniques for coping with it.  Learning Barriers/Preferences:  Learning Barriers/Preferences - 08/30/24 1408       Learning Barriers/Preferences   Learning Barriers Sight    Learning Preferences Skilled Demonstration;Individual Instruction;Group Instruction          Education  Topics:  Knowledge Questionnaire Score:  Knowledge Questionnaire Score - 08/30/24 1424       Knowledge Questionnaire Score   Pre Score 25/28          Core Components/Risk Factors/Patient Goals at Admission:  Personal Goals and Risk Factors at Admission - 08/30/24 1409       Core Components/Risk Factors/Patient Goals on Admission    Weight Management Yes;Weight Loss    Intervention Weight Management: Develop a combined nutrition and exercise program designed to reach desired caloric intake, while maintaining appropriate intake of nutrient and fiber, sodium and fats, and appropriate energy expenditure required for the weight goal.;Weight Management: Provide  education and appropriate resources to help participant work on and attain dietary goals.;Weight Management/Obesity: Establish reasonable short term and long term weight goals.    Admit Weight 208 lb 8 oz (94.6 kg)    Goal Weight: Long Term 190 lb (86.2 kg)    Expected Outcomes Short Term: Continue to assess and modify interventions until short term weight is achieved;Long Term: Adherence to nutrition and physical activity/exercise program aimed toward attainment of established weight goal;Weight Loss: Understanding of general recommendations for a balanced deficit meal plan, which promotes 1-2 lb weight loss per week and includes a negative energy balance of [phone] kcal/d;Understanding recommendations for meals to include 15-35% energy as protein, 25-35% energy from fat, 35-60% energy from carbohydrates, less than 200mg  of dietary cholesterol, 20-35 gm of total fiber daily;Understanding of distribution of calorie intake throughout the day with the consumption of 4-5 meals/snacks    Tobacco Cessation Yes    Number of packs per day .5    Intervention Assist the participant in steps to quit. Provide individualized education and counseling about committing to Tobacco Cessation, relapse prevention, and pharmacological support that can be provided by physician.;Education officer, environmental, assist with locating and accessing local/national Quit Smoking programs, and support quit date choice.    Expected Outcomes Short Term: Will demonstrate readiness to quit, by selecting a quit date.;Short Term: Will quit all tobacco product use, adhering to prevention of relapse plan.;Long Term: Complete abstinence from all tobacco products for at least 12 months from quit date.    Hypertension Yes    Intervention Provide education on lifestyle modifcations including regular physical activity/exercise, weight management, moderate sodium restriction and increased consumption of fresh fruit, vegetables, and low fat dairy,  alcohol moderation, and smoking cessation.;Monitor prescription use compliance.    Expected Outcomes Short Term: Continued assessment and intervention until BP is < 140/73mm HG in hypertensive participants. < 130/75mm HG in hypertensive participants with diabetes, heart failure or chronic kidney disease.;Long Term: Maintenance of blood pressure at goal levels.    Lipids Yes    Intervention Provide education and support for participant on nutrition & aerobic/resistive exercise along with prescribed medications to achieve LDL 70mg , HDL >40mg .    Expected Outcomes Short Term: Participant states understanding of desired cholesterol values and is compliant with medications prescribed. Participant is following exercise prescription and nutrition guidelines.;Long Term: Cholesterol controlled with medications as prescribed, with individualized exercise RX and with personalized nutrition plan. Value goals: LDL < 70mg , HDL > 40 mg.    Stress Yes    Intervention Offer individual and/or small group education and counseling on adjustment to heart disease, stress management and health-related lifestyle change. Teach and support self-help strategies.;Refer participants experiencing significant psychosocial distress to appropriate mental health specialists for further evaluation and treatment. When possible, include family members and significant others in education/counseling sessions.  Expected Outcomes Short Term: Participant demonstrates changes in health-related behavior, relaxation and other stress management skills, ability to obtain effective social support, and compliance with psychotropic medications if prescribed.;Long Term: Emotional wellbeing is indicated by absence of clinically significant psychosocial distress or social isolation.          Core Components/Risk Factors/Patient Goals Review:   Goals and Risk Factor Review     Row Name 09/13/24 [phone]             Core Components/Risk  Factors/Patient Goals Review   Personal Goals Review Lipids;Stress;Hypertension;Weight Management/Obesity;Tobacco Cessation       Review Charlena is doing well with exercise at cardiac rehab since his 09/05/24 start. Vital signs have been stable, orientation MET levels have maintained       Expected Outcomes Tom will continue to particpate in cardiac rehab for exercise nutrition and lifestyle modifications          Core Components/Risk Factors/Patient Goals at Discharge (Final Review):   Goals and Risk Factor Review - 09/13/24 0938       Core Components/Risk Factors/Patient Goals Review   Personal Goals Review Lipids;Stress;Hypertension;Weight Management/Obesity;Tobacco Cessation    Review Charlena is doing well with exercise at cardiac rehab since his 09/05/24 start. Vital signs have been stable, orientation MET levels have maintained    Expected Outcomes Tom will continue to particpate in cardiac rehab for exercise nutrition and lifestyle modifications          ITP Comments:  ITP Comments     Row Name 08/30/24 1057 09/05/24 1419 09/13/24 0935       ITP Comments Wilbert Bihari, MD: Medical Director. Intorduction to the [facility] / Intensive Cardiac Rehab. Initial orientation packet reviewed with the patient 30 Day ITP review. Tom began cardiac rehab today (09/05/24) and tolerated exercise well. 30 Day ITP review. Charlena has attended three cardiac rehab sessions since beginning on 09/05/24 and is off to a good start with exercise        Comments: see ITP comments    [1]  Current Outpatient Medications:    aspirin  EC 81 MG tablet, Take 1 tablet (81 mg total) by mouth daily. Swallow whole., Disp: 90 tablet, Rfl: 3   doxepin  (SINEQUAN ) 10 MG capsule, Take 1 capsule (10 mg total) by mouth at bedtime. (Patient not taking: Reported on 08/30/2024), Disp: 30 capsule, Rfl: 3   nicotine  (NICODERM CQ  - DOSED IN MG/24 HOURS) 14 mg/24hr patch, Place 1 patch (14 mg total) onto the skin  daily. (Patient not taking: Reported on 08/30/2024), Disp: 28 patch, Rfl: 1   nicotine  polacrilex (COMMIT) 4 MG lozenge, Take 1 lozenge (4 mg total) by mouth as needed for smoking cessation. (Patient not taking: Reported on 08/30/2024), Disp: 100 tablet, Rfl: 3   nitroGLYCERIN  (NITROSTAT ) 0.4 MG SL tablet, Place 1 tablet (0.4 mg total) under the tongue every 5 (five) minutes x 3 doses as needed for chest pain. Do NOT take if you have taken Viagra  within the last 24 hours., Disp: 25 tablet, Rfl: 2   olmesartan -hydrochlorothiazide (BENICAR  HCT) 20-12.5 MG tablet, Take 0.5 tablets by mouth daily., Disp: , Rfl:    prasugrel  (EFFIENT ) 10 MG TABS tablet, Take 1 tablet (10 mg total) by mouth daily., Disp: 30 tablet, Rfl: 11   rosuvastatin  (CRESTOR ) 40 MG tablet, Take 1 tablet (40 mg total) by mouth daily., Disp: 90 tablet, Rfl: 3   sildenafil  (VIAGRA ) 50 MG tablet, Take 0.5-1 tablets (25-50 mg total) by mouth daily as  needed for erectile dysfunction. (Patient not taking: Reported on 08/30/2024), Disp: 10 tablet, Rfl: 1 [2]  Social History Tobacco Use  Smoking Status Every Day   Current packs/day: 0.50   Average packs/day: 1.2 packs/day for 50.2 years (60.1 ttl pk-yrs)   Types: Cigarettes   Start date: 07/10/2024  Smokeless Tobacco Never  Tobacco Comments   Patient given phone number to the [place] quitline.

## 2024-09-14 ENCOUNTER — Encounter (HOSPITAL_COMMUNITY)

## 2024-09-19 ENCOUNTER — Ambulatory Visit: Admitting: Family Medicine

## 2024-09-19 ENCOUNTER — Encounter (HOSPITAL_COMMUNITY)

## 2024-09-21 ENCOUNTER — Telehealth (HOSPITAL_COMMUNITY): Payer: Self-pay

## 2024-09-21 ENCOUNTER — Encounter (HOSPITAL_COMMUNITY)
Admission: RE | Admit: 2024-09-21 | Discharge: 2024-09-21 | Disposition: A | Discharge status: Home / Self Care | Source: Ambulatory Visit | Attending: Cardiovascular Disease | Admitting: Cardiovascular Disease

## 2024-09-21 DIAGNOSIS — Z955 Presence of coronary angioplasty implant and graft: Secondary | ICD-10-CM | POA: Insufficient documentation

## 2024-09-21 DIAGNOSIS — I214 Non-ST elevation (NSTEMI) myocardial infarction: Secondary | ICD-10-CM | POA: Insufficient documentation

## 2024-09-21 NOTE — Telephone Encounter (Signed)
 Patient returned call regarding absences from cardiac rehab, states he has been sick but is starting to feel better. Expects to be in class on Monday.

## 2024-09-21 NOTE — Telephone Encounter (Signed)
 Attempted to call patient regarding 3x no call, no shows in a row- no answer, left message.

## 2024-09-26 ENCOUNTER — Encounter (HOSPITAL_COMMUNITY)

## 2024-09-28 ENCOUNTER — Encounter (HOSPITAL_COMMUNITY)

## 2024-09-28 ENCOUNTER — Telehealth (HOSPITAL_COMMUNITY): Payer: Self-pay

## 2024-09-28 NOTE — Telephone Encounter (Signed)
 Welfare check regarding pt's two no calls/no shows.  Msg left with a request for pt to return our phone call.

## 2024-10-01 ENCOUNTER — Other Ambulatory Visit (HOSPITAL_COMMUNITY): Payer: Self-pay

## 2024-10-03 ENCOUNTER — Telehealth (HOSPITAL_COMMUNITY): Payer: Self-pay

## 2024-10-03 ENCOUNTER — Encounter (HOSPITAL_COMMUNITY): Admission: RE | Admit: 2024-10-03

## 2024-10-03 NOTE — Telephone Encounter (Signed)
 Welfare check regarding pt's four no calls/no shows. Msg left with a request for pt to return our phone call.

## 2024-10-05 ENCOUNTER — Encounter (HOSPITAL_COMMUNITY)

## 2024-10-05 ENCOUNTER — Telehealth (HOSPITAL_COMMUNITY): Payer: Self-pay

## 2024-10-05 NOTE — Addendum Note (Signed)
 Encounter addended by: Orval Alan HERO, RD on: 10/05/2024 4:04 PM  Actions taken: Flowsheet accepted

## 2024-10-05 NOTE — Telephone Encounter (Addendum)
 Called pt regarding his lack of attendance since 09/14/24.  Requested him to call us  back and let us  know if he intends to return to cardiac rehab ([facility]) or if we should discharge him from the program.  Explained that if we do not hear back from him, we may drop him from the program, at which point he would need to get another [facility] referral from his PCP in order to restart cardiac rehab.  Callback phone number given.

## 2024-10-07 ENCOUNTER — Encounter (HOSPITAL_COMMUNITY): Payer: Self-pay

## 2024-10-07 NOTE — Progress Notes (Addendum)
 Pt discharged from cardiac rehab due to lack of attendance (see documentation from previous telephone encounters).  CR appointments cancelled.

## 2024-10-10 ENCOUNTER — Encounter (HOSPITAL_COMMUNITY)

## 2024-10-12 ENCOUNTER — Encounter (HOSPITAL_COMMUNITY)

## 2024-10-13 NOTE — Progress Notes (Unsigned)
 "  Cardiology Office Note:    Date:  10/13/2024   ID:  Levi Johnston, DOB [birth year], MRN [id number]  PCP:  Chandra Toribio POUR, MD  Cardiologist:  Lonni Cash, MD { Click to update primary MD,subspecialty MD or APP then REFRESH:1}    Referring MD: Chandra Toribio POUR, MD   Chief Complaint: follow-up of CAD  History of Present Illness:    Levi Johnston is a 59 y.o. male with a history of CAD with recent NSTEMI in 06/2024 s/p DES to LAD and DES to 2nd Diag, hypertension, hyperlipidemia, prediabetes, and tobacco abuse who is followed by Dr. Cash and presents today for follow-up of CAD.  Patient was first seen by Cardiology during admission in 06/2024 after presenting with chest pain with associated shortness of breath and sweating. Echo showed LVEF of 60-65% with hypokinesis of the mid and distal anterior septum and mid inferoseptal segment, normal RV function, and mild chordal SAM with only trivial MR. LHC showed 95% stenosis of mid LAD and 70% stenosis of ostial D2 as well as mild disease of OM2 and RCA. He underwent complex PCI with DES to LAD and DES to D2. He was started on DAPT with Aspirin  and Brilitna as well as a high-intensity statin. He was readmitted a few days later for recurrent chest pain and dyspnea. He also reported feeling anxious since discharge which was likely contributing to symptoms. EKG showed no acute ischemic changes. High-sensitivity troponin was significantly lower than recent admission. Brilinta  was stopped and he was loaded with Effient . He was admitted overnight for observation and was feeling much better the next morning. BP was soft on admission and he was noted to be in AKI with creatinine peaking around 2.5 (baseline around 1.2). Therefore, Olmesartan -HCTZ was stopped and he was treated with IV fluids with some improvement. Renal function subsequently normalized.   He was last seen by me in 07/2024 at which time he was doing well with no recurrent chest pain or  dyspnea since switching to Effient . Home sleep study was ordered to assess for obstructive sleep apnea but has not been done yet.   Patient presents today for follow-up. ***  CAD  History of NSTEMI Patient was recently admitted for a NSTEMI in 06/2024 and underwent DES to LAD and DES to 2nd Diag.  - No chest pain. *** - Continue DAPT with Aspirin  81mg  daily and Effient  10mg  daily. .  - Continue Crestor  40mg  daily.    Hypertension BP well controlled. *** - Continue Olmesartan -HCTZ 10-6.25mg  daily.  - Will repeat BMET to make sure renal function stable after Olmesartan -HCTZ was restarted at lower dose. ***   Hyperlipidemia Lipid panel in 06/2024 during admission for NSTEMI: Total Cholesterol 242, Triglycerides 175, HDL 41, LDL 166. Lipoprotein (a) 32.7. LDL goal <70 given CAD.  -  Continue Crestor  40mg  daily (this was started during admission for NSTEMI). - Will repeat lipid panel and LFTs. ***   Prediabetes Hemoglobin A1c 6.1% in 06/2024.  - Continue lifestyle modifications.    Tobacco Abuse Patient is trying to quit. He was previously smoking 2 packs per day and is now down to at most 10 cigarettes per day.  - Congratulated patient on the progress he has made so far and encouraged him to continue to work towards complete cessation. He has Nicotine  patches at home and is very motivated to quit.  ***   Fatigue Snoring Patient reported fatigue and snoring at last visit in 07/2024. Itmar home sleep study was ordered ***  EKGs/Labs/Other Studies Reviewed:    The following studies were reviewed:  Echocardiogram 07/09/2024: Impressions: 1. Left ventricular ejection fraction, by estimation, is 60 to 65%. The  left ventricle has normal function. The left ventricle demonstrates  regional wall motion abnormalities (see scoring diagram/findings for  description). There is mild asymmetric left  ventricular hypertrophy of the septal segment. Left ventricular diastolic  parameters are  consistent with Grade I diastolic dysfunction (impaired  relaxation).   2. Right ventricular systolic function is normal. The right ventricular  size is normal. Tricuspid regurgitation signal is inadequate for assessing  PA pressure.   3. Mild chordal SAM. The mitral valve is normal in structure. Trivial  mitral valve regurgitation. No evidence of mitral stenosis.   4. The aortic valve is normal in structure. Aortic valve regurgitation is  not visualized. No aortic stenosis is present.   5. The inferior vena cava is dilated in size with <50% respiratory  variability, suggesting right atrial pressure of 15 mmHg.  _______________   Left Cardiac Catheterization 07/10/2024: Conclusions: Severe single-vessel coronary artery disease with 95% mid LAD stenosis involving moderate-caliber D2 branch with acute angulation.  There is also 50% proximal LAD disease as well as 30% OM2 and 40% RCA stenoses. Mildly elevated left ventricular filling pressure (LVEDP 20 mmHg). Complex PCI to proximal/mid LAD and D2 using crush technique (Onyx Frontier 3.0 x 34 mm LAD and 2.25 x 12 mm D2 drug-eluting stents) with 0% residual stenosis in the LAD and less than 10% residual stenosis in ostium of D2 with TIMI-3 flow throughout the left coronary artery.   Recommendations: Continue cangrelor  infusion for 2 hours following administration of ticagrelor  at the end of the intervention. Dual antiplatelet therapy with aspirin  and ticagrelor  for at least 12 months. Aggressive secondary prevention of coronary artery disease, including high intensity statin therapy and smoking cessation. Diagnostic Dominance: Right  Intervention       EKG:  EKG not ordered today.   Recent Labs: 04/04/2024: TSH 3.150 07/08/2024: ALT 50 07/16/2024: Hemoglobin 16.6; Platelets 322 07/21/2024: BUN 21; Creatinine, Ser 1.18; Potassium 4.5; Sodium 138  Recent Lipid Panel    Component Value Date/Time   CHOL 242 (H) 07/08/2024 2128   CHOL  221 (H) 09/26/2021 0820   TRIG 175 (H) 07/08/2024 2128   HDL 41 07/08/2024 2128   HDL 37 (L) 09/26/2021 0820   CHOLHDL 5.9 07/08/2024 2128   VLDL 35 07/08/2024 2128   LDLCALC 166 (H) 07/08/2024 2128   LDLCALC 143 (H) 09/26/2021 0820    Physical Exam:    Vital Signs: There were no vitals taken for this visit.    Wt Readings from Last 3 Encounters:  08/30/24 208 lb 8.9 oz (94.6 kg)  08/17/24 212 lb (96.2 kg)  07/21/24 209 lb (94.8 kg)     General: 59 y.o. male in no acute distress. HEENT: Normocephalic and atraumatic. Sclera clear.  Neck: Supple. No carotid bruits. No JVD. Heart: *** RRR. Distinct S1 and S2. No murmurs, gallops, or rubs.  Lungs: No increased work of breathing. Clear to ausculation bilaterally. No wheezes, rhonchi, or rales.  Abdomen: Soft, non-distended, and non-tender to palpation.  Extremities: No lower extremity edema.  Radial and distal pedal pulses 2+ and equal bilaterally. Skin: Warm and dry. Neuro: No focal deficits. Psych: Normal affect. Responds appropriately.   Assessment:    No diagnosis found.  Plan:     Disposition: Follow up in ***   Signed, Ezri Fanguy E Lola Lofaro, PA-C  10/13/2024 1:30 PM  [facility] "

## 2024-10-17 ENCOUNTER — Encounter (HOSPITAL_COMMUNITY)

## 2024-10-19 ENCOUNTER — Encounter (HOSPITAL_COMMUNITY)

## 2024-10-20 ENCOUNTER — Other Ambulatory Visit (HOSPITAL_COMMUNITY): Payer: Self-pay

## 2024-10-24 ENCOUNTER — Encounter (HOSPITAL_COMMUNITY)

## 2024-10-26 ENCOUNTER — Ambulatory Visit: Admitting: Student

## 2024-10-26 ENCOUNTER — Encounter (HOSPITAL_COMMUNITY)

## 2024-10-31 ENCOUNTER — Encounter (HOSPITAL_COMMUNITY)
